# Patient Record
Sex: Male | Born: 1960 | Race: Black or African American | Hispanic: No | Smoking: Former smoker
Health system: Southern US, Community
[De-identification: ages and names within clinical notes are randomized; demographics above are authoritative.]

## PROBLEM LIST (undated history)

## (undated) DIAGNOSIS — I10 Essential (primary) hypertension: Secondary | ICD-10-CM

## (undated) DIAGNOSIS — M109 Gout, unspecified: Secondary | ICD-10-CM

## (undated) DIAGNOSIS — M869 Osteomyelitis, unspecified: Secondary | ICD-10-CM

## (undated) DIAGNOSIS — M1990 Unspecified osteoarthritis, unspecified site: Secondary | ICD-10-CM

## (undated) DIAGNOSIS — F4310 Post-traumatic stress disorder, unspecified: Secondary | ICD-10-CM

## (undated) DIAGNOSIS — Z9289 Personal history of other medical treatment: Secondary | ICD-10-CM

## (undated) DIAGNOSIS — G629 Polyneuropathy, unspecified: Secondary | ICD-10-CM

## (undated) DIAGNOSIS — E785 Hyperlipidemia, unspecified: Secondary | ICD-10-CM

## (undated) HISTORY — PX: JOINT REPLACEMENT: SHX530

## (undated) HISTORY — PX: FRACTURE SURGERY: SHX138

## (undated) HISTORY — PX: DG FOOT HEEL (ARMC HX): HXRAD1517

---

## 1982-08-04 DIAGNOSIS — Z9289 Personal history of other medical treatment: Secondary | ICD-10-CM

## 1982-08-04 HISTORY — DX: Personal history of other medical treatment: Z92.89

## 1983-01-03 HISTORY — PX: ENUCLEATION: SHX628

## 2004-04-20 ENCOUNTER — Emergency Department (HOSPITAL_COMMUNITY): Admission: EM | Admit: 2004-04-20 | Discharge: 2004-04-20 | Payer: Self-pay | Admitting: Emergency Medicine

## 2008-12-08 ENCOUNTER — Emergency Department (HOSPITAL_COMMUNITY): Admission: EM | Admit: 2008-12-08 | Discharge: 2008-12-08 | Payer: Self-pay | Admitting: Emergency Medicine

## 2013-10-04 ENCOUNTER — Other Ambulatory Visit (HOSPITAL_COMMUNITY): Payer: Self-pay | Admitting: *Deleted

## 2013-10-04 ENCOUNTER — Ambulatory Visit (HOSPITAL_COMMUNITY)
Admission: RE | Admit: 2013-10-04 | Discharge: 2013-10-04 | Disposition: A | Discharge status: Home / Self Care | Payer: Medicaid Other | Source: Ambulatory Visit | Attending: *Deleted | Admitting: *Deleted

## 2013-10-04 DIAGNOSIS — M25551 Pain in right hip: Secondary | ICD-10-CM

## 2013-10-04 DIAGNOSIS — M169 Osteoarthritis of hip, unspecified: Secondary | ICD-10-CM | POA: Insufficient documentation

## 2013-10-04 DIAGNOSIS — M1610 Unilateral primary osteoarthritis, unspecified hip: Secondary | ICD-10-CM | POA: Insufficient documentation

## 2014-04-27 ENCOUNTER — Encounter (HOSPITAL_COMMUNITY): Payer: Self-pay | Admitting: Emergency Medicine

## 2014-04-27 ENCOUNTER — Emergency Department (HOSPITAL_COMMUNITY)
Admission: EM | Admit: 2014-04-27 | Discharge: 2014-04-27 | Disposition: A | Discharge status: Home / Self Care | Payer: Medicaid Other | Attending: Emergency Medicine | Admitting: Emergency Medicine

## 2014-04-27 DIAGNOSIS — Z792 Long term (current) use of antibiotics: Secondary | ICD-10-CM | POA: Diagnosis not present

## 2014-04-27 DIAGNOSIS — Z8659 Personal history of other mental and behavioral disorders: Secondary | ICD-10-CM | POA: Insufficient documentation

## 2014-04-27 DIAGNOSIS — M129 Arthropathy, unspecified: Secondary | ICD-10-CM | POA: Insufficient documentation

## 2014-04-27 DIAGNOSIS — E860 Dehydration: Secondary | ICD-10-CM | POA: Insufficient documentation

## 2014-04-27 DIAGNOSIS — I10 Essential (primary) hypertension: Secondary | ICD-10-CM | POA: Diagnosis not present

## 2014-04-27 DIAGNOSIS — K089 Disorder of teeth and supporting structures, unspecified: Secondary | ICD-10-CM | POA: Diagnosis present

## 2014-04-27 DIAGNOSIS — K047 Periapical abscess without sinus: Secondary | ICD-10-CM | POA: Insufficient documentation

## 2014-04-27 DIAGNOSIS — Z79899 Other long term (current) drug therapy: Secondary | ICD-10-CM | POA: Insufficient documentation

## 2014-04-27 DIAGNOSIS — F17200 Nicotine dependence, unspecified, uncomplicated: Secondary | ICD-10-CM | POA: Diagnosis not present

## 2014-04-27 DIAGNOSIS — E785 Hyperlipidemia, unspecified: Secondary | ICD-10-CM | POA: Insufficient documentation

## 2014-04-27 HISTORY — DX: Unspecified osteoarthritis, unspecified site: M19.90

## 2014-04-27 LAB — BASIC METABOLIC PANEL
Anion gap: 15 (ref 5–15)
BUN: 16 mg/dL (ref 6–23)
CALCIUM: 9.5 mg/dL (ref 8.4–10.5)
CO2: 29 mEq/L (ref 19–32)
CREATININE: 1.44 mg/dL — AB (ref 0.50–1.35)
Chloride: 92 mEq/L — ABNORMAL LOW (ref 96–112)
GFR calc non Af Amer: 54 mL/min — ABNORMAL LOW (ref >90)
GFR, EST AFRICAN AMERICAN: 63 mL/min — AB (ref >90)
Glucose, Bld: 89 mg/dL (ref 70–99)
POTASSIUM: 3.5 meq/L — AB (ref 3.7–5.3)
SODIUM: 136 meq/L — AB (ref 137–147)

## 2014-04-27 LAB — CBC WITH DIFFERENTIAL/PLATELET
Basophils Absolute: 0 10*3/uL (ref 0.0–0.1)
Basophils Relative: 0 % (ref 0–1)
EOS ABS: 0 10*3/uL (ref 0.0–0.7)
EOS PCT: 0 % (ref 0–5)
HCT: 35.5 % — ABNORMAL LOW (ref 39.0–52.0)
Hemoglobin: 12.9 g/dL — ABNORMAL LOW (ref 13.0–17.0)
LYMPHS ABS: 1.6 10*3/uL (ref 0.7–4.0)
LYMPHS PCT: 8 % — AB (ref 12–46)
MCH: 32.3 pg (ref 26.0–34.0)
MCHC: 36.3 g/dL — AB (ref 30.0–36.0)
MCV: 89 fL (ref 78.0–100.0)
Monocytes Absolute: 2.1 10*3/uL — ABNORMAL HIGH (ref 0.1–1.0)
Monocytes Relative: 10 % (ref 3–12)
NEUTROS ABS: 17.6 10*3/uL — AB (ref 1.7–7.7)
NEUTROS PCT: 82 % — AB (ref 43–77)
Platelets: 209 10*3/uL (ref 150–400)
RBC: 3.99 MIL/uL — ABNORMAL LOW (ref 4.22–5.81)
RDW: 12.4 % (ref 11.5–15.5)
WBC: 21.4 10*3/uL — AB (ref 4.0–10.5)

## 2014-04-27 LAB — LACTIC ACID, PLASMA: Lactic Acid, Venous: 0.9 mmol/L (ref 0.5–2.2)

## 2014-04-27 MED ORDER — HYDROCODONE-ACETAMINOPHEN 5-325 MG PO TABS: 1.0000 | ORAL_TABLET | Freq: Four times a day (QID) | ORAL | Status: DC | PRN

## 2014-04-27 MED ORDER — CLINDAMYCIN PHOSPHATE 900 MG/50ML IV SOLN
900.0000 mg | Freq: Once | INTRAVENOUS | Status: AC
  Administered 2014-04-27: 900 mg via INTRAVENOUS
  Filled 2014-04-27: qty 50

## 2014-04-27 MED ORDER — FENTANYL CITRATE 0.05 MG/ML IJ SOLN
50.0000 ug | Freq: Once | INTRAMUSCULAR | Status: AC
  Administered 2014-04-27: 50 ug via INTRAVENOUS
  Filled 2014-04-27: qty 2

## 2014-04-27 MED ORDER — ONDANSETRON HCL 4 MG/2ML IJ SOLN
4.0000 mg | Freq: Once | INTRAMUSCULAR | Status: AC
  Administered 2014-04-27: 4 mg via INTRAVENOUS
  Filled 2014-04-27: qty 2

## 2014-04-27 MED ORDER — SODIUM CHLORIDE 0.9 % IV SOLN
1000.0000 mL | INTRAVENOUS | Status: DC
  Administered 2014-04-27: 1000 mL via INTRAVENOUS

## 2014-04-27 MED ORDER — CLINDAMYCIN HCL 300 MG PO CAPS: 300.0000 mg | ORAL_CAPSULE | Freq: Four times a day (QID) | ORAL | Status: DC

## 2014-04-27 MED ORDER — SODIUM CHLORIDE 0.9 % IV BOLUS (SEPSIS): 1000.0000 mL | Freq: Once | INTRAVENOUS | Status: DC

## 2014-04-27 MED ORDER — SODIUM CHLORIDE 0.9 % IV SOLN
1000.0000 mL | Freq: Once | INTRAVENOUS | Status: AC
  Administered 2014-04-27: 1000 mL via INTRAVENOUS

## 2014-04-27 MED ORDER — SODIUM CHLORIDE 0.9 % IV BOLUS (SEPSIS)
1000.0000 mL | Freq: Once | INTRAVENOUS | Status: AC
  Administered 2014-04-27: 1000 mL via INTRAVENOUS

## 2014-04-27 NOTE — ED Provider Notes (Addendum)
CSN: [id number]     Arrival date & time 04/27/14  1445 History   First MD Initiated Contact with Patient 04/27/14 1506     Chief Complaint  Patient presents with  . Wound Infection  . Dental Pain     (Consider location/radiation/quality/duration/timing/severity/associated sxs/prior Treatment) HPI Patient reports he started having problems with his right lower tooth 5 days ago. He relates 4 days ago he started getting more painful and he had swelling up in his cheek area that has gotten progressively worse. He states today the swelling is more down around his jaw line. He has had chills without documented fever. He states it's painful to swallow however he has no trouble breathing. He reports difficulty opening up his mouth and chewing. He states he went to see his dentist today, Dr Judkins and was told to come to the ED to get IV antibiotics because he had poison in his system.  PCP [facility]  Past Medical History  Diagnosis Date  . Arthritis    PTSD Hypertension High cholesterol   Past Surgical History  Procedure Laterality Date  . Eye surgery      pt had right eye removed   No family history on file. History  Substance Use Topics  . Smoking status: Current Every Day Smoker -- 0.50 packs/day    Types: Cigarettes  . Smokeless tobacco: Never Used  . Alcohol Use: No  employed Smokes 7 cigs a day  Review of Systems  All other systems reviewed and are negative.     Allergies  Review of patient's allergies indicates no known allergies.  Home Medications   Prior to Admission medications   Medication Sig Start Date End Date Taking? Authorizing Provider  doxepin (SINEQUAN) 25 MG capsule Take 25 mg by mouth at bedtime.   Yes Historical Provider, MD  ibuprofen (ADVIL,MOTRIN) 800 MG tablet Take 800 mg by mouth every 8 (eight) hours as needed for headache or moderate pain.   Yes Historical Provider, MD  lisinopril-hydrochlorothiazide  (PRINZIDE,ZESTORETIC) 20-12.5 MG per tablet Take 1 tablet by mouth daily.   Yes Historical Provider, MD  pravastatin (PRAVACHOL) 20 MG tablet Take 20 mg by mouth at bedtime.   Yes Historical Provider, MD  clindamycin (CLEOCIN) 300 MG capsule Take 1 capsule (300 mg total) by mouth 4 (four) times daily. 04/27/14   Ward Givens, MD  HYDROcodone-acetaminophen (NORCO/VICODIN) 5-325 MG per tablet Take 1-2 tablets by mouth every 6 (six) hours as needed for moderate pain. 04/27/14   Ward Givens, MD   BP 94/61  Pulse 112  Temp(Src) 99.3 F (37.4 C) (Oral)  Resp 18  Ht 6' (1.829 m)  Wt 243 lb (110.224 kg)  BMI 32.95 kg/m2  SpO2 98%  Vital signs normal except for tachycardia and mild hypotension  Orthostatic Vital Signs after first liter of NS Orthostatic Lying - BP- Lying: 114/77 mmHg ; Pulse- Lying: 104  Orthostatic Sitting - BP- Sitting: 110/88 mmHg ; Pulse- Sitting: 105  Orthostatic Standing at 0 minutes - BP- Standing at 0 minutes: 106/84 mmHg ; Pulse- Standing at 0 minutes: 110   Physical Exam  Nursing note and vitals reviewed. Constitutional: He is oriented to person, place, and time. He appears well-developed and well-nourished.  Non-toxic appearance. He does not appear ill. No distress.  HENT:  Head: Normocephalic and atraumatic.  Right Ear: External ear normal.  Left Ear: External ear normal.  Nose: Nose normal. No mucosal edema or rhinorrhea.  Mouth/Throat:  Mucous membranes are normal. No dental abscesses or uvula swelling.  Patient is unable to open his mouth widely, he is noted to have a large cavity of his right lower most posterior molar that has some mild tenderness to percussion. Patient's noted to have swelling of his right jaw line extending into the upper submandibular/neck area. He is not drooling, he is able to swallow his own saliva. His voice is normal.  Eyes: Conjunctivae are normal.  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 Neck supple.  Cardiovascular: Normal rate, regular rhythm and normal heart sounds.  Exam reveals no gallop and no friction rub.   No murmur heard. Pulmonary/Chest: Effort normal and breath sounds normal. No respiratory distress. He has no wheezes. He has no rhonchi. He has no rales. He exhibits no tenderness and no crepitus.  Abdominal: Soft. Normal appearance and bowel sounds are normal. He exhibits no distension. There is no tenderness. There is no rebound and no guarding.  Musculoskeletal: Normal range of motion. He exhibits no edema and no tenderness.  Moves all extremities well.   Neurological: He is alert and oriented to person, place, and time. He has normal strength. No cranial nerve deficit.  Skin: Skin is warm, dry and intact. No rash noted. No erythema. No pallor.  Psychiatric: He has a normal mood and affect. His speech is normal and behavior is normal. His mood appears not anxious.    ED Course  Procedures (including critical care time)  Medications  sodium chloride 0.9 % bolus 1,000 mL (1,000 mLs Intravenous Not Given 04/27/14 2015)  0.9 %  sodium chloride infusion (0 mLs Intravenous Stopped 04/27/14 1800)  fentaNYL (SUBLIMAZE) injection 50 mcg (50 mcg Intravenous Given 04/27/14 1625)  ondansetron (ZOFRAN) injection 4 mg (4 mg Intravenous Given 04/27/14 1625)  clindamycin (CLEOCIN) IVPB 900 mg (0 mg Intravenous Stopped 04/27/14 1655)  sodium chloride 0.9 % bolus 1,000 mL (0 mLs Intravenous Stopped 04/27/14 1904)  fentaNYL (SUBLIMAZE) injection 50 mcg (50 mcg Intravenous Given 04/27/14 2023)   Patient received IV fluids until his tachycardia and hypotension improved. Patient was able to eat and drink while in the ED.  Labs Review Results for orders placed during the hospital encounter of 04/27/14  CBC WITH DIFFERENTIAL      Result Value Ref Range   WBC 21.4 (*) 4.0 - 10.5 K/uL   RBC 3.99 (*) 4.22 - 5.81 MIL/uL   Hemoglobin 12.9 (*) 13.0 - 17.0 g/dL    HCT 16.1 (*) 09.6 - 52.0 %   MCV 89.0  78.0 - 100.0 fL   MCH 32.3  26.0 - 34.0 pg   MCHC 36.3 (*) 30.0 - 36.0 g/dL   RDW 04.5  40.9 - 81.1 %   Platelets 209  150 - 400 K/uL   Neutrophils Relative % 82 (*) 43 - 77 %   Neutro Abs 17.6 (*) 1.7 - 7.7 K/uL   Lymphocytes Relative 8 (*) 12 - 46 %   Lymphs Abs 1.6  0.7 - 4.0 K/uL   Monocytes Relative 10  3 - 12 %   Monocytes Absolute 2.1 (*) 0.1 - 1.0 K/uL   Eosinophils Relative 0  0 - 5 %   Eosinophils Absolute 0.0  0.0 - 0.7 K/uL   Basophils Relative 0  0 - 1 %   Basophils Absolute 0.0  0.0 - 0.1 K/uL  BASIC METABOLIC PANEL  Result Value Ref Range   Sodium 136 (*) 137 - 147 mEq/L   Potassium 3.5 (*) 3.7 - 5.3 mEq/L   Chloride 92 (*) 96 - 112 mEq/L   CO2 29  19 - 32 mEq/L   Glucose, Bld 89  70 - 99 mg/dL   BUN 16  6 - 23 mg/dL   Creatinine, Ser 1.61 (*) 0.50 - 1.35 mg/dL   Calcium 9.5  8.4 - 09.6 mg/dL   GFR calc non Af Amer 54 (*) >90 mL/min   GFR calc Af Amer 63 (*) >90 mL/min   Anion gap 15  5 - 15  LACTIC ACID, PLASMA      Result Value Ref Range   Lactic Acid, Venous 0.9  0.5 - 2.2 mmol/L   Laboratory interpretation all normal except leukocytosis, mild hypokalemia, low sodium and chloride consistent with dehydration      Imaging Review No results found.   EKG Interpretation None      MDM   Final diagnoses:  Abscessed tooth  Dehydration   New Prescriptions   CLINDAMYCIN (CLEOCIN) 300 MG CAPSULE    Take 1 capsule (300 mg total) by mouth 4 (four) times daily.   HYDROCODONE-ACETAMINOPHEN (NORCO/VICODIN) 5-325 MG PER TABLET    Take 1-2 tablets by mouth every 6 (six) hours as needed for moderate pain.    Plan discharge   I personally performed the services described in this documentation, which was scribed in my presence. The recorded information has been reviewed and considered.     Ward Givens, MD 04/27/14 2022  Ward Givens, MD 04/27/14 2138

## 2014-04-27 NOTE — Discharge Instructions (Signed)
Try warmth on your face and ice packs for pain. Take the hydrocodone for pain. Take the antibotics until gone. Return to the ED if your swelling gets worse, you have difficulty breathing or are unable to swallow. Follow up with your dentist after the antibiotics are finished to have definitive treatment of your bad tooth.    Dental Abscess A dental abscess is a collection of infected fluid (pus) from a bacterial infection in the inner part of the tooth (pulp). It usually occurs at the end of the tooth's root.  CAUSES   Severe tooth decay.  Trauma to the tooth that allows bacteria to enter into the pulp, such as a broken or chipped tooth. SYMPTOMS   Severe pain in and around the infected tooth.  Swelling and redness around the abscessed tooth or in the mouth or face.  Tenderness.  Pus drainage.  Bad breath.  Bitter taste in the mouth.  Difficulty swallowing.  Difficulty opening the mouth.  Nausea.  Vomiting.  Chills.  Swollen neck glands. DIAGNOSIS   A medical and dental history will be taken.  An examination will be performed by tapping on the abscessed tooth.  X-rays may be taken of the tooth to identify the abscess. TREATMENT The goal of treatment is to eliminate the infection. You may be prescribed antibiotic medicine to stop the infection from spreading. A root canal may be performed to save the tooth. If the tooth cannot be saved, it may be pulled (extracted) and the abscess may be drained.  HOME CARE INSTRUCTIONS  Only take over-the-counter or prescription medicines for pain, fever, or discomfort as directed by your caregiver.  Rinse your mouth (gargle) often with salt water ( tsp salt in 8 oz [250 ml] of warm water) to relieve pain or swelling.  Do not drive after taking pain medicine (narcotics).  Do not apply heat to the outside of your face.  Return to your dentist for further treatment as directed. SEEK MEDICAL CARE IF:  Your pain is not helped by  medicine.  Your pain is getting worse instead of better. SEEK IMMEDIATE MEDICAL CARE IF:  You have a fever or persistent symptoms for more than 2-3 days.  You have a fever and your symptoms suddenly get worse.  You have chills or a very bad headache.  You have problems breathing or swallowing.  You have trouble opening your mouth.  You have swelling in the neck or around the eye. Document Released: 07/21/2005 Document Revised: 04/14/2012 Document Reviewed: 10/29/2010 [facility] Patient Information 2015 [place], [place]. This information is not intended to replace advice given to you by your health care provider. Make sure you discuss any questions you have with your health care provider.

## 2014-04-27 NOTE — ED Notes (Signed)
Pt states that pain and swelling in mouth started on Monday evening. Pt. Stated that he went to the dentist today at 1421 and requested that he come to the emergency department to get antibiotics in a "drip".

## 2014-04-27 NOTE — ED Notes (Signed)
Pt was given a six pre-pack Vicodin 5-325 mg, given instructions on use, pt verbally understands.

## 2014-05-02 MED FILL — Hydrocodone-Acetaminophen Tab 5-325 MG: ORAL | Qty: 6 | Status: AC

## 2014-08-04 HISTORY — PX: TOTAL HIP ARTHROPLASTY: SHX124

## 2017-01-20 ENCOUNTER — Emergency Department (HOSPITAL_COMMUNITY): Payer: Medicaid Other

## 2017-01-20 ENCOUNTER — Encounter (HOSPITAL_COMMUNITY): Payer: Self-pay

## 2017-01-20 ENCOUNTER — Emergency Department (HOSPITAL_COMMUNITY)
Admission: EM | Admit: 2017-01-20 | Discharge: 2017-01-21 | Disposition: A | Discharge status: Home / Self Care | Payer: Medicaid Other | Attending: Emergency Medicine | Admitting: Emergency Medicine

## 2017-01-20 DIAGNOSIS — Z791 Long term (current) use of non-steroidal anti-inflammatories (NSAID): Secondary | ICD-10-CM | POA: Insufficient documentation

## 2017-01-20 DIAGNOSIS — W1789XA Other fall from one level to another, initial encounter: Secondary | ICD-10-CM | POA: Insufficient documentation

## 2017-01-20 DIAGNOSIS — F17210 Nicotine dependence, cigarettes, uncomplicated: Secondary | ICD-10-CM | POA: Diagnosis not present

## 2017-01-20 DIAGNOSIS — Y999 Unspecified external cause status: Secondary | ICD-10-CM | POA: Diagnosis not present

## 2017-01-20 DIAGNOSIS — S92014A Nondisplaced fracture of body of right calcaneus, initial encounter for closed fracture: Secondary | ICD-10-CM | POA: Insufficient documentation

## 2017-01-20 DIAGNOSIS — Z7982 Long term (current) use of aspirin: Secondary | ICD-10-CM | POA: Insufficient documentation

## 2017-01-20 DIAGNOSIS — Y939 Activity, unspecified: Secondary | ICD-10-CM | POA: Diagnosis not present

## 2017-01-20 DIAGNOSIS — S92001A Unspecified fracture of right calcaneus, initial encounter for closed fracture: Secondary | ICD-10-CM

## 2017-01-20 DIAGNOSIS — Z79899 Other long term (current) drug therapy: Secondary | ICD-10-CM | POA: Diagnosis not present

## 2017-01-20 DIAGNOSIS — Y929 Unspecified place or not applicable: Secondary | ICD-10-CM | POA: Insufficient documentation

## 2017-01-20 DIAGNOSIS — S99911A Unspecified injury of right ankle, initial encounter: Secondary | ICD-10-CM | POA: Diagnosis present

## 2017-01-20 NOTE — ED Triage Notes (Signed)
Reports of falling off porch this evening. Right ankle pain/swelling.

## 2017-01-21 ENCOUNTER — Telehealth: Payer: Self-pay | Admitting: Orthopaedic Surgery

## 2017-01-21 MED ORDER — HYDROCODONE-ACETAMINOPHEN 5-325 MG PO TABS: 2.0000 | ORAL_TABLET | ORAL | 0 refills | Status: DC | PRN

## 2017-01-21 MED ORDER — HYDROCODONE-ACETAMINOPHEN 5-325 MG PO TABS
1.0000 | ORAL_TABLET | Freq: Once | ORAL | Status: AC
  Administered 2017-01-21: 1 via ORAL
  Filled 2017-01-21: qty 1

## 2017-01-21 NOTE — Discharge Instructions (Signed)
Take pain medication as needed. Use splint as directed. Use crutches as directed. Follow-up with orthopedics for further evaluation use information listed below. Return to ED for worsening pain, additional injury, numbness, weakness, headache injury, fever.

## 2017-01-21 NOTE — ED Provider Notes (Signed)
[facility]EM[facility]Y DEPT Provider Note   CSN: [id number] Arrival date & time: 01/20/17  2148     History   Chief Complaint Chief Complaint  Patient presents with  . Ankle Pain    HPI Charles Blankenship is a 56 y.o. male.  HPI  Patient presents to ED for complaints of right foot pain that began about 4 hours PTA. He states that he tripped off of his porch and twisted his right ankle and landed on his feet. Since then he has pain with weightbearing, increased swelling. States the pain is on his ankle and bilateral of his foot. States that ibuprofen has not helped with pain. He denies any previous injury or procedure in this foot. He denies any back pain, back injury, urinary incontinence, numbness, weakness, temperature changes, head injury, headache, vision changes, chest pain or trouble breathing.  Past Medical History:  Diagnosis Date  . Arthritis     There are no active problems to display for this patient.   Past Surgical History:  Procedure Laterality Date  . EYE SURGERY     pt had right eye removed       Home Medications    Prior to Admission medications   Medication Sig Start Date End Date Taking? Authorizing Provider  aspirin EC 81 MG tablet Take 81 mg by mouth daily.   Yes [provider]  doxepin (SINEQUAN) 25 MG capsule Take 25 mg by mouth at bedtime.   Yes [provider]  ibuprofen (ADVIL,MOTRIN) 800 MG tablet Take 800 mg by mouth every 8 (eight) hours as needed for headache or moderate pain.   Yes [provider]  lisinopril-hydrochlorothiazide (PRINZIDE,ZESTORETIC) 20-12.5 MG per tablet Take 1 tablet by mouth daily.   Yes [provider]  Multiple Vitamin (MULTIVITAMIN WITH MINERALS) TABS tablet Take 1 tablet by mouth daily.   Yes [provider]  pravastatin (PRAVACHOL) 20 MG tablet Take 20 mg by mouth at bedtime.   Yes [provider]  vitamin B-12 (CYANOCOBALAMIN) 500 MCG tablet Take 500 mcg by mouth  daily.   Yes [provider]  HYDROcodone-acetaminophen (NORCO/VICODIN) 5-325 MG tablet Take 2 tablets by mouth every 4 (four) hours as needed. 01/21/17   Dietrich PatesKhatri, Melanie Openshaw, PA-C    Family History No family history on file.  Social History Social History  Substance Use Topics  . Smoking status: Current Every Day Smoker    Packs/day: 0.50    Types: Cigarettes  . Smokeless tobacco: Never Used  . Alcohol use No     Allergies   Patient has no known allergies.   Review of Systems Review of Systems  Constitutional: Negative for chills and fever.  Eyes: Negative for photophobia and visual disturbance.  Respiratory: Negative for shortness of breath.   Cardiovascular: Negative for chest pain.  Gastrointestinal: Negative for abdominal pain, diarrhea, nausea and vomiting.  Musculoskeletal: Positive for arthralgias and myalgias. Negative for back pain, gait problem and neck pain.  Skin: Negative for rash and wound.  Neurological: Negative for dizziness, syncope, weakness, light-headedness and headaches.     Physical Exam Updated Vital Signs BP (!) 158/98 (BP Location: Right Arm)   Pulse 96   Temp 98.9 F (37.2 C) (Oral)   Resp 17   Ht 6' (1.829 m)   Wt 105.2 kg (232 lb)   SpO2 98%   BMI 31.46 kg/m   Physical Exam  Constitutional: He appears well-developed and well-nourished. No distress.  HENT:  Head: Normocephalic and atraumatic.  Eyes: Conjunctivae  and EOM are normal. No scleral icterus.  Neck: Normal range of motion.  Pulmonary/Chest: Effort normal. No respiratory distress.  Musculoskeletal: He exhibits edema and tenderness. He exhibits no deformity.  Tenderness over the right heel, medial ankle and great MTP joint. There is significant edema noted throughout the foot and ankle. 2+ DP pulses bilaterally. Sensation intact to light touch. There is no calf tenderness present. No temperature change or visible wound noted. No palpable deformity noted. Pain with inversion  and eversion of the ankle. Patient able to move all toes.  No lumbar or thoracic midline spinal tenderness present. No visible bruising or step-off palpated.  Neurological: He is alert.  Skin: No rash noted. He is not diaphoretic.  Psychiatric: He has a normal mood and affect.  Nursing note and vitals reviewed.    ED Treatments / Results  Labs (all labs ordered are listed, but only abnormal results are displayed) Labs Reviewed - No data to display  EKG  EKG Interpretation None       Radiology Dg Ankle Complete Right  Result Date: 01/20/2017 CLINICAL DATA:  Twisted ankle tonight.  Fell off porch. EXAM: RIGHT ANKLE - COMPLETE 3+ VIEW COMPARISON:  None. FINDINGS: Acute comminuted calcaneal fracture with intra-articular extension and mild impaction. Ankle mortise appears contour and tibiofibular syndesmosis intact. Tiny corticated bony fragment inferior to the medial malleolus. No destructive bony lesions. Soft tissue swelling without subcutaneous gas or radiopaque foreign bodies. IMPRESSION: Acute comminuted calcaneal fracture. Age indeterminate medial malleolar avulsion fracture. Electronically Signed   By: Awilda Metro M.D.   On: 01/20/2017 22:34   Dg Foot Complete Right  Result Date: 01/21/2017 CLINICAL DATA:  Calcaneal fracture noted on ankle radiographs. Further evaluation requested. Right medial ankle pain and swelling. Status post fall off porch. Initial encounter. EXAM: RIGHT FOOT COMPLETE - 3+ VIEW COMPARISON:  None. FINDINGS: There is a comminuted tongue-type fracture of the calcaneus, with disruption of the subtalar joint. No additional fractures are seen. There is chronic deformity of the second metatarsal. Mild soft tissue swelling is noted about the heel. IMPRESSION: Comminuted tongue-type fracture of the calcaneus, with underlying disruption of the subtalar joint. Electronically Signed   By: Roanna Raider M.D.   On: 01/21/2017 00:24    Procedures Procedures  (including critical care time)  Medications Ordered in ED Medications  HYDROcodone-acetaminophen (NORCO/VICODIN) 5-325 MG per tablet 1 tablet (1 tablet Oral Given 01/21/17 0051)     Initial Impression / Assessment and Plan / ED Course  I have reviewed the triage vital signs and the nursing notes.  Pertinent labs & imaging results that were available during my care of the patient were reviewed by me and considered in my medical decision making (see chart for details).     Patient presents to ED for right ankle and heel pain that began after twisting his ankle after tripping off of his porch. He states that he has had increased pain and swelling since incident occurred. On physical exam his foot has significant edema and tenderness over the heel, ankle and first MTP joint. X-ray of the foot and ankle were obtained and showed acute comminuted calcaneal fracture. This is a closed fracture. There are no visible signs of bruising or temperature change noted. No calf tenderness that would suggest DVT. No concern for septic joint. Patient is afebrile with no history of fever. He has pain with range of motion of the ankle. Pulses intact. Sensation intact to light touch. Area appears neurovascularly intact. Patient does  not complain of back pain and denies any back injury. There is no midline lumbar or thoracic spine tenderness and no visible bruising present. No need for imaging of the back at this time. No objective signs of numbness noted and patient denies numbness, bladder incontinence. Pain control here in the ED. Will give posterior splint for support and pain medication. I advised follow-up with orthopedics for further evaluation. Strict return precautions given.  Final Clinical Impressions(s) / ED Diagnoses   Final diagnoses:  Closed nondisplaced fracture of right calcaneus, unspecified portion of calcaneus, initial encounter    New Prescriptions New Prescriptions   HYDROCODONE-ACETAMINOPHEN  (NORCO/VICODIN) 5-325 MG TABLET    Take 2 tablets by mouth every 4 (four) hours as needed.     Dietrich Pates, PA-C 01/21/17 0132    Zadie Rhine, MD 01/21/17 [phone]

## 2017-01-21 NOTE — Telephone Encounter (Signed)
Patient and wife called this morning following [facility] yesterday, 01/20/17, for problem of foot/ankle fractures. Per chart notes - Xrays indicate comminuted tongue-type fracture of the calcaneus with disruption of the subtalar joint, foot; and acute comminuted calcaneal fracture w/ intra-articular extension, right ankle.  Per our provider Dr Hilda LiasKeeling, recommends patient referral directly to orthopaedic foot/ankle surgeon.  Relayed this information to patient. Voices understanding. Patient is also [facility], 3:30p.m, in order to have referral made to specialist (foot/ankle surgeon) as per insurance requirement.

## 2017-01-22 ENCOUNTER — Encounter (INDEPENDENT_AMBULATORY_CARE_PROVIDER_SITE_OTHER): Payer: Self-pay | Admitting: Orthopedic Surgery

## 2017-01-22 ENCOUNTER — Ambulatory Visit (INDEPENDENT_AMBULATORY_CARE_PROVIDER_SITE_OTHER): Payer: Medicaid Other | Admitting: Orthopedic Surgery

## 2017-01-22 ENCOUNTER — Encounter (HOSPITAL_COMMUNITY): Payer: Self-pay | Admitting: *Deleted

## 2017-01-22 ENCOUNTER — Other Ambulatory Visit (INDEPENDENT_AMBULATORY_CARE_PROVIDER_SITE_OTHER): Payer: Self-pay | Admitting: Orthopedic Surgery

## 2017-01-22 VITALS — Ht 72.0 in | Wt 232.0 lb

## 2017-01-22 DIAGNOSIS — S92011A Displaced fracture of body of right calcaneus, initial encounter for closed fracture: Secondary | ICD-10-CM | POA: Diagnosis not present

## 2017-01-22 MED ORDER — OXYCODONE-ACETAMINOPHEN 5-325 MG PO TABS: 1.0000 | ORAL_TABLET | ORAL | 0 refills | Status: DC | PRN

## 2017-01-22 NOTE — Progress Notes (Addendum)
Office Visit Note   Patient: Charles Blankenship           Date of Birth: Dec 23, 1960           MRN: [id number] Visit Date: 01/22/2017              Requested by: Tylene Fantasia., PA-C [address] [place], [place] [address] PCP: Tylene Fantasia., PA-C  Chief Complaint  Patient presents with  . Right Foot - Injury    DOI 01/20/17 to ER post twist injury comminuted right calcaneal fracture.       HPI: Patient is a 56 year old gentleman who states he fell from a ladder approximately 8 feet up and landed directly on his right heel. Patient complains of immediate onset of pain he was seen in the emergency room on 01/20/2017 radiographs were obtained and patient is seen today for initial evaluation.  Of note patient does smoke negative history for diabetes. Patient also has high cholesterol hypertension and gout.  Assessment & Plan: Visit Diagnoses:  1. 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 Discussed risks and benefits of surgery with the risks including infection neurovascular injury numbness arthritis need for additional surgery. Discussed the absolute need to stop smoking today. Discussed that with smoking he is at increased risk of wound dehiscence and an increased risk of a below knee amputation with amputation of his foot. Discussed the importance of elevation of his foot level with his heart using ice to decrease swelling recommended against nicotine products. Patient states he understands and wishes to proceed with surgery.  Follow-Up Instructions: Return in about 1 week (around 01/29/2017).   Ortho Exam  Patient is alert, oriented, no adenopathy, well-dressed, normal affect, normal respiratory effort. Examination patient is nonweightbearing on  the right. He does have swelling and ecchymosis and bruising laterally. No signs of a compartment syndrome. He has a palpable dorsalis pedis pulse. Radiographs are reviewed which shows a tong type fracture with complete displacement of the posterior facet. There is also involvement extends distally to the calcaneal cuboid joint.  Imaging: No results found.  Labs: No results found for: HGBA1C, ESRSEDRATE, CRP, LABURIC, REPTSTATUS, GRAMSTAIN, CULT, LABORGA  Orders:  No orders of the defined types were placed in this encounter.  Meds ordered this encounter  Medications  . oxyCODONE-acetaminophen (PERCOCET/ROXICET) 5-325 MG tablet    Sig: Take 1 tablet by mouth every 4 (four) hours as needed for severe pain.    Dispense:  60 tablet    Refill:  0     Procedures: No procedures performed  Clinical Data: No additional findings.  ROS:  All other systems negative, except as noted in the HPI. Review of Systems  Objective: Vital Signs: Ht 6' (1.829 m)   Wt 232 lb (105.2 kg)   BMI 31.46 kg/m   Specialty Comments:  No specialty comments available.  PMFS History: There are no active problems to display for this patient.  Past Medical History:  Diagnosis Date  . Arthritis     No family history on file.  Past Surgical History:  Procedure Laterality Date  . EYE SURGERY     pt had right eye removed   Social History   Occupational History  . Not on file.   Social History Main Topics  .  Smoking status: Current Every Day Smoker    Packs/day: 0.50    Types: Cigarettes  . Smokeless tobacco: Never Used  . Alcohol use No  . Drug use: No  . Sexual activity: Yes

## 2017-01-22 NOTE — Progress Notes (Signed)
Mr Charles Blankenship denies chest pain or shortness of breath.  PCP [place] Kizz[place] [facility]helle Muse , [place]  I requested last office note an EKG from the office.

## 2017-01-22 NOTE — Addendum Note (Signed)
Addended by: Aldean BakerUDA, MARCUS on: 01/22/2017 10:33 AM   Modules accepted: Orders

## 2017-01-23 ENCOUNTER — Ambulatory Visit (HOSPITAL_COMMUNITY): Payer: Medicaid Other | Admitting: Certified Registered"

## 2017-01-23 ENCOUNTER — Encounter (HOSPITAL_COMMUNITY): Payer: Self-pay | Admitting: *Deleted

## 2017-01-23 ENCOUNTER — Ambulatory Visit (HOSPITAL_COMMUNITY)
Admission: RE | Admit: 2017-01-23 | Discharge: 2017-01-23 | Disposition: A | Discharge status: Home / Self Care | Payer: Medicaid Other | Source: Ambulatory Visit | Attending: Orthopedic Surgery | Admitting: Orthopedic Surgery

## 2017-01-23 ENCOUNTER — Encounter (HOSPITAL_COMMUNITY)
Admission: RE | Disposition: A | Discharge status: Home / Self Care | Payer: Self-pay | Source: Ambulatory Visit | Attending: Orthopedic Surgery

## 2017-01-23 ENCOUNTER — Other Ambulatory Visit: Payer: Self-pay

## 2017-01-23 DIAGNOSIS — Z96641 Presence of right artificial hip joint: Secondary | ICD-10-CM | POA: Diagnosis not present

## 2017-01-23 DIAGNOSIS — S92011A Displaced fracture of body of right calcaneus, initial encounter for closed fracture: Secondary | ICD-10-CM

## 2017-01-23 DIAGNOSIS — Z79899 Other long term (current) drug therapy: Secondary | ICD-10-CM | POA: Diagnosis not present

## 2017-01-23 DIAGNOSIS — E785 Hyperlipidemia, unspecified: Secondary | ICD-10-CM | POA: Insufficient documentation

## 2017-01-23 DIAGNOSIS — W11XXXA Fall on and from ladder, initial encounter: Secondary | ICD-10-CM | POA: Diagnosis not present

## 2017-01-23 DIAGNOSIS — F17200 Nicotine dependence, unspecified, uncomplicated: Secondary | ICD-10-CM | POA: Insufficient documentation

## 2017-01-23 DIAGNOSIS — S92001A Unspecified fracture of right calcaneus, initial encounter for closed fracture: Secondary | ICD-10-CM | POA: Insufficient documentation

## 2017-01-23 DIAGNOSIS — I10 Essential (primary) hypertension: Secondary | ICD-10-CM | POA: Insufficient documentation

## 2017-01-23 HISTORY — DX: Hyperlipidemia, unspecified: E78.5

## 2017-01-23 HISTORY — DX: Essential (primary) hypertension: I10

## 2017-01-23 HISTORY — PX: ORIF CALCANEOUS FRACTURE: SHX5030

## 2017-01-23 HISTORY — DX: Gout, unspecified: M10.9

## 2017-01-23 LAB — COMPREHENSIVE METABOLIC PANEL
ALK PHOS: 72 U/L (ref 38–126)
ALT: 37 U/L (ref 17–63)
ANION GAP: 9 (ref 5–15)
AST: 40 U/L (ref 15–41)
Albumin: 4.1 g/dL (ref 3.5–5.0)
BUN: 11 mg/dL (ref 6–20)
CALCIUM: 9.4 mg/dL (ref 8.9–10.3)
CO2: 26 mmol/L (ref 22–32)
CREATININE: 1 mg/dL (ref 0.61–1.24)
Chloride: 102 mmol/L (ref 101–111)
Glucose, Bld: 104 mg/dL — ABNORMAL HIGH (ref 65–99)
Potassium: 3.5 mmol/L (ref 3.5–5.1)
SODIUM: 137 mmol/L (ref 135–145)
Total Bilirubin: 0.8 mg/dL (ref 0.3–1.2)
Total Protein: 6.7 g/dL (ref 6.5–8.1)

## 2017-01-23 LAB — CBC
HCT: 32.7 % — ABNORMAL LOW (ref 39.0–52.0)
Hemoglobin: 11.1 g/dL — ABNORMAL LOW (ref 13.0–17.0)
MCH: 28.5 pg (ref 26.0–34.0)
MCHC: 33.9 g/dL (ref 30.0–36.0)
MCV: 83.8 fL (ref 78.0–100.0)
Platelets: 209 10*3/uL (ref 150–400)
RBC: 3.9 MIL/uL — AB (ref 4.22–5.81)
RDW: 15.1 % (ref 11.5–15.5)
WBC: 7 10*3/uL (ref 4.0–10.5)

## 2017-01-23 LAB — APTT: aPTT: 32 seconds (ref 24–36)

## 2017-01-23 LAB — PROTIME-INR
INR: 0.98
PROTHROMBIN TIME: 13 s (ref 11.4–15.2)

## 2017-01-23 SURGERY — OPEN REDUCTION INTERNAL FIXATION (ORIF) CALCANEOUS FRACTURE
Anesthesia: General | Laterality: Right

## 2017-01-23 MED ORDER — MIDAZOLAM HCL 2 MG/2ML IJ SOLN
2.0000 mg | Freq: Once | INTRAMUSCULAR | Status: AC
  Administered 2017-01-23: 2 mg via INTRAVENOUS
  Filled 2017-01-23: qty 2

## 2017-01-23 MED ORDER — 0.9 % SODIUM CHLORIDE (POUR BTL) OPTIME
TOPICAL | Status: DC | PRN
  Administered 2017-01-23: 1000 mL

## 2017-01-23 MED ORDER — PHENYLEPHRINE 40 MCG/ML (10ML) SYRINGE FOR IV PUSH (FOR BLOOD PRESSURE SUPPORT)
PREFILLED_SYRINGE | INTRAVENOUS | Status: AC
  Filled 2017-01-23: qty 10

## 2017-01-23 MED ORDER — CHLORHEXIDINE GLUCONATE 4 % EX LIQD: 60.0000 mL | Freq: Once | CUTANEOUS | Status: DC

## 2017-01-23 MED ORDER — PROMETHAZINE HCL 25 MG/ML IJ SOLN: 6.2500 mg | INTRAMUSCULAR | Status: DC | PRN

## 2017-01-23 MED ORDER — LACTATED RINGERS IV SOLN
INTRAVENOUS | Status: DC
  Administered 2017-01-23 (×2): via INTRAVENOUS

## 2017-01-23 MED ORDER — SUGAMMADEX SODIUM 200 MG/2ML IV SOLN
INTRAVENOUS | Status: DC | PRN
  Administered 2017-01-23: 200 mg via INTRAVENOUS

## 2017-01-23 MED ORDER — PHENYLEPHRINE 40 MCG/ML (10ML) SYRINGE FOR IV PUSH (FOR BLOOD PRESSURE SUPPORT)
PREFILLED_SYRINGE | INTRAVENOUS | Status: AC
Start: 2017-01-23 — End: 2017-01-23
  Filled 2017-01-23: qty 10

## 2017-01-23 MED ORDER — EPHEDRINE SULFATE 50 MG/ML IJ SOLN
INTRAMUSCULAR | Status: DC | PRN
  Administered 2017-01-23: 5 mg via INTRAVENOUS
  Administered 2017-01-23: 15 mg via INTRAVENOUS
  Administered 2017-01-23: 5 mg via INTRAVENOUS

## 2017-01-23 MED ORDER — PHENYLEPHRINE 40 MCG/ML (10ML) SYRINGE FOR IV PUSH (FOR BLOOD PRESSURE SUPPORT)
PREFILLED_SYRINGE | INTRAVENOUS | Status: DC | PRN
  Administered 2017-01-23: 120 ug via INTRAVENOUS
  Administered 2017-01-23: 160 ug via INTRAVENOUS
  Administered 2017-01-23: 120 ug via INTRAVENOUS
  Administered 2017-01-23: 80 ug via INTRAVENOUS
  Administered 2017-01-23: 120 ug via INTRAVENOUS

## 2017-01-23 MED ORDER — ONDANSETRON HCL 4 MG/2ML IJ SOLN
INTRAMUSCULAR | Status: DC | PRN
  Administered 2017-01-23: 4 mg via INTRAVENOUS

## 2017-01-23 MED ORDER — ROCURONIUM BROMIDE 10 MG/ML (PF) SYRINGE
PREFILLED_SYRINGE | INTRAVENOUS | Status: AC
  Filled 2017-01-23: qty 5

## 2017-01-23 MED ORDER — EPHEDRINE 5 MG/ML INJ
INTRAVENOUS | Status: AC
  Filled 2017-01-23: qty 10

## 2017-01-23 MED ORDER — HYDROMORPHONE HCL 1 MG/ML IJ SOLN: 0.2500 mg | INTRAMUSCULAR | Status: DC | PRN

## 2017-01-23 MED ORDER — LIDOCAINE 2% (20 MG/ML) 5 ML SYRINGE
INTRAMUSCULAR | Status: AC
  Filled 2017-01-23: qty 5

## 2017-01-23 MED ORDER — DEXAMETHASONE SODIUM PHOSPHATE 10 MG/ML IJ SOLN
INTRAMUSCULAR | Status: AC
  Filled 2017-01-23: qty 1

## 2017-01-23 MED ORDER — FENTANYL CITRATE (PF) 100 MCG/2ML IJ SOLN
100.0000 ug | Freq: Once | INTRAMUSCULAR | Status: AC
  Administered 2017-01-23: 100 ug via INTRAVENOUS
  Filled 2017-01-23: qty 2

## 2017-01-23 MED ORDER — FENTANYL CITRATE (PF) 100 MCG/2ML IJ SOLN
INTRAMUSCULAR | Status: AC
  Administered 2017-01-23: 100 ug via INTRAVENOUS
  Filled 2017-01-23: qty 2

## 2017-01-23 MED ORDER — ROCURONIUM BROMIDE 100 MG/10ML IV SOLN
INTRAVENOUS | Status: DC | PRN
  Administered 2017-01-23: 50 mg via INTRAVENOUS

## 2017-01-23 MED ORDER — PROPOFOL 10 MG/ML IV BOLUS
INTRAVENOUS | Status: DC | PRN
  Administered 2017-01-23: 200 mg via INTRAVENOUS

## 2017-01-23 MED ORDER — MIDAZOLAM HCL 2 MG/2ML IJ SOLN
INTRAMUSCULAR | Status: AC
  Administered 2017-01-23: 2 mg via INTRAVENOUS
  Filled 2017-01-23: qty 2

## 2017-01-23 MED ORDER — LIDOCAINE 2% (20 MG/ML) 5 ML SYRINGE
INTRAMUSCULAR | Status: DC | PRN
Start: 2017-01-23 — End: 2017-01-23
  Administered 2017-01-23: 40 mg via INTRAVENOUS

## 2017-01-23 MED ORDER — DEXTROSE 5 % IV SOLN
INTRAVENOUS | Status: DC | PRN
  Administered 2017-01-23: 100 ug/min via INTRAVENOUS

## 2017-01-23 MED ORDER — PROPOFOL 10 MG/ML IV BOLUS
INTRAVENOUS | Status: AC
  Filled 2017-01-23: qty 20

## 2017-01-23 MED ORDER — DEXAMETHASONE SODIUM PHOSPHATE 10 MG/ML IJ SOLN
INTRAMUSCULAR | Status: DC | PRN
  Administered 2017-01-23: 5 mg via INTRAVENOUS

## 2017-01-23 MED ORDER — CEFAZOLIN SODIUM-DEXTROSE 2-4 GM/100ML-% IV SOLN
2.0000 g | INTRAVENOUS | Status: AC
  Administered 2017-01-23: 2 g via INTRAVENOUS
  Filled 2017-01-23: qty 100

## 2017-01-23 MED ORDER — ROPIVACAINE HCL 5 MG/ML IJ SOLN
INTRAMUSCULAR | Status: DC | PRN
  Administered 2017-01-23: 20 mL via EPIDURAL

## 2017-01-23 MED ORDER — BUPIVACAINE-EPINEPHRINE (PF) 0.5% -1:200000 IJ SOLN
INTRAMUSCULAR | Status: DC | PRN
  Administered 2017-01-23: 25 mL via PERINEURAL

## 2017-01-23 MED ORDER — FENTANYL CITRATE (PF) 250 MCG/5ML IJ SOLN
INTRAMUSCULAR | Status: AC
  Filled 2017-01-23: qty 5

## 2017-01-23 SURGICAL SUPPLY — 54 items
BANDAGE ACE 4X5 VEL STRL LF (GAUZE/BANDAGES/DRESSINGS) IMPLANT
BANDAGE ACE 6X5 VEL STRL LF (GAUZE/BANDAGES/DRESSINGS) IMPLANT
BANDAGE ESMARK 6X9 LF (GAUZE/BANDAGES/DRESSINGS) IMPLANT
BIT DRILL LCP QC 2X140 (BIT) ×6 IMPLANT
BNDG COHESIVE 6X5 TAN STRL LF (GAUZE/BANDAGES/DRESSINGS) ×6 IMPLANT
BNDG ESMARK 6X9 LF (GAUZE/BANDAGES/DRESSINGS)
BNDG GAUZE STRTCH 6 (GAUZE/BANDAGES/DRESSINGS) ×9 IMPLANT
BONE CANC CHIPS 20CC PCAN1/4 (Bone Implant) ×3 IMPLANT
CHIPS CANC BONE 20CC PCAN1/4 (Bone Implant) ×1 IMPLANT
COTTON STERILE ROLL (GAUZE/BANDAGES/DRESSINGS) ×3 IMPLANT
COVER SURGICAL LIGHT HANDLE (MISCELLANEOUS) ×6 IMPLANT
CUFF TOURNIQUET SINGLE 34IN LL (TOURNIQUET CUFF) IMPLANT
CUFF TOURNIQUET SINGLE 44IN (TOURNIQUET CUFF) IMPLANT
DRAPE HALF SHEET 40X57 (DRAPES) ×3 IMPLANT
DRAPE INCISE IOBAN 66X45 STRL (DRAPES) ×3 IMPLANT
DRAPE OEC MINIVIEW 54X84 (DRAPES) ×3 IMPLANT
DRAPE U-SHAPE 47X51 STRL (DRAPES) ×3 IMPLANT
DRSG ADAPTIC 3X8 NADH LF (GAUZE/BANDAGES/DRESSINGS) ×3 IMPLANT
DURAPREP 26ML APPLICATOR (WOUND CARE) ×3 IMPLANT
ELECT REM PT RETURN 9FT ADLT (ELECTROSURGICAL) ×3
ELECTRODE REM PT RTRN 9FT ADLT (ELECTROSURGICAL) ×1 IMPLANT
GAUZE SPONGE 4X4 12PLY STRL (GAUZE/BANDAGES/DRESSINGS) ×3 IMPLANT
GLOVE BIOGEL PI IND STRL 9 (GLOVE) ×1 IMPLANT
GLOVE BIOGEL PI INDICATOR 9 (GLOVE) ×2
GLOVE SURG ORTHO 9.0 STRL STRW (GLOVE) ×3 IMPLANT
GOWN STRL REUS W/ TWL XL LVL3 (GOWN DISPOSABLE) ×3 IMPLANT
GOWN STRL REUS W/TWL XL LVL3 (GOWN DISPOSABLE) ×6
KIT BASIN OR (CUSTOM PROCEDURE TRAY) ×3 IMPLANT
KIT DRSG PREVENA PLUS 7DAY 125 (MISCELLANEOUS) ×3 IMPLANT
KIT ROOM TURNOVER OR (KITS) ×3 IMPLANT
MANIFOLD NEPTUNE II (INSTRUMENTS) ×3 IMPLANT
NS IRRIG 1000ML POUR BTL (IV SOLUTION) ×3 IMPLANT
PACK ORTHO EXTREMITY (CUSTOM PROCEDURE TRAY) ×3 IMPLANT
PAD ARMBOARD 7.5X6 YLW CONV (MISCELLANEOUS) ×6 IMPLANT
PADDING CAST COTTON 6X4 STRL (CAST SUPPLIES) ×3 IMPLANT
PLATE CALCANEAL LOCK RT VA 2.7 (Plate) ×3 IMPLANT
SCREW LOCK VA ST 2.7X26 (Screw) ×6 IMPLANT
SCREW LOCK VA ST 2.7X32 (Screw) ×3 IMPLANT
SCREW LOCKING 2.7X28 (Screw) ×12 IMPLANT
SCREW LOCKING VA 2.7X42 (Screw) ×3 IMPLANT
SCREW METAPHYSEAL 2.7X28 (Screw) ×3 IMPLANT
SCREW METAPHYSEAL 2.7X30 (Screw) ×6 IMPLANT
SCREW METAPHYSEAL 2.7X32MM (Screw) ×3 IMPLANT
SPONGE LAP 18X18 X RAY DECT (DISPOSABLE) ×3 IMPLANT
STAPLER VISISTAT 35W (STAPLE) IMPLANT
SUCTION FRAZIER HANDLE 10FR (MISCELLANEOUS) ×2
SUCTION TUBE FRAZIER 10FR DISP (MISCELLANEOUS) ×1 IMPLANT
SUT ETHILON 2 0 PSLX (SUTURE) IMPLANT
SUT VIC AB 2-0 CTB1 (SUTURE) ×6 IMPLANT
TOWEL OR 17X24 6PK STRL BLUE (TOWEL DISPOSABLE) ×3 IMPLANT
TOWEL OR 17X26 10 PK STRL BLUE (TOWEL DISPOSABLE) ×3 IMPLANT
TUBE CONNECTING 12'X1/4 (SUCTIONS) ×1
TUBE CONNECTING 12X1/4 (SUCTIONS) ×2 IMPLANT
WATER STERILE IRR 1000ML POUR (IV SOLUTION) ×3 IMPLANT

## 2017-01-23 NOTE — Op Note (Signed)
01/23/2017  3:38 PM  PATIENT:  Charles Blankenship    PRE-OPERATIVE DIAGNOSIS:  RIGHT CALCANEUS FRACTURE  POST-OPERATIVE DIAGNOSIS:  Same  PROCEDURE:  OPEN REDUCTION INTERNAL FIXATION (ORIF) RIGHT CALCANEOUS FRACTURE Application of Prevena wound VAC  SURGEON:  Nadara MustardMarcus V Curtistine Pettitt, MD  PHYSICIAN ASSISTANT:None ANESTHESIA:   General  PREOPERATIVE INDICATIONS:  Charles Blankenship is a  56 y.o. male with a diagnosis of RIGHT CALCANEUS FRACTURE who failed conservative measures and elected for surgical management.    The risks benefits and alternatives were discussed with the patient preoperatively including but not limited to the risks of infection, bleeding, nerve injury, cardiopulmonary complications, the need for revision surgery, among others, and the patient was willing to proceed.  OPERATIVE IMPLANTS: Synthes large calcaneal plate  OPERATIVE FINDINGS: Displaced posterior facet fracture sanders 2 part  OPERATIVE PROCEDURE: Patient was brought the operating room and underwent general anesthetic. After adequate levels anesthesia obtained patient was placed the left lateral decubitus position with the right side up right lower extremity was prepped using DuraPrep draped into a sterile field Ioban was used to cover all exposed skin. A timeout was called. An extensile lateral incision was made over the calcaneus. The flap with the neurovascular bundles including the peroneal tendons was elevated. 0.0625 K wires were used to elevate the flap versus no forceps used on the flap. Patient's lateral wall was completely blown out and not attached to soft tissue this was removed. A steinman pin was placed in the calcaneus and using a Cobb elevator the tongue type posterior facet fracture was elevated to line up with the medial posterior facet this was pinned with a K wire. The bone void was filled with 20 mL of cancellus chips. The tongue-type fracture was reduced with a tenaculum clamp a plate was applied  laterally and compression screws were placed in the superior posterior fragment and the inferior fragment was secured with locking screws. Distally locking screws were placed 4 there was a total of 11 screws used. C-arm fluoroscopy verified reduction. The wound was irrigated with normal saline. Subcutaneous is closed using 2-0 nylon the skin was closed using a modified Algower Donati suture technique. There was no tension on the skin. A Prevena wound VAC was applied this had a good suction fit patient was extubated taken the PACU in stable condition.

## 2017-01-23 NOTE — Discharge Instructions (Signed)
General Anesthesia, Adult, Care After °These instructions provide you with information about caring for yourself after your procedure. Your health care provider may also give you more specific instructions. Your treatment has been planned according to current medical practices, but problems sometimes occur. Call your health care provider if you have any problems or questions after your procedure. °What can I expect after the procedure? °After the procedure, it is common to have: °· Vomiting. °· A sore throat. °· Mental slowness. ° °It is common to feel: °· Nauseous. °· Cold or shivery. °· Sleepy. °· Tired. °· Sore or achy, even in parts of your body where you did not have surgery. ° °Follow these instructions at home: °For at least 24 hours after the procedure: °· Do not: °? Participate in activities where you could fall or become injured. °? Drive. °? Use heavy machinery. °? Drink alcohol. °? Take sleeping pills or medicines that cause drowsiness. °? Make important decisions or sign legal documents. °? Take care of children on your own. °· Rest. °Eating and drinking °· If you vomit, drink water, juice, or soup when you can drink without vomiting. °· Drink enough fluid to keep your urine clear or pale yellow. °· Make sure you have little or no nausea before eating solid foods. °· Follow the diet recommended by your health care provider. °General instructions °· Have a responsible adult stay with you until you are awake and alert. °· Return to your normal activities as told by your health care provider. Ask your health care provider what activities are safe for you. °· Take over-the-counter and prescription medicines only as told by your health care provider. °· If you smoke, do not smoke without supervision. °· Keep all follow-up visits as told by your health care provider. This is important. °Contact a health care provider if: °· You continue to have nausea or vomiting at home, and medicines are not helpful. °· You  cannot drink fluids or start eating again. °· You cannot urinate after 8-12 hours. °· You develop a skin rash. °· You have fever. °· You have increasing redness at the site of your procedure. °Get help right away if: °· You have difficulty breathing. °· You have chest pain. °· You have unexpected bleeding. °· You feel that you are having a life-threatening or urgent problem. °This information is not intended to replace advice given to you by your health care provider. Make sure you discuss any questions you have with your health care provider. °Document Released: 10/27/2000 Document Revised: 12/24/2015 Document Reviewed: 07/05/2015 °Elsevier Interactive Patient Education © 2018 [employer] ° °

## 2017-01-23 NOTE — Anesthesia Procedure Notes (Signed)
Anesthesia Regional Block: Popliteal block   Pre-Anesthetic Checklist: ,, timeout performed, Correct Patient, Correct Site, Correct Laterality, Correct Procedure, Correct Position, site marked, Risks and benefits discussed,  Surgical consent,  Pre-op evaluation,  At surgeon's request and post-op pain management  Laterality: Right  Prep: chloraprep       Needles:  Injection technique: Single-shot  Needle Type: Echogenic Needle     Needle Length: 9cm  Needle Gauge: 21     Additional Needles:   Procedures: ultrasound guided,,,,,,,,  Narrative:  Start time: 01/23/2017 1:34 PM End time: 01/23/2017 1:41 PM Injection made incrementally with aspirations every 5 mL.  Performed by: Personally  Anesthesiologist: Marcene DuosFITZGERALD, Sigmund Morera

## 2017-01-23 NOTE — Anesthesia Procedure Notes (Signed)
Procedure Name: Intubation Date/Time: 01/23/2017 2:20 PM Performed by: Freddie Breech Pre-anesthesia Checklist: Patient identified, Emergency Drugs available, Suction available and Patient being monitored Patient Re-evaluated:Patient Re-evaluated prior to inductionOxygen Delivery Method: Circle System Utilized Preoxygenation: Pre-oxygenation with 100% oxygen Intubation Type: IV induction Ventilation: Mask ventilation without difficulty Laryngoscope Size: Mac and 3 Grade View: Grade I Tube type: Oral Tube size: 7.5 mm Number of attempts: 1 Airway Equipment and Method: Stylet Placement Confirmation: ETT inserted through vocal cords under direct vision,  positive ETCO2 and breath sounds checked- equal and bilateral Secured at: 23 cm Tube secured with: Tape Dental Injury: Teeth and Oropharynx as per pre-operative assessment

## 2017-01-23 NOTE — Transfer of Care (Signed)
Immediate Anesthesia Transfer of Care Note  Patient: Charles GentaRicky Bostwick  Procedure(s) Performed: Procedure(s): OPEN REDUCTION INTERNAL FIXATION (ORIF) RIGHT CALCANEOUS FRACTURE (Right)  Patient Location: PACU  Anesthesia Type:GA combined with regional for post-op pain  Level of Consciousness: awake, alert , oriented and patient cooperative  Airway & Oxygen Therapy: Patient Spontanous Breathing and Patient connected to face mask oxygen  Post-op Assessment: Report given to RN, Post -op Vital signs reviewed and stable and Patient moving all extremities  Post vital signs: Reviewed and stable  Last Vitals:  Vitals:   01/23/17 1355 01/23/17 1541  BP: 124/74 (P) 113/69  Pulse: 79 (P) 76  Resp: 12 (P) 12  Temp:  (P) 36.6 C    Last Pain:  Vitals:   01/23/17 1355  TempSrc:   PainSc: 0-No pain      Patients Stated Pain Goal: 2 (01/23/17 1151)  Complications: No apparent anesthesia complications

## 2017-01-23 NOTE — H&P (Signed)
Charles Blankenship is an 56 y.o. male.   Chief Complaint: Right heel pain HPI: Patient is a 56 year old gentleman who states that he fell from approximately 8 feet elevation off a ladder. Patient landed on his right heel. Patient has displaced subtalar joint with fracture through the posterior facet and presents at this time for open reduction internal fixation.  Past Medical History:  Diagnosis Date  . Arthritis   . Gout   . Hyperlipemia   . Hypertension     Past Surgical History:  Procedure Laterality Date  . EYE SURGERY     pt had right eye removed  . JOINT REPLACEMENT Right 2016   Hip Replacement    History reviewed. No pertinent family history. Social History:  reports that he has been smoking Cigarettes.  He has a 6.25 pack-year smoking history. He has never used smokeless tobacco. He reports that he does not drink alcohol or use drugs.  Allergies:  Allergies  Allergen Reactions  . No Known Allergies     No prescriptions prior to admission.    No results found for this or any previous visit (from the past 48 hour(s)). No results found.  Review of Systems  All other systems reviewed and are negative.  Patient is a smoker.  There were no vitals taken for this visit. Physical Exam   Patient is alert oriented no adenopathy well-dressed normal affect normal respiratory effort he's ambulating nonweightbearing on the right. Patient does have swelling but there is no blistering there is ecchymosis and bruising laterally. Patient has a good dorsalis pedis pulse. Radiographs shows a patellar fracture with complete displacement of the posterior facet of the subtalar joint Assessment/Plan Assessment: Displaced calcaneal fracture with non-congruent posterior facet with a patient with history of tobacco use.  Plan: Discussed with the patient I would not consider internal fixation if patient would not stop smoking. Discussed that with smoking patient is an increased risk of wound  dehiscence and potential for amputation of the foot. Patient states he understands and wishes to proceed with surgery at this time. Additional risks include infection neurovascular injury nonhealing wound arthritis for additional surgery. Patient states he understands wish to proceed at this time. Plan for extensile approach open reduction internal fixation of the right calcaneus.  Nadara MustardMarcus V Duda, MD 01/23/2017, 6:32 AM

## 2017-01-23 NOTE — Anesthesia Postprocedure Evaluation (Signed)
Anesthesia Post Note  Patient: Charles GentaRicky Matranga  Procedure(s) Performed: Procedure(s) (LRB): OPEN REDUCTION INTERNAL FIXATION (ORIF) RIGHT CALCANEOUS FRACTURE (Right)     Patient location during evaluation: PACU Anesthesia Type: General Level of consciousness: awake and alert Pain management: pain level controlled Vital Signs Assessment: post-procedure vital signs reviewed and stable Respiratory status: spontaneous breathing, nonlabored ventilation, respiratory function stable and patient connected to nasal cannula oxygen Cardiovascular status: blood pressure returned to baseline and stable Postop Assessment: no signs of nausea or vomiting Anesthetic complications: no    Last Vitals:  Vitals:   01/23/17 1630 01/23/17 1645  BP: 105/69   Pulse: 75 78  Resp: 10 19  Temp:      Last Pain:  Vitals:   01/23/17 1645  TempSrc:   PainSc: 0-No pain                 Kennieth RadFitzgerald, Donne Baley E

## 2017-01-23 NOTE — Anesthesia Preprocedure Evaluation (Signed)
Anesthesia Evaluation  Patient identified by MRN, date of birth, ID band Patient awake    Reviewed: Allergy & Precautions, NPO status , Patient's Chart, lab work & pertinent test results  Airway Mallampati: II  TM Distance: >3 FB Neck ROM: Full    Dental  (+) Dental Advisory Given   Pulmonary Current Smoker,    breath sounds clear to auscultation       Cardiovascular hypertension, Pt. on medications  Rhythm:Regular Rate:Normal     Neuro/Psych negative neurological ROS     GI/Hepatic negative GI ROS, Neg liver ROS,   Endo/Other  negative endocrine ROS  Renal/GU negative Renal ROS     Musculoskeletal  (+) Arthritis ,   Abdominal   Peds  Hematology negative hematology ROS (+)   Anesthesia Other Findings   Reproductive/Obstetrics                             Anesthesia Physical Anesthesia Plan  ASA: II  Anesthesia Plan: General   Post-op Pain Management:  Regional for Post-op pain   Induction: Intravenous  PONV Risk Score and Plan: 1 and Ondansetron and Dexamethasone  Airway Management Planned: LMA and Oral ETT  Additional Equipment:   Intra-op Plan:   Post-operative Plan: Extubation in OR  Informed Consent: I have reviewed the patients History and Physical, chart, labs and discussed the procedure including the risks, benefits and alternatives for the proposed anesthesia with the patient or authorized representative who has indicated his/her understanding and acceptance.   Dental advisory given  Plan Discussed with: CRNA  Anesthesia Plan Comments:         Anesthesia Quick Evaluation

## 2017-01-23 NOTE — Anesthesia Procedure Notes (Signed)
Anesthesia Regional Block: Adductor canal block   Pre-Anesthetic Checklist: ,, timeout performed, Correct Patient, Correct Site, Correct Laterality, Correct Procedure, Correct Position, site marked, Risks and benefits discussed,  Surgical consent,  Pre-op evaluation,  At surgeon's request and post-op pain management  Laterality: Right  Prep: chloraprep       Needles:  Injection technique: Single-shot  Needle Type: Echogenic Needle     Needle Length: 9cm  Needle Gauge: 21     Additional Needles:   Procedures: ultrasound guided,,,,,,,,  Narrative:  Start time: 01/23/2017 1:42 PM End time: 01/23/2017 1:47 PM Injection made incrementally with aspirations every 5 mL.  Performed by: Personally  Anesthesiologist: Marcene DuosFITZGERALD, Michaelah Credeur

## 2017-01-25 ENCOUNTER — Encounter (HOSPITAL_COMMUNITY): Payer: Self-pay | Admitting: Emergency Medicine

## 2017-01-25 ENCOUNTER — Emergency Department (HOSPITAL_COMMUNITY)
Admission: EM | Admit: 2017-01-25 | Discharge: 2017-01-25 | Disposition: A | Discharge status: Home / Self Care | Payer: Medicaid Other | Attending: Emergency Medicine | Admitting: Emergency Medicine

## 2017-01-25 DIAGNOSIS — I10 Essential (primary) hypertension: Secondary | ICD-10-CM | POA: Insufficient documentation

## 2017-01-25 DIAGNOSIS — Z7982 Long term (current) use of aspirin: Secondary | ICD-10-CM | POA: Insufficient documentation

## 2017-01-25 DIAGNOSIS — Z79899 Other long term (current) drug therapy: Secondary | ICD-10-CM | POA: Diagnosis not present

## 2017-01-25 DIAGNOSIS — S92011D Displaced fracture of body of right calcaneus, subsequent encounter for fracture with routine healing: Secondary | ICD-10-CM | POA: Insufficient documentation

## 2017-01-25 DIAGNOSIS — F17210 Nicotine dependence, cigarettes, uncomplicated: Secondary | ICD-10-CM | POA: Insufficient documentation

## 2017-01-25 DIAGNOSIS — X58XXXD Exposure to other specified factors, subsequent encounter: Secondary | ICD-10-CM | POA: Diagnosis not present

## 2017-01-25 DIAGNOSIS — M79671 Pain in right foot: Secondary | ICD-10-CM

## 2017-01-25 DIAGNOSIS — Z4803 Encounter for change or removal of drains: Secondary | ICD-10-CM | POA: Diagnosis not present

## 2017-01-25 NOTE — ED Notes (Signed)
Pts wound vac is reading that there is a blockage.

## 2017-01-25 NOTE — Discharge Instructions (Signed)
I discussed your care with the orthopedic surgeon on-call. He recommended removing the drain. New dressing has been applied. Elevate foot. Continue to use crutches. Follow-up with your surgeon on Thursday.

## 2017-01-25 NOTE — ED Provider Notes (Signed)
[facility]EM[facility]ENCY DEPT Provider Note   CSN: [id number] Arrival date & time: 01/25/17  0732     History   Chief Complaint Chief Complaint  Patient presents with  . Post-op Problem    HPI Charles Blankenship is a 56 y.o. male.  Status post right calcaneal fracture on 01/23/17 with open reduction and internal fixation of same by Dr. Aldean Baker.  A Prevena PLUS 125 drain was placed on the lateral aspect of the calcaneus. Patient reports his drain is no longer working and the mechanical device is beeping. No fever, sweats, chills, redness streaking up the leg. No pain.      Past Medical History:  Diagnosis Date  . Arthritis   . Gout   . Hyperlipemia   . Hypertension     Patient Active Problem List   Diagnosis Date Noted  . Displaced fracture of body of right calcaneus, initial encounter for closed fracture     Past Surgical History:  Procedure Laterality Date  . DG FOOT HEEL ([facility] HX) Right   . EYE SURGERY     pt had right eye removed  . JOINT REPLACEMENT Right 2016   Hip Replacement       Home Medications    Prior to Admission medications   Medication Sig Start Date End Date Taking? Authorizing Provider  aspirin EC 81 MG tablet Take 81 mg by mouth daily.    [provider]  cyclobenzaprine (FLEXERIL) 10 MG tablet Take 10 mg by mouth 3 (three) times daily as needed for muscle spasms.    [provider]  diphenhydrAMINE (BENADRYL) 25 MG tablet Take 25 mg by mouth every 6 (six) hours as needed for itching.    [provider]  doxepin (SINEQUAN) 25 MG capsule Take 25 mg by mouth at bedtime.    [provider]  ibuprofen (ADVIL,MOTRIN) 800 MG tablet Take 800 mg by mouth every 8 (eight) hours as needed for headache or moderate pain.    [provider]  lisinopril-hydrochlorothiazide (PRINZIDE,ZESTORETIC) 20-12.5 MG per tablet Take 1 tablet by mouth daily.    [provider]  Multiple Vitamin (MULTIVITAMIN WITH MINERALS)  TABS tablet Take 1 tablet by mouth daily.    [provider]  oxyCODONE-acetaminophen (PERCOCET/ROXICET) 5-325 MG tablet Take 1 tablet by mouth every 4 (four) hours as needed for severe pain. 01/22/17   [name], MD  pravastatin (PRAVACHOL) 20 MG tablet Take 20 mg by mouth at bedtime.    [provider]  vitamin B-12 (CYANOCOBALAMIN) 500 MCG tablet Take 500 mcg by mouth daily.    [provider]    Family History History reviewed. No pertinent family history.  Social History Social History  Substance Use Topics  . Smoking status: Current Every Day Smoker    Packs/day: 0.25    Years: 25.00    Types: Cigarettes  . Smokeless tobacco: Never Used  . Alcohol use No     Allergies   Patient has no known allergies.   Review of Systems Review of Systems  All other systems reviewed and are negative.    Physical Exam Updated Vital Signs BP (!) 114/91 (BP Location: Left Arm)   Pulse (S) (!) 114   Temp 98.3 F (36.8 C) (Oral)   Resp 18   Ht 6' (1.829 m)   Wt 105.2 kg (232 lb)   SpO2 96%   BMI 31.46 kg/m   Physical Exam  Constitutional: He is oriented to person, place, and time. He  appears well-developed and well-nourished.  HENT:  Head: Normocephalic and atraumatic.  Eyes: Conjunctivae are normal.  Neck: Neck supple.  Musculoskeletal:  Right lower extremity: Drain in place on lateral aspect of right calcaneus. No edema, erythema, pus  Neurological: He is alert and oriented to person, place, and time.  Skin: Skin is warm and dry.  Psychiatric: He has a normal mood and affect. His behavior is normal.  Nursing note and vitals reviewed.    ED Treatments / Results  Labs (all labs ordered are listed, but only abnormal results are displayed) Labs Reviewed - No data to display  EKG  EKG Interpretation None       Radiology No results found.  Procedures Procedures (including critical care time)  Medications Ordered in  ED Medications - No data to display   Initial Impression / Assessment and Plan / ED Course  I have reviewed the triage vital signs and the nursing notes.  Pertinent labs & imaging results that were available during my care of the patient were reviewed by me and considered in my medical decision making (see chart for details).     I discussed the clinical situation with the orthopedic surgeon on call.  He recommended removal of the drain. This was performed by myself. The wound was examined and appeared to be healing appropriately. A standard pressure dressing was applied.Patient will follow-up with orthopedics this week.  Final Clinical Impressions(s) / ED Diagnoses   Final diagnoses:  Pain of right heel    New Prescriptions New Prescriptions   No medications on file     Donnetta Hutchingook, Edda Orea, MD 01/26/17 1256

## 2017-01-25 NOTE — ED Triage Notes (Signed)
Patient had surgery on Friday for fractured heel. Per patient metal plates placed with electronic wound vac. Patient's machine started alarming last night intermittently, showing blockage in tube. Patient's machine is a Prevena plus 125.

## 2017-01-26 ENCOUNTER — Encounter (HOSPITAL_COMMUNITY): Payer: Self-pay | Admitting: Orthopedic Surgery

## 2017-01-27 ENCOUNTER — Telehealth (INDEPENDENT_AMBULATORY_CARE_PROVIDER_SITE_OTHER): Payer: Self-pay | Admitting: Orthopedic Surgery

## 2017-01-27 NOTE — Telephone Encounter (Signed)
Patients wife called on behalf of her husband. She said they had the wound vac removed this weekend because it was beeping, and she's just been wrapping it up in a bandage without any antiseptic spray. Do they continue doing this until his appointment on Thursday? CB # [phone] Can leave a message.

## 2017-01-27 NOTE — Telephone Encounter (Signed)
I called and advised that they can use a 4x4 and ace bandage daily and apply this every day until appt thursday

## 2017-01-29 ENCOUNTER — Ambulatory Visit (INDEPENDENT_AMBULATORY_CARE_PROVIDER_SITE_OTHER): Payer: Medicaid Other | Admitting: Orthopedic Surgery

## 2017-01-29 ENCOUNTER — Encounter (INDEPENDENT_AMBULATORY_CARE_PROVIDER_SITE_OTHER): Payer: Self-pay | Admitting: Orthopedic Surgery

## 2017-01-29 VITALS — Ht 72.0 in | Wt 232.0 lb

## 2017-01-29 DIAGNOSIS — S92011A Displaced fracture of body of right calcaneus, initial encounter for closed fracture: Secondary | ICD-10-CM

## 2017-01-29 MED ORDER — OXYCODONE-ACETAMINOPHEN 5-325 MG PO TABS: 1.0000 | ORAL_TABLET | ORAL | 0 refills | Status: DC | PRN

## 2017-01-29 NOTE — Progress Notes (Signed)
   Office Visit Note   Patient: Charles Blankenship           Date of Birth: 07/09/1961           MRN: [id number] Visit Date: 01/29/2017              Requested by: Tylene FantasiaMuse, Rochelle D., PA-C [address] [place], [place] [id number] PCP: Tylene FantasiaMuse, Rochelle D., PA-C  Chief Complaint  Patient presents with  . Right Foot - Routine Post Op    01/23/17 right ORIF calcaneous fx with prevena       HPI: Patient is one-week status post open reduction internal fixation right calcaneal fracture he did have a Prevena wound VAC in place the emergency room and have this removed. Patient is currently weightbearing with crutches and a fracture boot  Assessment & Plan: Visit Diagnoses:  1. Closed displaced fracture of body of right calcaneus, initial encounter     Plan: Discussed the importance of strict nonweightbearing as well as range of motion of the ankle and foot spell the alphabet with his foot. Discussed the importance of elevation and he is given a prescription to get a pair of medical compression stockings to be worn around the clock. Patient is to begin Dial soap cleansing of the incision.  Follow-Up Instructions: Return in about 1 week (around 02/05/2017).   Ortho Exam  Patient is alert, oriented, no adenopathy, well-dressed, normal affect, normal respiratory effort. Examination patient has swelling of the entire right lower extremity there is pitting edema. The wound edges have no dehiscence no cellulitis there is a small amount of serosanguineous drainage there is no cellulitis.  Imaging: No results found.  Labs: No results found for: HGBA1C, ESRSEDRATE, CRP, LABURIC, REPTSTATUS, GRAMSTAIN, CULT, LABORGA  Orders:  No orders of the defined types were placed in this encounter.  No orders of the defined types were placed in this encounter.    Procedures: No procedures performed  Clinical Data: No additional findings.  ROS:  All other systems negative, except as noted in the  HPI. Review of Systems  Objective: Vital Signs: Ht 6' (1.829 m)   Wt 232 lb (105.2 kg)   BMI 31.46 kg/m   Specialty Comments:  No specialty comments available.  PMFS History: Patient Active Problem List   Diagnosis Date Noted  . Displaced fracture of body of right calcaneus, initial encounter for closed fracture    Past Medical History:  Diagnosis Date  . Arthritis   . Gout   . Hyperlipemia   . Hypertension     No family history on file.  Past Surgical History:  Procedure Laterality Date  . DG FOOT HEEL ([facility] HX) Right   . EYE SURGERY     pt had right eye removed  . JOINT REPLACEMENT Right 2016   Hip Replacement  . ORIF CALCANEOUS FRACTURE Right 01/23/2017   Procedure: OPEN REDUCTION INTERNAL FIXATION (ORIF) RIGHT CALCANEOUS FRACTURE;  Surgeon: Nadara Mustarduda, Arbie Blankley V, MD;  [facility];  Service: Orthopedics;  Laterality: Right;   Social History   Occupational History  . Not on file.   Social History Main Topics  . Smoking status: Current Every Day Smoker    Packs/day: 0.25    Years: 25.00    Types: Cigarettes  . Smokeless tobacco: Never Used  . Alcohol use No  . Drug use: No  . Sexual activity: Yes

## 2017-02-05 ENCOUNTER — Ambulatory Visit (INDEPENDENT_AMBULATORY_CARE_PROVIDER_SITE_OTHER): Payer: Medicaid Other

## 2017-02-05 ENCOUNTER — Encounter (INDEPENDENT_AMBULATORY_CARE_PROVIDER_SITE_OTHER): Payer: Self-pay | Admitting: Orthopedic Surgery

## 2017-02-05 ENCOUNTER — Ambulatory Visit (INDEPENDENT_AMBULATORY_CARE_PROVIDER_SITE_OTHER): Payer: Medicaid Other | Admitting: Orthopedic Surgery

## 2017-02-05 VITALS — Ht 72.0 in | Wt 232.0 lb

## 2017-02-05 DIAGNOSIS — S92011D Displaced fracture of body of right calcaneus, subsequent encounter for fracture with routine healing: Secondary | ICD-10-CM | POA: Diagnosis not present

## 2017-02-05 MED ORDER — OXYCODONE-ACETAMINOPHEN 5-325 MG PO TABS: 1.0000 | ORAL_TABLET | ORAL | 0 refills | Status: DC | PRN

## 2017-02-05 NOTE — Progress Notes (Signed)
Office Visit Note   Patient: Charles GentaRicky Stenerson           Date of Birth: 06/13/1961           MRN: [id number] Visit Date: 02/05/2017              Requested by: Tylene FantasiaMuse, Rochelle D., PA-C [address] [place], [place] [address] PCP: Tylene FantasiaMuse, Rochelle D., PA-C  Chief Complaint  Patient presents with  . Right Foot - Follow-up    01/23/17 ORIF Right Calcaneus fracture 13 days post op       HPI: Patient is 2 weeks status post open reduction until fixation of the calcaneus. Patient has been advised strict nonweightbearing dialysis of cleansing he has been touchdown weightbearing on crutches today.  Assessment & Plan: Visit Diagnoses:  1. Closed displaced fracture of body of right calcaneus with routine healing, subsequent encounter     Plan: Recommend patient wear the stockings around-the-clock recommended scar massage recommended nonweightbearing range of motion of the ankle and subtalar joint prescription for Percocet follow-up in 2 weeks with repeat 2 view radiographs of the right calcaneus  Follow-Up Instructions: Return in about 2 weeks (around 02/19/2017).   Ortho Exam  Patient is alert, oriented, no adenopathy, well-dressed, normal affect, normal respiratory effort. Examination patient has a well approximated incision. There is one area granulation tissue that is 3 x 10 mm 0.1 mm deep with a little bit of granulation tissue there is no synovitis no drainage no odor he does have pitting edema of his leg and has not been wearing the compression stockings. Patient has no pain with range of motion of the ankle or subtalar joint. He always has pain on the heel pad where the impact occurred.  Imaging: Xr Os Calcis Right  Result Date: 02/05/2017 2 view radiographs of the right calcaneus show stable internal fixation. Patient has a little subsidence of the posterior facet however the posterior facet is congruent.   Labs: No results found for: HGBA1C, ESRSEDRATE, CRP, LABURIC,  REPTSTATUS, GRAMSTAIN, CULT, LABORGA  Orders:  Orders Placed This Encounter  Procedures  . XR Os Calcis Right   Meds ordered this encounter  Medications  . oxyCODONE-acetaminophen (PERCOCET/ROXICET) 5-325 MG tablet    Sig: Take 1 tablet by mouth every 4 (four) hours as needed for severe pain.    Dispense:  40 tablet    Refill:  0     Procedures: No procedures performed  Clinical Data: No additional findings.  ROS:  All other systems negative, except as noted in the HPI. Review of Systems  Objective: Vital Signs: Ht 6' (1.829 m)   Wt 232 lb (105.2 kg)   BMI 31.46 kg/m   Specialty Comments:  No specialty comments available.  PMFS History: Patient Active Problem List   Diagnosis Date Noted  . Displaced fracture of body of right calcaneus, initial encounter for closed fracture    Past Medical History:  Diagnosis Date  . Arthritis   . Gout   . Hyperlipemia   . Hypertension     History reviewed. No pertinent family history.  Past Surgical History:  Procedure Laterality Date  . DG FOOT HEEL ([facility] HX) Right   . EYE SURGERY     pt had right eye removed  . JOINT REPLACEMENT Right 2016   Hip Replacement  . ORIF CALCANEOUS FRACTURE Right 01/23/2017   Procedure: OPEN REDUCTION INTERNAL FIXATION (ORIF) RIGHT CALCANEOUS FRACTURE;  Surgeon: Nadara Mustarduda, Marcus V, MD;  [facility];  Service: Orthopedics;  Laterality: Right;   Social History   Occupational History  . Not on file.   Social History Main Topics  . Smoking status: Current Every Day Smoker    Packs/day: 0.25    Years: 25.00    Types: Cigarettes  . Smokeless tobacco: Never Used  . Alcohol use No  . Drug use: No  . Sexual activity: Yes

## 2017-02-18 ENCOUNTER — Other Ambulatory Visit (INDEPENDENT_AMBULATORY_CARE_PROVIDER_SITE_OTHER): Payer: Self-pay | Admitting: Family

## 2017-02-18 ENCOUNTER — Telehealth (INDEPENDENT_AMBULATORY_CARE_PROVIDER_SITE_OTHER): Payer: Self-pay | Admitting: Family

## 2017-02-18 MED ORDER — OXYCODONE-ACETAMINOPHEN 5-325 MG PO TABS: 1.0000 | ORAL_TABLET | Freq: Four times a day (QID) | ORAL | 0 refills | Status: DC | PRN

## 2017-02-18 NOTE — Telephone Encounter (Signed)
I called advised patient rx is ready for pick up at the front desk. His wife is coming to pick up, advised him to have her bring her photo id.

## 2017-02-18 NOTE — Telephone Encounter (Signed)
Patient called needing Rx refilled (percocet) Patient advised he is out of his pain medication. The number to contact patient is [phone]

## 2017-02-23 ENCOUNTER — Ambulatory Visit (INDEPENDENT_AMBULATORY_CARE_PROVIDER_SITE_OTHER): Payer: Medicaid Other

## 2017-02-23 ENCOUNTER — Ambulatory Visit (INDEPENDENT_AMBULATORY_CARE_PROVIDER_SITE_OTHER): Payer: Medicaid Other | Admitting: Orthopedic Surgery

## 2017-02-23 VITALS — Ht 72.0 in | Wt 232.0 lb

## 2017-02-23 DIAGNOSIS — M79671 Pain in right foot: Secondary | ICD-10-CM

## 2017-02-23 DIAGNOSIS — S92011D Displaced fracture of body of right calcaneus, subsequent encounter for fracture with routine healing: Secondary | ICD-10-CM

## 2017-02-23 MED ORDER — OXYCODONE-ACETAMINOPHEN 5-325 MG PO TABS: 1.0000 | ORAL_TABLET | Freq: Three times a day (TID) | ORAL | 0 refills | Status: DC | PRN

## 2017-02-23 NOTE — Progress Notes (Signed)
Office Visit Note   Patient: Charles Blankenship           Date of Birth: 1961-02-23           MRN: [id number] Visit Date: 02/23/2017              Requested by: Tylene Fantasia., PA-C [address] [place], [place] [address] PCP: Tylene Fantasia., PA-C  Chief Complaint  Patient presents with  . Right Foot - Routine Post Op    01/23/17 right ORIF calcaneus fracture.       HPI: Patient presents 4 weeks status post open reduction internal fixation calcaneus fracture on the right. Patient states that he stopped doing his exercises and has stopped doing scar massage. Patient states that he is nonweightbearing with crutches he is currently in a sandal he states he stopped wearing her fracture boot because it hurt too much.  Assessment & Plan: Visit Diagnoses:  1. Pain of right heel   2. Closed displaced fracture of body of right calcaneus with routine healing, subsequent encounter     Plan: Recommended continue range of motion exercises recommended continued scar massage, recommended continued elevation and nonweightbearing and using the compression sock.  Follow-Up Instructions: Return in about 3 weeks (around 03/16/2017).   Ortho Exam  Patient is alert, oriented, no adenopathy, well-dressed, normal affect, normal respiratory effort. On examination patient still has swelling in the foot the apex of the extensile incision has dehisced a little it's about 3 x 5 mm and 1 mm deep there is no cellulitis no drainage no odor no signs of infection patient has dry cracked skin.  Imaging: Xr Foot Complete Right  Result Date: 02/23/2017 2 view radiographs of the right calcaneus shows some settling of the posterior facet.   Labs: No results found for: HGBA1C, ESRSEDRATE, CRP, LABURIC, REPTSTATUS, GRAMSTAIN, CULT, LABORGA  Orders:  Orders Placed This Encounter  Procedures  . XR Foot Complete Right   Meds ordered this encounter  Medications  . oxyCODONE-acetaminophen  (PERCOCET/ROXICET) 5-325 MG tablet    Sig: Take 1 tablet by mouth every 8 (eight) hours as needed for severe pain.    Dispense:  30 tablet    Refill:  0     Procedures: No procedures performed  Clinical Data: No additional findings.  ROS:  All other systems negative, except as noted in the HPI. Review of Systems  Objective: Vital Signs: Ht 6' (1.829 m)   Wt 232 lb (105.2 kg)   BMI 31.46 kg/m   Specialty Comments:  No specialty comments available.  PMFS History: Patient Active Problem List   Diagnosis Date Noted  . Displaced fracture of body of right calcaneus, initial encounter for closed fracture    Past Medical History:  Diagnosis Date  . Arthritis   . Gout   . Hyperlipemia   . Hypertension     No family history on file.  Past Surgical History:  Procedure Laterality Date  . DG FOOT HEEL ([facility] HX) Right   . EYE SURGERY     pt had right eye removed  . JOINT REPLACEMENT Right 2016   Hip Replacement  . ORIF CALCANEOUS FRACTURE Right 01/23/2017   Procedure: OPEN REDUCTION INTERNAL FIXATION (ORIF) RIGHT CALCANEOUS FRACTURE;  Surgeon: [name], MD;  Location: [facility] OR;  Service: Orthopedics;  Laterality: Right;   Social History   Occupational History  . Not on file.   Social History Main Topics  . Smoking status: Current  Every Day Smoker    Packs/day: 0.25    Years: 25.00    Types: Cigarettes  . Smokeless tobacco: Never Used  . Alcohol use No  . Drug use: No  . Sexual activity: Yes

## 2017-03-02 ENCOUNTER — Telehealth (INDEPENDENT_AMBULATORY_CARE_PROVIDER_SITE_OTHER): Payer: Self-pay | Admitting: Orthopedic Surgery

## 2017-03-02 ENCOUNTER — Other Ambulatory Visit (INDEPENDENT_AMBULATORY_CARE_PROVIDER_SITE_OTHER): Payer: Self-pay | Admitting: Orthopedic Surgery

## 2017-03-02 MED ORDER — OXYCODONE-ACETAMINOPHEN 5-325 MG PO TABS: 1.0000 | ORAL_TABLET | Freq: Three times a day (TID) | ORAL | 0 refills | Status: DC | PRN

## 2017-03-02 NOTE — Telephone Encounter (Signed)
rx written

## 2017-03-02 NOTE — Telephone Encounter (Signed)
PT REQUESTED REFILL OF PAIN MEDS PLEASE. HE STATED [facility] WILL ONLY GIVE 7 DAYS SUPPLY.  [phone]

## 2017-03-02 NOTE — Telephone Encounter (Signed)
Called pt and he is aware rx is ready for pick up.

## 2017-03-09 ENCOUNTER — Ambulatory Visit (INDEPENDENT_AMBULATORY_CARE_PROVIDER_SITE_OTHER): Payer: Medicaid Other | Admitting: Orthopedic Surgery

## 2017-03-09 ENCOUNTER — Encounter (INDEPENDENT_AMBULATORY_CARE_PROVIDER_SITE_OTHER): Payer: Self-pay | Admitting: Orthopedic Surgery

## 2017-03-09 VITALS — Ht 72.0 in | Wt 232.0 lb

## 2017-03-09 DIAGNOSIS — S92011D Displaced fracture of body of right calcaneus, subsequent encounter for fracture with routine healing: Secondary | ICD-10-CM

## 2017-03-09 MED ORDER — OXYCODONE-ACETAMINOPHEN 5-325 MG PO TABS: 1.0000 | ORAL_TABLET | Freq: Three times a day (TID) | ORAL | 0 refills | Status: DC | PRN

## 2017-03-09 NOTE — Addendum Note (Signed)
Addended by: Aldean BakerUDA, Leovanni Bjorkman on: 03/09/2017 10:21 AM   Modules accepted: Orders

## 2017-03-09 NOTE — Progress Notes (Signed)
Office Visit Note   Patient: Charles Blankenship           Date of Birth: 03/13/1961           MRN: [id number] Visit Date: 03/09/2017              Requested by: Tylene FantasiaMuse, Rochelle D., PA-C [address] [place], [place] [address] PCP: Tylene FantasiaMuse, Rochelle D., PA-C  Chief Complaint  Patient presents with  . Right Foot - Routine Post Op    01/23/17 right ORIF calcaneus fx       HPI: Patient presents 6 weeks status post open reduction internal fixation calcaneus fracture. Patient's wife states that the swelling was decreased with wearing the compression sock and the wound was looking much better. Patient states that this was the opposite that the foot was swollen more and he could not tolerate the compression of the sock.  Assessment & Plan: Visit Diagnoses:  1. Closed displaced fracture of body of right calcaneus with routine healing, subsequent encounter     Plan: We will apply silver cell plus a 3 layer Dynaflex compression wrap. Discussed the importance of elevation the importance of nonweightbearing discussed that we need to resolve the swelling or he is at risk of loss of limb due to wound dehiscence. Discussed the importance of compression.  Follow-Up Instructions: Return in about 1 week (around 03/16/2017).   Ortho Exam  Patient is alert, oriented, no adenopathy, well-dressed, normal affect, normal respiratory effort. Examination patient is not wearing the medical compression sock. He has increased swelling with wound dehiscence that is approximately 3 mm x 15 mm and 2 mm deep. There is fibrinous tissue along the wound. There is no cellulitis no odor no drainage no signs of infection.  Imaging: No results found.  Labs: No results found for: HGBA1C, ESRSEDRATE, CRP, LABURIC, REPTSTATUS, GRAMSTAIN, CULT, LABORGA  Orders:  No orders of the defined types were placed in this encounter.  No orders of the defined types were placed in this encounter.    Procedures: No procedures  performed  Clinical Data: No additional findings.  ROS:  All other systems negative, except as noted in the HPI. Review of Systems  Objective: Vital Signs: Ht 6' (1.829 m)   Wt 232 lb (105.2 kg)   BMI 31.46 kg/m   Specialty Comments:  No specialty comments available.  PMFS History: Patient Active Problem List   Diagnosis Date Noted  . Displaced fracture of body of right calcaneus, initial encounter for closed fracture    Past Medical History:  Diagnosis Date  . Arthritis   . Gout   . Hyperlipemia   . Hypertension     No family history on file.  Past Surgical History:  Procedure Laterality Date  . DG FOOT HEEL ([facility] HX) Right   . EYE SURGERY     pt had right eye removed  . JOINT REPLACEMENT Right 2016   Hip Replacement  . ORIF CALCANEOUS FRACTURE Right 01/23/2017   Procedure: OPEN REDUCTION INTERNAL FIXATION (ORIF) RIGHT CALCANEOUS FRACTURE;  Surgeon: Nadara Mustarduda, Marcus V, MD;  [facility];  Service: Orthopedics;  Laterality: Right;   Social History   Occupational History  . Not on file.   Social History Main Topics  . Smoking status: Current Every Day Smoker    Packs/day: 0.25    Years: 25.00    Types: Cigarettes  . Smokeless tobacco: Never Used  . Alcohol use No  . Drug use: No  .  Sexual activity: Yes

## 2017-03-16 ENCOUNTER — Encounter (INDEPENDENT_AMBULATORY_CARE_PROVIDER_SITE_OTHER): Payer: Self-pay | Admitting: Family

## 2017-03-16 ENCOUNTER — Ambulatory Visit (INDEPENDENT_AMBULATORY_CARE_PROVIDER_SITE_OTHER): Payer: Medicaid Other | Admitting: Family

## 2017-03-16 VITALS — Ht 72.0 in | Wt 232.0 lb

## 2017-03-16 DIAGNOSIS — S92011A Displaced fracture of body of right calcaneus, initial encounter for closed fracture: Secondary | ICD-10-CM

## 2017-03-16 MED ORDER — DOXYCYCLINE HYCLATE 100 MG PO TABS: 100.0000 mg | ORAL_TABLET | Freq: Two times a day (BID) | ORAL | 0 refills | Status: DC

## 2017-03-16 MED ORDER — OXYCODONE-ACETAMINOPHEN 5-325 MG PO TABS: 1.0000 | ORAL_TABLET | Freq: Three times a day (TID) | ORAL | 0 refills | Status: DC | PRN

## 2017-03-17 ENCOUNTER — Telehealth (INDEPENDENT_AMBULATORY_CARE_PROVIDER_SITE_OTHER): Payer: Self-pay

## 2017-03-17 NOTE — Telephone Encounter (Signed)
Pt is on the sch to see Erin on Friday. I spoke with Dr. Lajoyce Cornersuda and he would like to see the pt in the office tomorrow. I called and lm on vm for this pt to advise. Can you please follow up today to make sure that he moves his appt to Dr. Audrie Liauda's sch? Thanks!

## 2017-03-17 NOTE — Progress Notes (Signed)
Office Visit Note   Patient: Charles Blankenship           Date of Birth: 11/12/1960           MRN: [id number] Visit Date: 03/16/2017              Requested by: Tylene FantasiaMuse, Rochelle D., PA-C [address] [place], [place] [address] PCP: Tylene FantasiaMuse, Rochelle D., PA-C  Chief Complaint  Patient presents with  . Right Ankle - Follow-up    01/23/17 right ORIF cal fx with compression wrap      HPI: Him the patient is a 56 year old gentleman who presents today nearly 6 weeks status post ORIF for calcaneal fracture on the right. Surgery was on June 22 of this year. For the last week has been in a Dynaflex dressing which has improved the swelling. He's been nonweightbearing complains of foul odor. The wife states they've been having to burn candles all week and have been unable to breathe due to the foul odor.  Has not been on any oral antibiotics. There are 2 open area where there is drainage she has drained through the Dynaflex dressing.   Patient endorses chills and malaise. No fever.  Assessment & Plan: Visit Diagnoses:  1. Displaced fracture of body of right calcaneus, initial encounter for closed fracture     Plan: will start him on some doxycycline. Discussed possible irrigation and debridement with Dr. Magnus IvanBlackman and Rexene EdisonGil Clark.  will follow this closely follow-up in office him in 3 days. Cleanse the area with dial soap cover with a dry dressing, change this daily.  Follow-Up Instructions: Return in about 3 days (around 03/19/2017).   Ortho Exam  Patient is alert, oriented, no adenopathy, well-dressed, normal affect, normal respiratory effort. on examination of the lateral incision this is over all well-healed. there continues to be massive swelling of the ankle there is nonviable tissue and maceration which was debrided manually. There are 2 open areas these are both about 3 mm in diameter there is yellow drainage. Strong foul odor.   Imaging: No results found.     Labs: No results  found for: HGBA1C, ESRSEDRATE, CRP, LABURIC, REPTSTATUS, GRAMSTAIN, CULT, LABORGA  Orders:  No orders of the defined types were placed in this encounter.  Meds ordered this encounter  Medications  . doxycycline (VIBRA-TABS) 100 MG tablet    Sig: Take 1 tablet (100 mg total) by mouth 2 (two) times daily.    Dispense:  60 tablet    Refill:  0  . oxyCODONE-acetaminophen (PERCOCET/ROXICET) 5-325 MG tablet    Sig: Take 1 tablet by mouth every 8 (eight) hours as needed for severe pain.    Dispense:  21 tablet    Refill:  0     Procedures: No procedures performed  Clinical Data: No additional findings.  ROS:  All other systems negative, except as noted in the HPI. Review of Systems  Constitutional: Positive for chills. Negative for fever.  Musculoskeletal: Positive for arthralgias, joint swelling and myalgias.  Skin: Positive for wound.    Objective: Vital Signs: Ht 6' (1.829 m)   Wt 232 lb (105.2 kg)   BMI 31.46 kg/m   Specialty Comments:  No specialty comments available.  PMFS History: Patient Active Problem List   Diagnosis Date Noted  . Displaced fracture of body of right calcaneus, initial encounter for closed fracture    Past Medical History:  Diagnosis Date  . Arthritis   . Gout   .  Hyperlipemia   . Hypertension     No family history on file.  Past Surgical History:  Procedure Laterality Date  . DG FOOT HEEL ([facility] HX) Right   . EYE SURGERY     pt had right eye removed  . JOINT REPLACEMENT Right 2016   Hip Replacement  . ORIF CALCANEOUS FRACTURE Right 01/23/2017   Procedure: OPEN REDUCTION INTERNAL FIXATION (ORIF) RIGHT CALCANEOUS FRACTURE;  Surgeon: [name], MD;  Location: [facility] OR;  Service: Orthopedics;  Laterality: Right;   Social History   Occupational History  . Not on file.   Social History Main Topics  . Smoking status: Current Every Day Smoker    Packs/day: 0.25    Years: 25.00    Types: Cigarettes  . Smokeless tobacco: Never Used    . Alcohol use No  . Drug use: No  . Sexual activity: Yes

## 2017-03-19 ENCOUNTER — Encounter (INDEPENDENT_AMBULATORY_CARE_PROVIDER_SITE_OTHER): Payer: Self-pay | Admitting: Orthopedic Surgery

## 2017-03-19 ENCOUNTER — Other Ambulatory Visit (INDEPENDENT_AMBULATORY_CARE_PROVIDER_SITE_OTHER): Payer: Self-pay | Admitting: Family

## 2017-03-19 ENCOUNTER — Ambulatory Visit (INDEPENDENT_AMBULATORY_CARE_PROVIDER_SITE_OTHER): Payer: Medicaid Other | Admitting: Orthopedic Surgery

## 2017-03-19 DIAGNOSIS — T847XXA Infection and inflammatory reaction due to other internal orthopedic prosthetic devices, implants and grafts, initial encounter: Secondary | ICD-10-CM | POA: Insufficient documentation

## 2017-03-19 DIAGNOSIS — M1A0710 Idiopathic chronic gout, right ankle and foot, without tophus (tophi): Secondary | ICD-10-CM

## 2017-03-19 DIAGNOSIS — T847XXS Infection and inflammatory reaction due to other internal orthopedic prosthetic devices, implants and grafts, sequela: Secondary | ICD-10-CM

## 2017-03-19 MED ORDER — CEFAZOLIN SODIUM-DEXTROSE 2-4 GM/100ML-% IV SOLN
2.0000 g | INTRAVENOUS | Status: AC
  Administered 2017-03-20: 2 g via INTRAVENOUS
  Filled 2017-03-19: qty 100

## 2017-03-19 NOTE — Progress Notes (Signed)
Office Visit Note   Patient: Charles Blankenship           Date of Birth: 09/14/1960           MRN: [id number] Visit Date: 03/19/2017              Requested by: Tylene FantasiaMuse, Rochelle D., PA-C [address] [place], [place] [address] PCP: Tylene FantasiaMuse, Rochelle D., PA-C  Chief Complaint  Patient presents with  . Right Foot - Routine Post Op      HPI: Patient is status post open reduction internal fixation for right calcaneus fracture. Patient was placed on doxycycline states the drainage is almost completely resolved. Patient also complains of chronic gout-like symptoms in the right great toe MTP joint. Patient states that his primary care physician was waiting for the proper time to draw a uric acid level.  Assessment & Plan: Visit Diagnoses:  1. Chronic idiopathic gout involving toe of right foot without tophus   2. 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 With the patient still having a small open wound with small amount of purulent drainage removed remove the hardware discussed the risk of deep bone infection if we leave the hardware in place discussed potential risk of amputation if we do not surgically treat the infected hardware. Patient states he understands wishes to proceed at this time.  Follow-Up Instructions: Return in about 1 week (around 03/26/2017).   Ortho Exam  Patient is alert, oriented, no adenopathy, well-dressed, normal affect, normal respiratory effort. Examination patient has a small open wound 5 mm in diameter with purulent drainage this is tender to palpation he still has dry cracked skin he has pain to palpation of the MTP joint of the great toe. There is no redness or cellulitis.  Imaging: No results found. No images are attached to the encounter.  Labs: No results found for: HGBA1C, ESRSEDRATE, CRP, LABURIC, REPTSTATUS, GRAMSTAIN, CULT, LABORGA  Orders:  Orders Placed This Encounter  Procedures  .  Uric acid   No orders of the defined types were placed in this encounter.    Procedures: No procedures performed  Clinical Data: No additional findings.  ROS:  All other systems negative, except as noted in the HPI. Review of Systems  Objective: Vital Signs: There were no vitals taken for this visit.  Specialty Comments:  No specialty comments available.  PMFS History: Patient Active Problem List   Diagnosis Date Noted  . Chronic idiopathic gout involving toe of right foot without tophus 03/19/2017  . Infected hardware in right leg (HCC) 03/19/2017  . Displaced fracture of body of right calcaneus, initial encounter for closed fracture    Past Medical History:  Diagnosis Date  . Arthritis   . Gout   . Hyperlipemia   . Hypertension     History reviewed. No pertinent family history.  Past Surgical History:  Procedure Laterality Date  . DG FOOT HEEL ([facility] HX) Right   . EYE SURGERY     pt had right eye removed  . JOINT REPLACEMENT Right 2016   Hip Replacement  . ORIF CALCANEOUS FRACTURE Right 01/23/2017   Procedure: OPEN REDUCTION INTERNAL FIXATION (ORIF) RIGHT CALCANEOUS FRACTURE;  Surgeon: Nadara Mustarduda, Starlee Corralejo V, MD;  [facility];  Service: Orthopedics;  Laterality: Right;   Social History   Occupational History  . Not on file.   Social History Main Topics  . Smoking status: Current Every  Day Smoker    Packs/day: 0.25    Years: 25.00    Types: Cigarettes  . Smokeless tobacco: Never Used  . Alcohol use No  . Drug use: No  . Sexual activity: Yes

## 2017-03-19 NOTE — Progress Notes (Signed)
Unable to reach pt by phone. Left pre-op instructions on pt's home #. Did call his wife's cell # and she answered but she wasn't able to talk at the time. I told her that I had left instructions on the home# and to be sure and listen to them. She states she will.

## 2017-03-20 ENCOUNTER — Encounter (HOSPITAL_COMMUNITY): Payer: Self-pay | Admitting: *Deleted

## 2017-03-20 ENCOUNTER — Ambulatory Visit (INDEPENDENT_AMBULATORY_CARE_PROVIDER_SITE_OTHER): Payer: Medicaid Other | Admitting: Family

## 2017-03-20 ENCOUNTER — Ambulatory Visit (HOSPITAL_COMMUNITY): Payer: Medicaid Other | Admitting: Certified Registered Nurse Anesthetist

## 2017-03-20 ENCOUNTER — Ambulatory Visit (HOSPITAL_COMMUNITY)
Admission: RE | Admit: 2017-03-20 | Discharge: 2017-03-20 | Disposition: A | Discharge status: Home / Self Care | Payer: Medicaid Other | Source: Ambulatory Visit | Attending: Orthopedic Surgery | Admitting: Orthopedic Surgery

## 2017-03-20 ENCOUNTER — Encounter (HOSPITAL_COMMUNITY)
Admission: RE | Disposition: A | Discharge status: Home / Self Care | Payer: Self-pay | Source: Ambulatory Visit | Attending: Orthopedic Surgery

## 2017-03-20 DIAGNOSIS — E785 Hyperlipidemia, unspecified: Secondary | ICD-10-CM | POA: Insufficient documentation

## 2017-03-20 DIAGNOSIS — F17210 Nicotine dependence, cigarettes, uncomplicated: Secondary | ICD-10-CM | POA: Insufficient documentation

## 2017-03-20 DIAGNOSIS — Z96641 Presence of right artificial hip joint: Secondary | ICD-10-CM | POA: Insufficient documentation

## 2017-03-20 DIAGNOSIS — E669 Obesity, unspecified: Secondary | ICD-10-CM | POA: Insufficient documentation

## 2017-03-20 DIAGNOSIS — Y838 Other surgical procedures as the cause of abnormal reaction of the patient, or of later complication, without mention of misadventure at the time of the procedure: Secondary | ICD-10-CM | POA: Diagnosis not present

## 2017-03-20 DIAGNOSIS — Z79899 Other long term (current) drug therapy: Secondary | ICD-10-CM | POA: Diagnosis not present

## 2017-03-20 DIAGNOSIS — Z6831 Body mass index (BMI) 31.0-31.9, adult: Secondary | ICD-10-CM | POA: Diagnosis not present

## 2017-03-20 DIAGNOSIS — I10 Essential (primary) hypertension: Secondary | ICD-10-CM | POA: Insufficient documentation

## 2017-03-20 DIAGNOSIS — T847XXS Infection and inflammatory reaction due to other internal orthopedic prosthetic devices, implants and grafts, sequela: Secondary | ICD-10-CM | POA: Diagnosis not present

## 2017-03-20 DIAGNOSIS — T84629A Infection and inflammatory reaction due to internal fixation device of unspecified bone of leg, initial encounter: Secondary | ICD-10-CM | POA: Diagnosis not present

## 2017-03-20 DIAGNOSIS — M1990 Unspecified osteoarthritis, unspecified site: Secondary | ICD-10-CM | POA: Diagnosis not present

## 2017-03-20 DIAGNOSIS — Z7982 Long term (current) use of aspirin: Secondary | ICD-10-CM | POA: Diagnosis not present

## 2017-03-20 DIAGNOSIS — T8469XA Infection and inflammatory reaction due to internal fixation device of other site, initial encounter: Secondary | ICD-10-CM | POA: Diagnosis present

## 2017-03-20 HISTORY — PX: HARDWARE REMOVAL: SHX979

## 2017-03-20 LAB — CBC
HEMATOCRIT: 30.6 % — AB (ref 39.0–52.0)
HEMOGLOBIN: 10.4 g/dL — AB (ref 13.0–17.0)
MCH: 27.4 pg (ref 26.0–34.0)
MCHC: 34 g/dL (ref 30.0–36.0)
MCV: 80.5 fL (ref 78.0–100.0)
Platelets: 340 10*3/uL (ref 150–400)
RBC: 3.8 MIL/uL — AB (ref 4.22–5.81)
RDW: 15.1 % (ref 11.5–15.5)
WBC: 8.5 10*3/uL (ref 4.0–10.5)

## 2017-03-20 LAB — BASIC METABOLIC PANEL
Anion gap: 11 (ref 5–15)
BUN: 15 mg/dL (ref 6–20)
CHLORIDE: 101 mmol/L (ref 101–111)
CO2: 22 mmol/L (ref 22–32)
Calcium: 9.4 mg/dL (ref 8.9–10.3)
Creatinine, Ser: 0.92 mg/dL (ref 0.61–1.24)
GFR calc non Af Amer: >60 mL/min (ref >60)
Glucose, Bld: 94 mg/dL (ref 65–99)
POTASSIUM: 3.7 mmol/L (ref 3.5–5.1)
SODIUM: 134 mmol/L — AB (ref 135–145)

## 2017-03-20 LAB — URIC ACID: Uric Acid, Serum: 8.5 mg/dL — ABNORMAL HIGH (ref 4.0–8.0)

## 2017-03-20 SURGERY — REMOVAL, HARDWARE
Anesthesia: General | Laterality: Right

## 2017-03-20 MED ORDER — ONDANSETRON HCL 4 MG/2ML IJ SOLN
INTRAMUSCULAR | Status: DC | PRN
  Administered 2017-03-20: 4 mg via INTRAVENOUS

## 2017-03-20 MED ORDER — ALLOPURINOL 100 MG PO TABS: 100.0000 mg | ORAL_TABLET | Freq: Two times a day (BID) | ORAL | 3 refills | Status: DC

## 2017-03-20 MED ORDER — FENTANYL CITRATE (PF) 250 MCG/5ML IJ SOLN
INTRAMUSCULAR | Status: AC
  Filled 2017-03-20: qty 5

## 2017-03-20 MED ORDER — COLCHICINE 0.6 MG PO TABS: 0.6000 mg | ORAL_TABLET | Freq: Two times a day (BID) | ORAL | 3 refills | Status: DC

## 2017-03-20 MED ORDER — LIDOCAINE HCL (CARDIAC) 20 MG/ML IV SOLN
INTRAVENOUS | Status: DC | PRN
  Administered 2017-03-20: 100 mg via INTRAVENOUS

## 2017-03-20 MED ORDER — OXYCODONE-ACETAMINOPHEN 5-325 MG PO TABS: 1.0000 | ORAL_TABLET | ORAL | 0 refills | Status: DC | PRN

## 2017-03-20 MED ORDER — PROPOFOL 10 MG/ML IV BOLUS
INTRAVENOUS | Status: AC
  Filled 2017-03-20: qty 20

## 2017-03-20 MED ORDER — DEXAMETHASONE SODIUM PHOSPHATE 10 MG/ML IJ SOLN
INTRAMUSCULAR | Status: DC | PRN
  Administered 2017-03-20: 10 mg via INTRAVENOUS

## 2017-03-20 MED ORDER — MIDAZOLAM HCL 2 MG/2ML IJ SOLN
INTRAMUSCULAR | Status: AC
  Filled 2017-03-20: qty 2

## 2017-03-20 MED ORDER — CHLORHEXIDINE GLUCONATE 4 % EX LIQD: 60.0000 mL | Freq: Once | CUTANEOUS | Status: DC

## 2017-03-20 MED ORDER — OXYCODONE-ACETAMINOPHEN 5-325 MG PO TABS
1.0000 | ORAL_TABLET | Freq: Once | ORAL | Status: AC
  Administered 2017-03-20: 1 via ORAL

## 2017-03-20 MED ORDER — MIDAZOLAM HCL 5 MG/5ML IJ SOLN
INTRAMUSCULAR | Status: DC | PRN
  Administered 2017-03-20: 2 mg via INTRAVENOUS

## 2017-03-20 MED ORDER — LACTATED RINGERS IV SOLN
INTRAVENOUS | Status: DC | PRN
  Administered 2017-03-20: 07:00:00 via INTRAVENOUS

## 2017-03-20 MED ORDER — FENTANYL CITRATE (PF) 100 MCG/2ML IJ SOLN
INTRAMUSCULAR | Status: AC
Start: 2017-03-20 — End: 2017-03-20
  Administered 2017-03-20: 50 ug via INTRAVENOUS
  Filled 2017-03-20: qty 2

## 2017-03-20 MED ORDER — OXYCODONE-ACETAMINOPHEN 5-325 MG PO TABS
ORAL_TABLET | ORAL | Status: DC
Start: 2017-03-20 — End: 2017-03-20
  Filled 2017-03-20: qty 1

## 2017-03-20 MED ORDER — ACETAMINOPHEN 500 MG PO TABS
1000.0000 mg | ORAL_TABLET | Freq: Once | ORAL | Status: AC
  Administered 2017-03-20: 1000 mg via ORAL
  Filled 2017-03-20: qty 2

## 2017-03-20 MED ORDER — FENTANYL CITRATE (PF) 100 MCG/2ML IJ SOLN
25.0000 ug | INTRAMUSCULAR | Status: DC | PRN
  Administered 2017-03-20 (×3): 50 ug via INTRAVENOUS

## 2017-03-20 MED ORDER — GABAPENTIN 300 MG PO CAPS
300.0000 mg | ORAL_CAPSULE | Freq: Once | ORAL | Status: AC
  Administered 2017-03-20: 300 mg via ORAL
  Filled 2017-03-20: qty 1

## 2017-03-20 MED ORDER — ROCURONIUM BROMIDE 100 MG/10ML IV SOLN
INTRAVENOUS | Status: DC | PRN
  Administered 2017-03-20: 35 mg via INTRAVENOUS

## 2017-03-20 MED ORDER — CELECOXIB 200 MG PO CAPS
200.0000 mg | ORAL_CAPSULE | Freq: Once | ORAL | Status: AC
  Administered 2017-03-20: 200 mg via ORAL
  Filled 2017-03-20: qty 1

## 2017-03-20 MED ORDER — FENTANYL CITRATE (PF) 100 MCG/2ML IJ SOLN
INTRAMUSCULAR | Status: DC | PRN
  Administered 2017-03-20 (×2): 50 ug via INTRAVENOUS
  Administered 2017-03-20: 100 ug via INTRAVENOUS
  Administered 2017-03-20: 50 ug via INTRAVENOUS

## 2017-03-20 MED ORDER — PROPOFOL 10 MG/ML IV BOLUS
INTRAVENOUS | Status: DC | PRN
  Administered 2017-03-20: 150 mg via INTRAVENOUS

## 2017-03-20 MED ORDER — FENTANYL CITRATE (PF) 100 MCG/2ML IJ SOLN
INTRAMUSCULAR | Status: AC
  Filled 2017-03-20: qty 2

## 2017-03-20 MED ORDER — PHENYLEPHRINE HCL 10 MG/ML IJ SOLN
INTRAMUSCULAR | Status: DC | PRN
  Administered 2017-03-20: 80 ug via INTRAVENOUS

## 2017-03-20 MED ORDER — PROMETHAZINE HCL 25 MG/ML IJ SOLN: 6.2500 mg | INTRAMUSCULAR | Status: DC | PRN

## 2017-03-20 MED ORDER — SUGAMMADEX SODIUM 200 MG/2ML IV SOLN
INTRAVENOUS | Status: DC | PRN
  Administered 2017-03-20: 200 mg via INTRAVENOUS

## 2017-03-20 SURGICAL SUPPLY — 42 items
BANDAGE ESMARK 6X9 LF (GAUZE/BANDAGES/DRESSINGS) IMPLANT
BNDG COHESIVE 4X5 TAN STRL (GAUZE/BANDAGES/DRESSINGS) IMPLANT
BNDG ESMARK 6X9 LF (GAUZE/BANDAGES/DRESSINGS)
BNDG GAUZE ELAST 4 BULKY (GAUZE/BANDAGES/DRESSINGS) ×3 IMPLANT
COVER SURGICAL LIGHT HANDLE (MISCELLANEOUS) ×3 IMPLANT
CUFF TOURNIQUET SINGLE 34IN LL (TOURNIQUET CUFF) IMPLANT
CUFF TOURNIQUET SINGLE 44IN (TOURNIQUET CUFF) IMPLANT
DRAPE C-ARM 42X72 X-RAY (DRAPES) IMPLANT
DRAPE INCISE IOBAN 66X45 STRL (DRAPES) IMPLANT
DRAPE ORTHO SPLIT 77X108 STRL (DRAPES)
DRAPE SURG ORHT 6 SPLT 77X108 (DRAPES) IMPLANT
DRAPE U-SHAPE 47X51 STRL (DRAPES) ×3 IMPLANT
DRSG EMULSION OIL 3X3 NADH (GAUZE/BANDAGES/DRESSINGS) ×3 IMPLANT
DRSG PAD ABDOMINAL 8X10 ST (GAUZE/BANDAGES/DRESSINGS) ×3 IMPLANT
DURAPREP 26ML APPLICATOR (WOUND CARE) ×3 IMPLANT
ELECT REM PT RETURN 9FT ADLT (ELECTROSURGICAL) ×3
ELECTRODE REM PT RTRN 9FT ADLT (ELECTROSURGICAL) ×1 IMPLANT
GAUZE SPONGE 4X4 12PLY STRL (GAUZE/BANDAGES/DRESSINGS) ×3 IMPLANT
GLOVE BIOGEL PI IND STRL 9 (GLOVE) ×1 IMPLANT
GLOVE BIOGEL PI INDICATOR 9 (GLOVE) ×2
GLOVE SURG ORTHO 9.0 STRL STRW (GLOVE) ×3 IMPLANT
GOWN STRL REUS W/ TWL XL LVL3 (GOWN DISPOSABLE) ×3 IMPLANT
GOWN STRL REUS W/TWL XL LVL3 (GOWN DISPOSABLE) ×6
KIT BASIN OR (CUSTOM PROCEDURE TRAY) ×3 IMPLANT
KIT PREVENA INCISION MGT 13 (CANNISTER) ×3 IMPLANT
KIT ROOM TURNOVER OR (KITS) ×3 IMPLANT
MANIFOLD NEPTUNE II (INSTRUMENTS) ×3 IMPLANT
NS IRRIG 1000ML POUR BTL (IV SOLUTION) ×3 IMPLANT
PACK ORTHO EXTREMITY (CUSTOM PROCEDURE TRAY) ×3 IMPLANT
PAD ARMBOARD 7.5X6 YLW CONV (MISCELLANEOUS) ×6 IMPLANT
SPONGE LAP 18X18 X RAY DECT (DISPOSABLE) IMPLANT
STAPLER VISISTAT 35W (STAPLE) IMPLANT
STOCKINETTE IMPERVIOUS 9X36 MD (GAUZE/BANDAGES/DRESSINGS) IMPLANT
SUT ETHILON 2 0 PSLX (SUTURE) IMPLANT
SUT VIC AB 0 CT1 27 (SUTURE)
SUT VIC AB 0 CT1 27XBRD ANBCTR (SUTURE) IMPLANT
SUT VIC AB 2-0 CT1 27 (SUTURE)
SUT VIC AB 2-0 CT1 TAPERPNT 27 (SUTURE) IMPLANT
TOWEL OR 17X24 6PK STRL BLUE (TOWEL DISPOSABLE) IMPLANT
TOWEL OR 17X26 10 PK STRL BLUE (TOWEL DISPOSABLE) ×3 IMPLANT
UNDERPAD 30X30 (UNDERPADS AND DIAPERS) ×3 IMPLANT
WATER STERILE IRR 1000ML POUR (IV SOLUTION) IMPLANT

## 2017-03-20 NOTE — Transfer of Care (Signed)
Immediate Anesthesia Transfer of Care Note  Patient: Isidor Caban  Procedure(s) Performed: Procedure(s): Remove Hardware, Irrigation and Debridement Right Calcaneus (Right)  Patient Location: PACU  Anesthesia Type:General  Level of Consciousness: awake, alert , oriented and patient cooperative  Airway & Oxygen Therapy: Patient Spontanous Breathing and Patient connected to nasal cannula oxygen  Post-op Assessment: Report given to RN and Post -op Vital signs reviewed and stable  Post vital signs: Reviewed and stable  Last Vitals:  Vitals:   03/20/17 0613  BP: (!) 119/92  Pulse: 99  Resp: 20  Temp: 36.8 C  SpO2: 100%    Last Pain:  Vitals:   03/20/17 0626  TempSrc:   PainSc: 8       Patients Stated Pain Goal: 2 (03/20/17 0626)  Complications: No apparent anesthesia complications

## 2017-03-20 NOTE — Anesthesia Procedure Notes (Signed)
Procedure Name: Intubation Date/Time: 03/20/2017 7:40 AM Performed by: Shirlyn Goltz Pre-anesthesia Checklist: Patient identified, Emergency Drugs available, Suction available and Patient being monitored Patient Re-evaluated:Patient Re-evaluated prior to induction Oxygen Delivery Method: Circle system utilized Preoxygenation: Pre-oxygenation with 100% oxygen Induction Type: IV induction Ventilation: Mask ventilation without difficulty Laryngoscope Size: Mac and 3 Grade View: Grade III Tube type: Oral Tube size: 7.0 mm Number of attempts: 1 Airway Equipment and Method: Stylet Placement Confirmation: ETT inserted through vocal cords under direct vision,  positive ETCO2 and breath sounds checked- equal and bilateral Secured at: 21 cm Tube secured with: Tape Dental Injury: Teeth and Oropharynx as per pre-operative assessment

## 2017-03-20 NOTE — Op Note (Signed)
03/20/2017  8:24 AM  PATIENT:  Charles Blankenship    PRE-OPERATIVE DIAGNOSIS:  Status Post Open Reduction Internal Fixation Right Calcaneus Fracture  POST-OPERATIVE DIAGNOSIS:  Same  PROCEDURE:  Remove Hardware, Irrigation and Debridement Right Calcaneus With excision of soft tissue and bone with a Ronjair. Placement of Prevena wound VAC.  SURGEON:  [name], MD  PHYSICIAN ASSISTANT:None ANESTHESIA:   General  PREOPERATIVE INDICATIONS:  [name] is a  56 y.o. male with a diagnosis of Status Post Open Reduction Internal Fixation Right Calcaneus Fracture who failed conservative measures and elected for surgical management.    The risks benefits and alternatives were discussed with the patient preoperatively including but not limited to the risks of infection, bleeding, nerve injury, cardiopulmonary complications, the need for revision surgery, among others, and the patient was willing to proceed.  OPERATIVE IMPLANTS: Removal of calcaneal plate and screws with placement of Prevena wound VAC  OPERATIVE FINDINGS: Small purulent drainage. Cultures obtained of fluid and tissue.  OPERATIVE PROCEDURE: Patient was brought to the operating room and underwent a general anesthetic. After adequate levels anesthesia obtained patient was placed in the left lateral decubitus position with the right side up and the right lower extremity was prepped using DuraPrep draped into a sterile field a timeout was called. The previous lateral extensile incision was used this was carried down to bone. Subperiosteal dissection was used to elevate the flap. The screws and plate were removed. A Ronjair was used to remove nonviable soft tissue. The bone was also debrided with a Ronjair. The wound was irrigated with normal saline. There was good petechial bleeding the tissue was healthy. The incision was closed using a modified Algower Donati suture technique. A Prevena wound VAC was applied patient was extubated  taken to the PACU in stable condition.

## 2017-03-20 NOTE — Anesthesia Postprocedure Evaluation (Signed)
Anesthesia Post Note  Patient: Damel Certo  Procedure(s) Performed: Procedure(s) (LRB): Remove Hardware, Irrigation and Debridement Right Calcaneus (Right)     Patient location during evaluation: PACU Anesthesia Type: General Level of consciousness: awake and alert Pain management: pain level controlled Vital Signs Assessment: post-procedure vital signs reviewed and stable Respiratory status: spontaneous breathing, nonlabored ventilation and respiratory function stable Cardiovascular status: blood pressure returned to baseline and stable Postop Assessment: no signs of nausea or vomiting Anesthetic complications: no    Last Vitals:  Vitals:   03/20/17 0900 03/20/17 0903  BP:  (!) 143/92  Pulse: (!) 102 90  Resp: (!) 24   Temp:  36.4 C  SpO2: 97% 96%    Last Pain:  Vitals:   03/20/17 0915  TempSrc:   PainSc: Asleep                 Cecile Hearing

## 2017-03-20 NOTE — H&P (Signed)
Charles Blankenship is an 56 y.o. male.   Chief Complaint: Infection status post open reduction internal fixation right calcaneus fracture HPI: Charles Blankenship is a 56 year old gentleman with undiagnosed gout who underwent open reduction internal fixation for calcaneus fracture. Charles Blankenship is a smoker his leg was dependent and developed increased swelling wound dehiscence and subsequent infection.  Past Medical History:  Diagnosis Date  . Arthritis   . Gout   . Hyperlipemia   . Hypertension     Past Surgical History:  Procedure Laterality Date  . DG FOOT HEEL ([facility] HX) Right   . EYE SURGERY     pt had right eye removed  . JOINT REPLACEMENT Right 2016   Hip Replacement  . ORIF CALCANEOUS FRACTURE Right 01/23/2017   Procedure: OPEN REDUCTION INTERNAL FIXATION (ORIF) RIGHT CALCANEOUS FRACTURE;  Surgeon: [name], MD;  Location: [facility] OR;  Service: Orthopedics;  Laterality: Right;    History reviewed. No pertinent family history. Social History:  reports that he has been smoking Cigarettes.  He has a 6.25 pack-year smoking history. He has never used smokeless tobacco. He reports that he does not drink alcohol or use drugs.  Allergies: No Known Allergies  Medications Prior to Admission  Medication Sig Dispense Refill  . aspirin EC 81 MG tablet Take 81 mg by mouth daily.    . cyclobenzaprine (FLEXERIL) 10 MG tablet Take 10 mg by mouth at bedtime.     . diphenhydrAMINE (BENADRYL) 25 MG tablet Take 25 mg by mouth 2 (two) times daily.     Marland Kitchen doxepin (SINEQUAN) 25 MG capsule Take 25 mg by mouth at bedtime.    Marland Kitchen doxycycline (VIBRA-TABS) 100 MG tablet Take 1 tablet (100 mg total) by mouth 2 (two) times daily. 60 tablet 0  . gabapentin (NEURONTIN) 300 MG capsule Take 300 mg by mouth at bedtime.    Marland Kitchen ibuprofen (ADVIL,MOTRIN) 800 MG tablet Take 800 mg by mouth every 8 (eight) hours as needed for headache or moderate pain.    Marland Kitchen lisinopril-hydrochlorothiazide (PRINZIDE,ZESTORETIC) 20-12.5 MG per tablet Take 1  tablet by mouth daily.    . Multiple Vitamin (MULTIVITAMIN WITH MINERALS) TABS tablet Take 1 tablet by mouth daily.    Marland Kitchen oxyCODONE-acetaminophen (PERCOCET/ROXICET) 5-325 MG tablet Take 1 tablet by mouth every 8 (eight) hours as needed for severe pain. 21 tablet 0  . pravastatin (PRAVACHOL) 20 MG tablet Take 20 mg by mouth at bedtime.    . vitamin B-12 (CYANOCOBALAMIN) 500 MCG tablet Take 500 mcg by mouth daily.    . sildenafil (REVATIO) 20 MG tablet TAKE 40 MG BY MOUTH APPROXIMATELY 30 MINUTES PRIOR TO SEXUAL ACTIVITY AS NEEDED  3    Results for orders placed or performed in visit on 03/19/17 (from the past 48 hour(s))  Uric acid     Status: Abnormal   Collection Time: 03/19/17 10:45 AM  Result Value Ref Range   Uric Acid, Serum 8.5 (H) 4.0 - 8.0 mg/dL   No results found.  Review of Systems  All other systems reviewed and are negative.   Blood pressure (!) 119/92, pulse 99, temperature 98.2 F (36.8 C), temperature source Oral, resp. rate 20, height 6' (1.829 m), weight 232 lb (105.2 kg), SpO2 100 %. Physical Exam  Examination Charles Blankenship has pain great toe MTP joint with redness and swelling. He has swelling in the foot laterally the wound has dehisced secondary to the swelling. There is a very small drop of purulent drainage. Assessment/Plan Assessment: Wound dehiscence with infection  right foot status post open reduction internal fixation for calcaneus fracture.  Plan: We will plan for removal of deep retained hardware. Irrigation and debridement wound closure and placement of a wound VAC. Risks and benefits were discussed including persistent infection. Charles Blankenship states he understands wishes to proceed at this time.  [name], MD 03/20/2017, 6:31 AM

## 2017-03-20 NOTE — OR Nursing (Signed)
Hardware was removed from the right calcaneous by Dr. Lajoyce Corners.  Dr. Lajoyce Corners gave a verbal order for the hardware to be returned to the patient.  Hardware was placed in a specimen container which was then placed in a biohazard bag.  The retained hardware was taken to the PACU and given to the PACU staff to return to the patient.  Ninetta Lights, RN, BSN, [employer]

## 2017-03-20 NOTE — Anesthesia Preprocedure Evaluation (Addendum)
Anesthesia Evaluation  Patient identified by MRN, date of birth, ID band Patient awake    Reviewed: Allergy & Precautions, NPO status , Patient's Chart, lab work & pertinent test results  Airway Mallampati: II  TM Distance: >3 FB Neck ROM: Full    Dental  (+) Dental Advisory Given, Partial Upper   Pulmonary Current Smoker,    breath sounds clear to auscultation       Cardiovascular hypertension, Pt. on medications  Rhythm:Regular Rate:Normal     Neuro/Psych negative neurological ROS  negative psych ROS   GI/Hepatic negative GI ROS, Neg liver ROS,   Endo/Other  Obesity   Renal/GU negative Renal ROS     Musculoskeletal  (+) Arthritis ,   Abdominal   Peds  Hematology negative hematology ROS (+)   Anesthesia Other Findings   Reproductive/Obstetrics                            Anesthesia Physical  Anesthesia Plan  ASA: II  Anesthesia Plan: General   Post-op Pain Management:    Induction: Intravenous  PONV Risk Score and Plan: 1 and Ondansetron and Dexamethasone  Airway Management Planned: LMA  Additional Equipment:   Intra-op Plan:   Post-operative Plan: Extubation in OR  Informed Consent: I have reviewed the patients History and Physical, chart, labs and discussed the procedure including the risks, benefits and alternatives for the proposed anesthesia with the patient or authorized representative who has indicated his/her understanding and acceptance.   Dental advisory given  Plan Discussed with: CRNA  Anesthesia Plan Comments:         Anesthesia Quick Evaluation

## 2017-03-21 ENCOUNTER — Encounter (HOSPITAL_COMMUNITY): Payer: Self-pay | Admitting: Orthopedic Surgery

## 2017-03-23 ENCOUNTER — Telehealth (INDEPENDENT_AMBULATORY_CARE_PROVIDER_SITE_OTHER): Payer: Self-pay | Admitting: Orthopedic Surgery

## 2017-03-23 ENCOUNTER — Encounter (INDEPENDENT_AMBULATORY_CARE_PROVIDER_SITE_OTHER): Payer: Self-pay | Admitting: Family

## 2017-03-23 ENCOUNTER — Ambulatory Visit (INDEPENDENT_AMBULATORY_CARE_PROVIDER_SITE_OTHER): Payer: Medicaid Other | Admitting: Family

## 2017-03-23 DIAGNOSIS — T847XXS Infection and inflammatory reaction due to other internal orthopedic prosthetic devices, implants and grafts, sequela: Secondary | ICD-10-CM

## 2017-03-23 DIAGNOSIS — S92011A Displaced fracture of body of right calcaneus, initial encounter for closed fracture: Secondary | ICD-10-CM

## 2017-03-23 DIAGNOSIS — M1A0710 Idiopathic chronic gout, right ankle and foot, without tophus (tophi): Secondary | ICD-10-CM

## 2017-03-23 MED ORDER — COLCHICINE 0.6 MG PO CAPS: ORAL_CAPSULE | ORAL | 3 refills | Status: DC

## 2017-03-23 NOTE — Progress Notes (Signed)
Pt advised while in office for post op appt today.

## 2017-03-23 NOTE — Progress Notes (Signed)
   Post-Op Visit Note   Patient: Charles Blankenship           Date of Birth: 16-Mar-1961           MRN: [id number] Visit Date: 03/23/2017 PCP: Tylene Fantasia., PA-C  Chief Complaint:  Chief Complaint  Patient presents with  . Right Foot - Routine Post Op    HPI:  The patient is a 56 year old gentleman seen 3 days status post removal of hardware right heel. Has wound vac in place. Is draining well. Complains the wound vac is painful and aggravating.   States needs prior authorization for his colchicine.   Uric acid drawn last week was an 8.    Ortho Exam Incision well approximated with sutures there is no gaping or drainage does have some maceration along the incision no odor no erythema or sign of infection. There is moderate swelling to the ankle however it is improved since examination a week ago today.  Visit Diagnoses:  1. Displaced fracture of body of right calcaneus, initial encounter for closed fracture   2. Infected hardware in right lower extremity, sequela   3. Chronic idiopathic gout involving toe of right foot without tophus     Plan: Begin daily Dial soap cleansing. Apply dry dressings and change dressing daily. Elevate around the clock. Will obtain prior authorization for his colchicine today. Follow-up in office in 2 weeks. Complete course of antibiotics.  Follow-Up Instructions: Return in about 2 weeks (around 04/06/2017).   Imaging: No results found.  Orders:  No orders of the defined types were placed in this encounter.  No orders of the defined types were placed in this encounter.    PMFS History: Patient Active Problem List   Diagnosis Date Noted  . Chronic idiopathic gout involving toe of right foot without tophus 03/19/2017  . Infected hardware in right leg (HCC) 03/19/2017  . Displaced fracture of body of right calcaneus, initial encounter for closed fracture    Past Medical History:  Diagnosis Date  . Arthritis   . Gout   . Hyperlipemia   .  Hypertension     History reviewed. No pertinent family history.  Past Surgical History:  Procedure Laterality Date  . DG FOOT HEEL ([facility] HX) Right   . EYE SURGERY     pt had right eye removed  . HARDWARE REMOVAL Right 03/20/2017   Procedure: Remove Hardware, Irrigation and Debridement Right Calcaneus;  Surgeon: [name], MD;  Location: [facility] OR;  Service: Orthopedics;  Laterality: Right;  . JOINT REPLACEMENT Right 2016   Hip Replacement  . ORIF CALCANEOUS FRACTURE Right 01/23/2017   Procedure: OPEN REDUCTION INTERNAL FIXATION (ORIF) RIGHT CALCANEOUS FRACTURE;  Surgeon: [name], MD;  Location: [facility] OR;  Service: Orthopedics;  Laterality: Right;   Social History   Occupational History  . Not on file.   Social History Main Topics  . Smoking status: Current Every Day Smoker    Packs/day: 0.25    Years: 25.00    Types: Cigarettes  . Smokeless tobacco: Never Used  . Alcohol use No  . Drug use: No  . Sexual activity: Yes

## 2017-03-23 NOTE — Telephone Encounter (Signed)
Insurance will not cover colchicine tablets, costing patient 381 dollars out of pocket. Will cover colchicine capsules, with 3 dollar copay. New rx for capsule sent into pharmacy.

## 2017-03-25 LAB — AEROBIC/ANAEROBIC CULTURE W GRAM STAIN (SURGICAL/DEEP WOUND)

## 2017-03-25 LAB — AEROBIC/ANAEROBIC CULTURE (SURGICAL/DEEP WOUND): GRAM STAIN: NONE SEEN

## 2017-03-26 ENCOUNTER — Telehealth (INDEPENDENT_AMBULATORY_CARE_PROVIDER_SITE_OTHER): Payer: Self-pay | Admitting: Orthopedic Surgery

## 2017-03-26 ENCOUNTER — Other Ambulatory Visit (INDEPENDENT_AMBULATORY_CARE_PROVIDER_SITE_OTHER): Payer: Self-pay | Admitting: Family

## 2017-03-26 MED ORDER — OXYCODONE-ACETAMINOPHEN 5-325 MG PO TABS: 1.0000 | ORAL_TABLET | Freq: Four times a day (QID) | ORAL | 0 refills | Status: DC | PRN

## 2017-03-26 NOTE — Telephone Encounter (Signed)
Pt requesting refill on percocet. Last refill was 03/20/17 #30 and this was a 5 day supply.

## 2017-03-26 NOTE — Telephone Encounter (Signed)
Patient called needing Rx refilled (Percocet) The number to contact patient is [phone]

## 2017-03-27 ENCOUNTER — Ambulatory Visit (INDEPENDENT_AMBULATORY_CARE_PROVIDER_SITE_OTHER): Payer: Medicaid Other | Admitting: Family

## 2017-03-27 NOTE — Telephone Encounter (Signed)
Charles Blankenship has advised rx at the front desk for pick up.

## 2017-03-30 ENCOUNTER — Ambulatory Visit (INDEPENDENT_AMBULATORY_CARE_PROVIDER_SITE_OTHER): Payer: Medicaid Other | Admitting: Family

## 2017-04-02 ENCOUNTER — Telehealth (INDEPENDENT_AMBULATORY_CARE_PROVIDER_SITE_OTHER): Payer: Self-pay

## 2017-04-02 ENCOUNTER — Other Ambulatory Visit (INDEPENDENT_AMBULATORY_CARE_PROVIDER_SITE_OTHER): Payer: Self-pay | Admitting: Orthopedic Surgery

## 2017-04-02 MED ORDER — OXYCODONE-ACETAMINOPHEN 5-325 MG PO TABS: 1.0000 | ORAL_TABLET | Freq: Four times a day (QID) | ORAL | 0 refills | Status: DC | PRN

## 2017-04-02 NOTE — Telephone Encounter (Signed)
Patient called again concerning Rx refill.  Advised patient of message concerning Rx.

## 2017-04-02 NOTE — Telephone Encounter (Signed)
Left voicemail advising patient rx is at the front desk, however he will not be able to fill this until 04/07/17. He was just given a 10 day supply of oxycodone on 03/26/17. Insurance will not cover him to fill this early, needs to wait until appropriate time and be weaning himself off this medication all together.

## 2017-04-02 NOTE — Telephone Encounter (Signed)
Patient called to check status of Rx refill.

## 2017-04-02 NOTE — Telephone Encounter (Signed)
Patient would like a Rx refill on Percocet.  Cb# is [phone].  Please advise.  Thank you.

## 2017-04-07 ENCOUNTER — Encounter (INDEPENDENT_AMBULATORY_CARE_PROVIDER_SITE_OTHER): Payer: Self-pay | Admitting: Orthopedic Surgery

## 2017-04-07 ENCOUNTER — Ambulatory Visit (INDEPENDENT_AMBULATORY_CARE_PROVIDER_SITE_OTHER): Payer: Medicaid Other | Admitting: Orthopedic Surgery

## 2017-04-07 DIAGNOSIS — S92011A Displaced fracture of body of right calcaneus, initial encounter for closed fracture: Secondary | ICD-10-CM

## 2017-04-07 MED ORDER — COLCHICINE 0.6 MG PO CAPS: 0.6000 mg | ORAL_CAPSULE | Freq: Two times a day (BID) | ORAL | 1 refills | Status: DC | PRN

## 2017-04-07 NOTE — Progress Notes (Signed)
Office Visit Note   Patient: Charles Blankenship           Date of Birth: [birth year]           MRN: [id number] Visit Date: 04/07/2017              Requested by: Tylene Fantasia., PA-C [address] [place], [place] [address] PCP: Tylene Fantasia., PA-C  Chief Complaint  Patient presents with  . postop    Rt heel      HPI: Patient presents status post removal of deep retained hardware for infection calcaneal internal fixation. Patient is still on doxycycline.  Assessment & Plan: Visit Diagnoses:  1. Displaced fracture of body of right calcaneus, initial encounter for closed fracture     Plan: The patient was given a refill prescription for his Percocet today he will continue with the doxycycline start Dial soap cleansing he will wear the medical compression sock around-the-clock to decrease swelling the importance of elevation was   Follow- follow-up in 1 week to harvest the sutures. Continue with the medical compression socks nonweightbearing.  Up Instructions: Return in about 1 week (around 04/14/2017).   Ortho Exam  Patient is alert, oriented, no adenopathy, well-dressed, normal affect, normal respiratory effort.  examination patient's foot does show increased swelling there is no drainage no cellulitis no odor. The wound is healing quite nicely the area of skin breakdown before it is not open. Imaging: No results found. No images are attached to the encounter.  Labs: Lab Results  Component Value Date   LABURIC 8.5 (H) 03/19/2017   REPTSTATUS 03/25/2017 FINAL 03/20/2017   GRAMSTAIN NO WBC SEEN NO ORGANISMS SEEN  03/20/2017   CULT  03/20/2017    RARE STAPHYLOCOCCUS AUREUS CRITICAL RESULT CALLED TO, READ BACK BY AND VERIFIED WITH: DR Joette Catching ORTHO AT 1128 03/21/17 BY L BENFIELD CONCERNING GROWTH ON CULTURE NO ANAEROBES ISOLATED    LABORGA STAPHYLOCOCCUS AUREUS 03/20/2017    Orders:  No orders of the defined types were placed in this  encounter.  Meds ordered this encounter  Medications  . Colchicine 0.6 MG CAPS    Sig: Take 0.6 mg by mouth 2 (two) times daily as needed.    Dispense:  60 capsule    Refill:  1     Procedures: No procedures performed  Clinical Data: No additional findings.  ROS:  All other systems negative, except as noted in the HPI. Review of Systems  Objective: Vital Signs: There were no vitals taken for this visit.  Specialty Comments:  No specialty comments available.  PMFS History: Patient Active Problem List   Diagnosis Date Noted  . Chronic idiopathic gout involving toe of right foot without tophus 03/19/2017  . Infected hardware in right leg (HCC) 03/19/2017  . Displaced fracture of body of right calcaneus, initial encounter for closed fracture    Past Medical History:  Diagnosis Date  . Arthritis   . Gout   . Hyperlipemia   . Hypertension     No family history on file.  Past Surgical History:  Procedure Laterality Date  . DG FOOT HEEL ([facility] HX) Right   . EYE SURGERY     pt had right eye removed  . HARDWARE REMOVAL Right 03/20/2017   Procedure: Remove Hardware, Irrigation and Debridement Right Calcaneus;  Surgeon: [name], MD;  Location: [facility] OR;  Service: Orthopedics;  Laterality: Right;  . JOINT REPLACEMENT Right 2016   Hip Replacement  . ORIF  CALCANEOUS FRACTURE Right 01/23/2017   Procedure: OPEN REDUCTION INTERNAL FIXATION (ORIF) RIGHT CALCANEOUS FRACTURE;  Surgeon: Nadara Mustarduda, Vernestine Brodhead V, MD;  Location: [facility] OR;  Service: Orthopedics;  Laterality: Right;   Social History   Occupational History  . Not on file.   Social History Main Topics  . Smoking status: Current Every Day Smoker    Packs/day: 0.25    Years: 25.00    Types: Cigarettes  . Smokeless tobacco: Never Used  . Alcohol use No  . Drug use: No  . Sexual activity: Yes

## 2017-04-08 ENCOUNTER — Ambulatory Visit (INDEPENDENT_AMBULATORY_CARE_PROVIDER_SITE_OTHER): Payer: Medicaid Other | Admitting: Family

## 2017-04-09 ENCOUNTER — Telehealth (INDEPENDENT_AMBULATORY_CARE_PROVIDER_SITE_OTHER): Payer: Self-pay | Admitting: Orthopedic Surgery

## 2017-04-09 NOTE — Telephone Encounter (Signed)
Patient called advised the insurance company will not allow his Rx to be filled without prior approval.  (Percocet) The number to contact patient  Is [phone]

## 2017-04-10 NOTE — Telephone Encounter (Signed)
Prior authorization was submitted and pending insurance approval.

## 2017-04-13 ENCOUNTER — Ambulatory Visit (INDEPENDENT_AMBULATORY_CARE_PROVIDER_SITE_OTHER): Payer: Self-pay

## 2017-04-13 ENCOUNTER — Encounter (INDEPENDENT_AMBULATORY_CARE_PROVIDER_SITE_OTHER): Payer: Self-pay | Admitting: Orthopedic Surgery

## 2017-04-13 ENCOUNTER — Ambulatory Visit (INDEPENDENT_AMBULATORY_CARE_PROVIDER_SITE_OTHER): Payer: Medicaid Other | Admitting: Orthopedic Surgery

## 2017-04-13 DIAGNOSIS — S92011A Displaced fracture of body of right calcaneus, initial encounter for closed fracture: Secondary | ICD-10-CM

## 2017-04-13 MED ORDER — OXYCODONE-ACETAMINOPHEN 5-325 MG PO TABS: 1.0000 | ORAL_TABLET | Freq: Three times a day (TID) | ORAL | 0 refills | Status: DC | PRN

## 2017-04-13 NOTE — Telephone Encounter (Signed)
Prescription was denied initially, resent prior authorization with new operative note and approved. PA # [id number].

## 2017-04-13 NOTE — Progress Notes (Signed)
Office Visit Note   Patient: Charles Blankenship           Date of Birth: 03/17/1961           MRN: [id number] Visit Date: 04/13/2017              Requested by: Tylene FantasiaMuse, Rochelle D., PA-C [address] [place], [place] [id number] PCP: Tylene FantasiaMuse, Rochelle D., PA-C  Chief Complaint  Patient presents with  . Right Foot - Routine Post Op      HPI: Patient presents status post removal of deep retained hardware for infection calcaneal internal fixation. Is 3 weeks out from this surgery. Has been non weight bearing with crutches.  Assessment & Plan: Visit Diagnoses:  1. Displaced fracture of body of right calcaneus, initial encounter for closed fracture     Plan: The patient was given a refill prescription for his Percocet today. continue Dial soap cleansing he will wear the medical compression sock around-the-clock to decrease swelling the importance of elevation was discussed. May begin weight bearing as tolerated in stiff soled walking shoes.     Follow Up Instructions: Return in about 3 weeks (around 05/04/2017).   Ortho Exam  Patient is alert, oriented, no adenopathy, well-dressed, normal affect, normal respiratory effort.  examination patient's foot does show increased swelling there is no drainage no cellulitis no odor. The wound is healing quite nicely the area of skin breakdown before it is not open. Imaging: No results found. No images are attached to the encounter.  Labs: Lab Results  Component Value Date   LABURIC 8.5 (H) 03/19/2017   REPTSTATUS 03/25/2017 FINAL 03/20/2017   GRAMSTAIN NO WBC SEEN NO ORGANISMS SEEN  03/20/2017   CULT  03/20/2017    RARE STAPHYLOCOCCUS AUREUS CRITICAL RESULT CALLED TO, READ BACK BY AND VERIFIED WITH: DR Joette CatchingYATES,[facility] ORTH[facility] AT 1128 03/21/17 BY L BENFIELD CONCERNING GROWTH ON CULTURE NO ANAEROBES ISOLATED    LABORGA STAPHYLOCOCCUS AUREUS 03/20/2017    Orders:  Orders Placed This Encounter  Procedures  . XR Foot Complete Right    Meds ordered this encounter  Medications  . oxyCODONE-acetaminophen (ROXICET) 5-325 MG tablet    Sig: Take 1 tablet by mouth every 8 (eight) hours as needed for severe pain.    Dispense:  21 tablet    Refill:  0    Do not fill until 04/07/17     Procedures: No procedures performed  Clinical Data: No additional findings.  ROS:  All other systems negative, except as noted in the HPI. Review of Systems  Constitutional: Negative for chills and fever.  Musculoskeletal: Positive for arthralgias and joint swelling.  Skin: Negative for color change.    Objective: Vital Signs: There were no vitals taken for this visit.  Specialty Comments:  No specialty comments available.  PMFS History: Patient Active Problem List   Diagnosis Date Noted  . Chronic idiopathic gout involving toe of right foot without tophus 03/19/2017  . Infected hardware in right leg (HCC) 03/19/2017  . Displaced fracture of body of right calcaneus, initial encounter for closed fracture    Past Medical History:  Diagnosis Date  . Arthritis   . Gout   . Hyperlipemia   . Hypertension     No family history on file.  Past Surgical History:  Procedure Laterality Date  . DG FOOT HEEL ([facility] HX) Right   . EYE SURGERY     pt had right eye removed  . HARDWARE REMOVAL Right 03/20/2017  Procedure: Remove Hardware, Irrigation and Debridement Right Calcaneus;  Surgeon: [name], MD;  Location: [facility] OR;  Service: Orthopedics;  Laterality: Right;  . JOINT REPLACEMENT Right 2016   Hip Replacement  . ORIF CALCANEOUS FRACTURE Right 01/23/2017   Procedure: OPEN REDUCTION INTERNAL FIXATION (ORIF) RIGHT CALCANEOUS FRACTURE;  Surgeon: [name], MD;  Location: [facility] OR;  Service: Orthopedics;  Laterality: Right;   Social History   Occupational History  . Not on file.   Social History Main Topics  . Smoking status: Current Every Day Smoker    Packs/day: 0.25    Years: 25.00    Types: Cigarettes  . Smokeless  tobacco: Never Used  . Alcohol use No  . Drug use: No  . Sexual activity: Yes

## 2017-04-16 ENCOUNTER — Ambulatory Visit (INDEPENDENT_AMBULATORY_CARE_PROVIDER_SITE_OTHER): Payer: Medicaid Other | Admitting: Orthopedic Surgery

## 2017-04-20 ENCOUNTER — Encounter (INDEPENDENT_AMBULATORY_CARE_PROVIDER_SITE_OTHER): Payer: Self-pay | Admitting: Orthopedic Surgery

## 2017-04-20 ENCOUNTER — Ambulatory Visit (INDEPENDENT_AMBULATORY_CARE_PROVIDER_SITE_OTHER): Payer: Medicaid Other | Admitting: Orthopedic Surgery

## 2017-04-20 DIAGNOSIS — T847XXS Infection and inflammatory reaction due to other internal orthopedic prosthetic devices, implants and grafts, sequela: Secondary | ICD-10-CM

## 2017-04-20 NOTE — Progress Notes (Signed)
Office Visit Note   Patient: Charles Blankenship           Date of Birth: 08-13-1960           MRN: [id number] Visit Date: 04/20/2017              Requested by: Tylene Fantasia., PA-C [address] [place], [place] [address] PCP: Tylene Fantasia., PA-C  Chief Complaint  Patient presents with  . Right Ankle - Follow-up    I&D Right Calcaneus 4 weeks post op      HPI: Patient presents 4 weeks status post removal hardware irrigation debridement right calcaneus. Patient states he still has some pain and swelling. He is wearing an extra large medical compression stocking.  Assessment & Plan: Visit Diagnoses:  1. Infected hardware in right lower extremity, sequela     Plan: Recommend patient decreased her a large medical compression stocking. Discussed the importance of decreasing the edema. Patient has completed his course of doxycycline will not need any more. The incision is well approximated well healed no cellulitis no signs of infection. Reviewed the patient's [place] controlled substance report which showed approximately 300 oxycodone tablets. Discussed the importance of stopping the narcotic pain medication and recommended nonsteroidal anti-inflammatories.  Follow-Up Instructions: Return in about 4 weeks (around 05/18/2017).   Ortho Exam  Patient is alert, oriented, no adenopathy, well-dressed, normal affect, normal respiratory effort. Examination the incision is well approximated there is swelling there is no cellulitis no odor no drainage no signs of infection. Patient has no dystrophic changes. Patient was shown how to properly put on a sock in this sock was put on without any pain or complications.  Imaging: No results found. No images are attached to the encounter.  Labs: Lab Results  Component Value Date   LABURIC 8.5 (H) 03/19/2017   REPTSTATUS 03/25/2017 FINAL 03/20/2017   GRAMSTAIN NO WBC SEEN NO ORGANISMS SEEN  03/20/2017   CULT  03/20/2017   RARE STAPHYLOCOCCUS AUREUS CRITICAL RESULT CALLED TO, READ BACK BY AND VERIFIED WITH: DR Joette Catching ORTHO AT 1128 03/21/17 BY L BENFIELD CONCERNING GROWTH ON CULTURE NO ANAEROBES ISOLATED    LABORGA STAPHYLOCOCCUS AUREUS 03/20/2017    Orders:  No orders of the defined types were placed in this encounter.  No orders of the defined types were placed in this encounter.    Procedures: No procedures performed  Clinical Data: No additional findings.  ROS:  All other systems negative, except as noted in the HPI. Review of Systems  Objective: Vital Signs: There were no vitals taken for this visit.  Specialty Comments:  No specialty comments available.  PMFS History: Patient Active Problem List   Diagnosis Date Noted  . Chronic idiopathic gout involving toe of right foot without tophus 03/19/2017  . Infected hardware in right leg (HCC) 03/19/2017  . Displaced fracture of body of right calcaneus, initial encounter for closed fracture    Past Medical History:  Diagnosis Date  . Arthritis   . Gout   . Hyperlipemia   . Hypertension     History reviewed. No pertinent family history.  Past Surgical History:  Procedure Laterality Date  . DG FOOT HEEL ([facility] HX) Right   . EYE SURGERY     pt had right eye removed  . HARDWARE REMOVAL Right 03/20/2017   Procedure: Remove Hardware, Irrigation and Debridement Right Calcaneus;  Surgeon: [name], MD;  Location: [facility] OR;  Service: Orthopedics;  Laterality: Right;  .  JOINT REPLACEMENT Right 2016   Hip Replacement  . ORIF CALCANEOUS FRACTURE Right 01/23/2017   Procedure: OPEN REDUCTION INTERNAL FIXATION (ORIF) RIGHT CALCANEOUS FRACTURE;  Surgeon: [name], MD;  Location: [facility] OR;  Service: Orthopedics;  Laterality: Right;   Social History   Occupational History  . Not on file.   Social History Main Topics  . Smoking status: Current Every Day Smoker    Packs/day: 0.25    Years: 25.00    Types: Cigarettes  . Smokeless  tobacco: Never Used  . Alcohol use No  . Drug use: No  . Sexual activity: Yes

## 2017-04-27 ENCOUNTER — Other Ambulatory Visit (INDEPENDENT_AMBULATORY_CARE_PROVIDER_SITE_OTHER): Payer: Self-pay | Admitting: Family

## 2017-04-27 MED ORDER — OXYCODONE-ACETAMINOPHEN 5-325 MG PO TABS: 1.0000 | ORAL_TABLET | Freq: Two times a day (BID) | ORAL | 0 refills | Status: DC | PRN

## 2017-05-11 ENCOUNTER — Other Ambulatory Visit (INDEPENDENT_AMBULATORY_CARE_PROVIDER_SITE_OTHER): Payer: Self-pay | Admitting: Family

## 2017-05-11 MED ORDER — OXYCODONE-ACETAMINOPHEN 5-325 MG PO TABS: 1.0000 | ORAL_TABLET | Freq: Every day | ORAL | 0 refills | Status: DC | PRN

## 2017-05-18 ENCOUNTER — Ambulatory Visit (INDEPENDENT_AMBULATORY_CARE_PROVIDER_SITE_OTHER): Payer: Medicaid Other | Admitting: Orthopedic Surgery

## 2017-05-18 ENCOUNTER — Encounter (INDEPENDENT_AMBULATORY_CARE_PROVIDER_SITE_OTHER): Payer: Self-pay | Admitting: Orthopedic Surgery

## 2017-05-18 DIAGNOSIS — T847XXS Infection and inflammatory reaction due to other internal orthopedic prosthetic devices, implants and grafts, sequela: Secondary | ICD-10-CM

## 2017-05-18 MED ORDER — OXYCODONE-ACETAMINOPHEN 5-325 MG PO TABS: 1.0000 | ORAL_TABLET | Freq: Every day | ORAL | 0 refills | Status: DC | PRN

## 2017-05-18 NOTE — Progress Notes (Signed)
Office Visit Note   Patient: Charles Blankenship           Date of Birth: [birth year]           MRN: [id number] Visit Date: 05/18/2017              Requested by: Tylene Fantasia., PA-C [address] [place], [place] [address] PCP: Tylene Fantasia., PA-C  Chief Complaint  Patient presents with  . Right Foot - Follow-up      HPI: Patient is a 55 year old gentleman presents follow-up status post removal of deep retained hardware secondary to infection from open reduction internal fixation right calcaneus fracture. Patient states he occasionally has some burning pain he states that taking 300 mg of the gabapentin causes him to be too sleepy. He is currently taking 200 mg at night. Patient has been wearing gauze and the compression stockings.  Assessment & Plan: Visit Diagnoses:  1. Infected hardware in right lower extremity, sequela     Plan: recommended continue the crutches recommended elevation nonweightbearing discussed the risks of wound not healing with weightbearing on the incision. Recommended wearing the compression stocking directly against the wound there is no signs of infection will not renew his antibiotics at this time recommended taking 300 mg of the gabapentin in a spreading this out throughout the day. Refill prescription for Percocet.  Follow-Up Instructions: Return in about 2 weeks (around 06/01/2017).   Ortho Exam  Patient is alert, oriented, no adenopathy, well-dressed, normal affect, normal respiratory effort. Examination the wound is healing quite well the swelling has gone down the wound has completely healed except for one area 7 x 25 mm and 0.1 mm deep. There is a very small drop of clear drainage there is no purulence no abscess no odor no signs of infection.  Imaging: No results found. No images are attached to the encounter.  Labs: Lab Results  Component Value Date   LABURIC 8.5 (H) 03/19/2017   REPTSTATUS 03/25/2017 FINAL 03/20/2017   GRAMSTAIN NO WBC SEEN NO ORGANISMS SEEN  03/20/2017   CULT  03/20/2017    RARE STAPHYLOCOCCUS AUREUS CRITICAL RESULT CALLED TO, READ BACK BY AND VERIFIED WITH: DR Joette Catching ORTHO AT 1128 03/21/17 BY L BENFIELD CONCERNING GROWTH ON CULTURE NO ANAEROBES ISOLATED    LABORGA STAPHYLOCOCCUS AUREUS 03/20/2017    Orders:  No orders of the defined types were placed in this encounter.  Meds ordered this encounter  Medications  . oxyCODONE-acetaminophen (ROXICET) 5-325 MG tablet    Sig: Take 1 tablet by mouth daily as needed for severe pain.    Dispense:  10 tablet    Refill:  0     Procedures: No procedures performed  Clinical Data: No additional findings.  ROS:  All other systems negative, except as noted in the HPI. Review of Systems  Objective: Vital Signs: There were no vitals taken for this visit.  Specialty Comments:  No specialty comments available.  PMFS History: Patient Active Problem List   Diagnosis Date Noted  . Chronic idiopathic gout involving toe of right foot without tophus 03/19/2017  . Infected hardware in right leg (HCC) 03/19/2017  . Displaced fracture of body of right calcaneus, initial encounter for closed fracture    Past Medical History:  Diagnosis Date  . Arthritis   . Gout   . Hyperlipemia   . Hypertension     No family history on file.  Past Surgical History:  Procedure Laterality Date  . DG  FOOT HEEL ([facility] HX) Right   . EYE SURGERY     pt had right eye removed  . HARDWARE REMOVAL Right 03/20/2017   Procedure: Remove Hardware, Irrigation and Debridement Right Calcaneus;  Surgeon: [name], MD;  Location: [facility] OR;  Service: Orthopedics;  Laterality: Right;  . JOINT REPLACEMENT Right 2016   Hip Replacement  . ORIF CALCANEOUS FRACTURE Right 01/23/2017   Procedure: OPEN REDUCTION INTERNAL FIXATION (ORIF) RIGHT CALCANEOUS FRACTURE;  Surgeon: [name], MD;  Location: [facility] OR;  Service: Orthopedics;  Laterality: Right;   Social  History   Occupational History  . Not on file.   Social History Main Topics  . Smoking status: Current Every Day Smoker    Packs/day: 0.25    Years: 25.00    Types: Cigarettes  . Smokeless tobacco: Never Used  . Alcohol use No  . Drug use: No  . Sexual activity: Yes

## 2017-06-01 ENCOUNTER — Encounter (INDEPENDENT_AMBULATORY_CARE_PROVIDER_SITE_OTHER): Payer: Self-pay | Admitting: Orthopedic Surgery

## 2017-06-01 ENCOUNTER — Ambulatory Visit (INDEPENDENT_AMBULATORY_CARE_PROVIDER_SITE_OTHER): Payer: Medicaid Other | Admitting: Orthopedic Surgery

## 2017-06-01 DIAGNOSIS — T847XXS Infection and inflammatory reaction due to other internal orthopedic prosthetic devices, implants and grafts, sequela: Secondary | ICD-10-CM

## 2017-06-01 MED ORDER — DOXYCYCLINE HYCLATE 100 MG PO TABS: 100.0000 mg | ORAL_TABLET | Freq: Two times a day (BID) | ORAL | 0 refills | Status: DC

## 2017-06-01 NOTE — Progress Notes (Signed)
Office Visit Note   Patient: Charles Blankenship           Date of Birth: 06/13/1961           MRN: [id number] Visit Date: 06/01/2017              Requested by: Tylene FantasiaMuse, Rochelle D., PA-C [address] [place], [place] [address] PCP: Tylene FantasiaMuse, Rochelle D., PA-C  Chief Complaint  Patient presents with  . Right Foot - Follow-up      HPI: Patient is a 56 year old gentleman who presents in follow-up status post removal of deep retained hardware with infection from internal fixation of a calcaneal fracture.  Patient states it is a very small drop in drainage at this time.  Feels like the compression socks have assisted with wound healing.  Assessment & Plan: Visit Diagnoses:  1. Infected hardware in right lower extremity, sequela     Plan: We will start him on doxycycline.  He has a very small drop of purulent drainage there is no surrounding cellulitis.  Continue with the compression stockings continue with protected weightbearing.  Follow-Up Instructions: Return in about 2 weeks (around 06/15/2017).   Ortho Exam  Patient is alert, oriented, no adenopathy, well-dressed, normal affect, normal respiratory effort. Examination the wound has completely healed there is one area that is 3 mm in diameter 1 mm deep with a very small drop of purulent drainage.  This was expressed there is good granulation tissue at the base of the wound.  We will have the patient resume wearing the medical compression stocking with Dial soap cleansing daily prescription was called in for doxycycline.  Imaging: No results found. No images are attached to the encounter.  Labs: Lab Results  Component Value Date   LABURIC 8.5 (H) 03/19/2017   REPTSTATUS 03/25/2017 FINAL 03/20/2017   GRAMSTAIN NO WBC SEEN NO ORGANISMS SEEN  03/20/2017   CULT  03/20/2017    RARE STAPHYLOCOCCUS AUREUS CRITICAL RESULT CALLED TO, READ BACK BY AND VERIFIED WITH: DR Joette CatchingYATES,[facility] AT 1128 03/21/17 BY L BENFIELD CONCERNING  GROWTH ON CULTURE NO ANAEROBES ISOLATED    LABORGA STAPHYLOCOCCUS AUREUS 03/20/2017    Orders:  No orders of the defined types were placed in this encounter.  Meds ordered this encounter  Medications  . doxycycline (VIBRA-TABS) 100 MG tablet    Sig: Take 1 tablet (100 mg total) by mouth 2 (two) times daily.    Dispense:  60 tablet    Refill:  0     Procedures: No procedures performed  Clinical Data: No additional findings.  ROS:  All other systems negative, except as noted in the HPI. Review of Systems  Objective: Vital Signs: There were no vitals taken for this visit.  Specialty Comments:  No specialty comments available.  PMFS History: Patient Active Problem List   Diagnosis Date Noted  . Chronic idiopathic gout involving toe of right foot without tophus 03/19/2017  . Infected hardware in right leg (HCC) 03/19/2017  . Displaced fracture of body of right calcaneus, initial encounter for closed fracture    Past Medical History:  Diagnosis Date  . Arthritis   . Gout   . Hyperlipemia   . Hypertension     No family history on file.  Past Surgical History:  Procedure Laterality Date  . DG FOOT HEEL ([facility] HX) Right   . EYE SURGERY     pt had right eye removed  . HARDWARE REMOVAL Right 03/20/2017   Procedure:  Remove Hardware, Irrigation and Debridement Right Calcaneus;  Surgeon: [name], MD;  Location[facility] OR;  Service: Orthopedics;  Laterality: Right;  . JOINT REPLACEMENT Right 2016   Hip Replacement  . ORIF CALCANEOUS FRACTURE Right 01/23/2017   Procedure: OPEN REDUCTION INTERNAL FIXATION (ORIF) RIGHT CALCANEOUS FRACTURE;  Surgeon: [name], MD;  Location: [facility] OR;  Service: Orthopedics;  Laterality: Right;   Social History   Occupational History  . Not on file.   Social History Main Topics  . Smoking status: Current Every Day Smoker    Packs/day: 0.25    Years: 25.00    Types: Cigarettes  . Smokeless tobacco: Never Used  . Alcohol use No    . Drug use: No  . Sexual activity: Yes

## 2017-06-15 ENCOUNTER — Ambulatory Visit (INDEPENDENT_AMBULATORY_CARE_PROVIDER_SITE_OTHER): Payer: Medicaid Other | Admitting: Orthopedic Surgery

## 2017-06-15 ENCOUNTER — Encounter (INDEPENDENT_AMBULATORY_CARE_PROVIDER_SITE_OTHER): Payer: Self-pay | Admitting: Orthopedic Surgery

## 2017-06-15 VITALS — Ht 72.0 in | Wt 232.0 lb

## 2017-06-15 DIAGNOSIS — M86271 Subacute osteomyelitis, right ankle and foot: Secondary | ICD-10-CM | POA: Diagnosis not present

## 2017-06-15 MED ORDER — LEVOFLOXACIN 750 MG PO TABS: 750.0000 mg | ORAL_TABLET | Freq: Every day | ORAL | 0 refills | Status: DC

## 2017-06-15 NOTE — Progress Notes (Signed)
Office Visit Note   Patient: Charles Blankenship           Date of Birth: 06/20/1961           MRN: [id number] Visit Date: 06/15/2017              Requested by: Tylene FantasiaMuse, Rochelle D., PA-C [address] [place], [place] [address] PCP: Tylene FantasiaMuse, Rochelle D., PA-C  Chief Complaint  Patient presents with  . Right Foot - Routine Post Op    03/20/17 removal of hardware I&D right calcaneus       HPI: Patient presents in follow-up status post removal of deep retained hardware for infection secondary to internal fixation from his calcaneus fracture.  Patient states that the pain has resolved he states he is almost out of his doxycycline states that he started developing increased drainage that has just reoccurred.  Assessment & Plan: Visit Diagnoses:  1. Subacute osteomyelitis, right ankle and foot (HCC)     Plan: Due to the reoccurrence of purulent drainage we will plan for repeat irrigation and debridement of the calcaneus.  Plan for surgery on Friday.  Will start Levaquin 1 tablet a day this is started after culture will need to plan for a PICC line  postoperatively with 6 weeks of IV antibiotics with infectious disease consult after surgery.  Follow-Up Instructions: Return in about 1 week (around 06/22/2017).   Ortho Exam  Patient is alert, oriented, no adenopathy, well-dressed, normal affect, normal respiratory effort. Examination patient has no tenderness to palpation however with palpation around the wound there is purulent drainage the wound is about 5 mm in diameter and 3 mm deep.  A culture swab was obtained from within the wound.  There is no surrounding cellulitis.  Initial cultures were positive for staph.  Imaging: No results found. No images are attached to the encounter.  Labs: Lab Results  Component Value Date   LABURIC 8.5 (H) 03/19/2017   REPTSTATUS 03/25/2017 FINAL 03/20/2017   GRAMSTAIN NO WBC SEEN NO ORGANISMS SEEN  03/20/2017   CULT  03/20/2017    RARE  STAPHYLOCOCCUS AUREUS CRITICAL RESULT CALLED TO, READ BACK BY AND VERIFIED WITH: DR Joette CatchingYATES,[facility] AT 1128 03/21/17 BY L BENFIELD CONCERNING GROWTH ON CULTURE NO ANAEROBES ISOLATED    LABORGA STAPHYLOCOCCUS AUREUS 03/20/2017    Orders:  No orders of the defined types were placed in this encounter.  Meds ordered this encounter  Medications  . levofloxacin (LEVAQUIN) 750 MG tablet    Sig: Take 1 tablet (750 mg total) daily by mouth.    Dispense:  30 tablet    Refill:  0     Procedures: No procedures performed  Clinical Data: No additional findings.  ROS:  All other systems negative, except as noted in the HPI. Review of Systems  Objective: Vital Signs: Ht 6' (1.829 m)   Wt 232 lb (105.2 kg)   BMI 31.46 kg/m   Specialty Comments:  No specialty comments available.  PMFS History: Patient Active Problem List   Diagnosis Date Noted  . Subacute osteomyelitis, right ankle and foot (HCC) 06/15/2017  . Chronic idiopathic gout involving toe of right foot without tophus 03/19/2017  . Infected hardware in right leg (HCC) 03/19/2017  . Displaced fracture of body of right calcaneus, initial encounter for closed fracture    Past Medical History:  Diagnosis Date  . Arthritis   . Gout   . Hyperlipemia   . Hypertension     History  reviewed. No pertinent family history.  Past Surgical History:  Procedure Laterality Date  . DG FOOT HEEL ([facility] HX) Right   . EYE SURGERY     pt had right eye removed  . JOINT REPLACEMENT Right 2016   Hip Replacement   Social History   Occupational History  . Not on file  Tobacco Use  . Smoking status: Current Every Day Smoker    Packs/day: 0.25    Years: 25.00    Pack years: 6.25    Types: Cigarettes  . Smokeless tobacco: Never Used  Substance and Sexual Activity  . Alcohol use: No  . Drug use: No  . Sexual activity: Yes

## 2017-06-18 ENCOUNTER — Encounter (HOSPITAL_COMMUNITY): Payer: Self-pay | Admitting: *Deleted

## 2017-06-18 ENCOUNTER — Other Ambulatory Visit: Payer: Self-pay

## 2017-06-18 ENCOUNTER — Other Ambulatory Visit (INDEPENDENT_AMBULATORY_CARE_PROVIDER_SITE_OTHER): Payer: Self-pay | Admitting: Family

## 2017-06-18 LAB — WOUND CULTURE
MICRO NUMBER: 81271640
SPECIMEN QUALITY:: ADEQUATE

## 2017-06-18 MED ORDER — CEFAZOLIN SODIUM-DEXTROSE 2-4 GM/100ML-% IV SOLN
2.0000 g | INTRAVENOUS | Status: AC
  Administered 2017-06-19: 2 g via INTRAVENOUS
  Filled 2017-06-18: qty 100

## 2017-06-19 ENCOUNTER — Encounter (HOSPITAL_COMMUNITY): Payer: Self-pay | Admitting: *Deleted

## 2017-06-19 ENCOUNTER — Ambulatory Visit (HOSPITAL_COMMUNITY): Payer: Medicaid Other | Admitting: Certified Registered Nurse Anesthetist

## 2017-06-19 ENCOUNTER — Encounter (HOSPITAL_COMMUNITY)
Admission: RE | Disposition: A | Discharge status: Home Health | Payer: Self-pay | Source: Ambulatory Visit | Attending: Orthopedic Surgery

## 2017-06-19 ENCOUNTER — Inpatient Hospital Stay (HOSPITAL_COMMUNITY)
Admission: RE | Admit: 2017-06-19 | Discharge: 2017-06-22 | DRG: 505 | Disposition: A | Discharge status: Home Health | Payer: Medicaid Other | Source: Ambulatory Visit | Attending: Orthopedic Surgery | Admitting: Orthopedic Surgery

## 2017-06-19 DIAGNOSIS — Z9001 Acquired absence of eye: Secondary | ICD-10-CM | POA: Diagnosis not present

## 2017-06-19 DIAGNOSIS — M86171 Other acute osteomyelitis, right ankle and foot: Secondary | ICD-10-CM | POA: Diagnosis present

## 2017-06-19 DIAGNOSIS — Z6831 Body mass index (BMI) 31.0-31.9, adult: Secondary | ICD-10-CM | POA: Diagnosis not present

## 2017-06-19 DIAGNOSIS — B954 Other streptococcus as the cause of diseases classified elsewhere: Secondary | ICD-10-CM | POA: Diagnosis not present

## 2017-06-19 DIAGNOSIS — I10 Essential (primary) hypertension: Secondary | ICD-10-CM | POA: Diagnosis present

## 2017-06-19 DIAGNOSIS — M1000 Idiopathic gout, unspecified site: Secondary | ICD-10-CM | POA: Diagnosis present

## 2017-06-19 DIAGNOSIS — F4310 Post-traumatic stress disorder, unspecified: Secondary | ICD-10-CM | POA: Diagnosis present

## 2017-06-19 DIAGNOSIS — T8469XS Infection and inflammatory reaction due to internal fixation device of other site, sequela: Secondary | ICD-10-CM

## 2017-06-19 DIAGNOSIS — B952 Enterococcus as the cause of diseases classified elsewhere: Secondary | ICD-10-CM | POA: Diagnosis present

## 2017-06-19 DIAGNOSIS — E669 Obesity, unspecified: Secondary | ICD-10-CM | POA: Diagnosis present

## 2017-06-19 DIAGNOSIS — B9561 Methicillin susceptible Staphylococcus aureus infection as the cause of diseases classified elsewhere: Secondary | ICD-10-CM | POA: Diagnosis present

## 2017-06-19 DIAGNOSIS — Z96641 Presence of right artificial hip joint: Secondary | ICD-10-CM | POA: Diagnosis present

## 2017-06-19 DIAGNOSIS — G629 Polyneuropathy, unspecified: Secondary | ICD-10-CM | POA: Diagnosis present

## 2017-06-19 DIAGNOSIS — F17210 Nicotine dependence, cigarettes, uncomplicated: Secondary | ICD-10-CM | POA: Diagnosis present

## 2017-06-19 DIAGNOSIS — A4901 Methicillin susceptible Staphylococcus aureus infection, unspecified site: Secondary | ICD-10-CM | POA: Diagnosis not present

## 2017-06-19 DIAGNOSIS — F1021 Alcohol dependence, in remission: Secondary | ICD-10-CM | POA: Diagnosis present

## 2017-06-19 DIAGNOSIS — E785 Hyperlipidemia, unspecified: Secondary | ICD-10-CM | POA: Diagnosis present

## 2017-06-19 DIAGNOSIS — M79671 Pain in right foot: Secondary | ICD-10-CM | POA: Diagnosis present

## 2017-06-19 HISTORY — PX: IRRIGATION AND DEBRIDEMENT FOOT: SHX6602

## 2017-06-19 HISTORY — DX: Polyneuropathy, unspecified: G62.9

## 2017-06-19 HISTORY — DX: Personal history of other medical treatment: Z92.89

## 2017-06-19 HISTORY — DX: Post-traumatic stress disorder, unspecified: F43.10

## 2017-06-19 HISTORY — PX: I&D EXTREMITY: SHX5045

## 2017-06-19 LAB — BASIC METABOLIC PANEL
Anion gap: 7 (ref 5–15)
BUN: 9 mg/dL (ref 6–20)
CHLORIDE: 103 mmol/L (ref 101–111)
CO2: 23 mmol/L (ref 22–32)
CREATININE: 1.09 mg/dL (ref 0.61–1.24)
Calcium: 9.4 mg/dL (ref 8.9–10.3)
GFR calc Af Amer: >60 mL/min (ref >60)
GLUCOSE: 89 mg/dL (ref 65–99)
POTASSIUM: 3.7 mmol/L (ref 3.5–5.1)
SODIUM: 133 mmol/L — AB (ref 135–145)

## 2017-06-19 LAB — CBC
HEMATOCRIT: 31.1 % — AB (ref 39.0–52.0)
Hemoglobin: 10.7 g/dL — ABNORMAL LOW (ref 13.0–17.0)
MCH: 27.6 pg (ref 26.0–34.0)
MCHC: 34.4 g/dL (ref 30.0–36.0)
MCV: 80.2 fL (ref 78.0–100.0)
PLATELETS: 264 10*3/uL (ref 150–400)
RBC: 3.88 MIL/uL — ABNORMAL LOW (ref 4.22–5.81)
RDW: 16.3 % — AB (ref 11.5–15.5)
WBC: 8.5 10*3/uL (ref 4.0–10.5)

## 2017-06-19 SURGERY — IRRIGATION AND DEBRIDEMENT EXTREMITY
Anesthesia: General | Laterality: Right

## 2017-06-19 MED ORDER — MIDAZOLAM HCL 5 MG/5ML IJ SOLN
INTRAMUSCULAR | Status: DC | PRN
  Administered 2017-06-19: 2 mg via INTRAVENOUS

## 2017-06-19 MED ORDER — DIPHENHYDRAMINE HCL 25 MG PO CAPS
25.0000 mg | ORAL_CAPSULE | Freq: Two times a day (BID) | ORAL | Status: DC
  Administered 2017-06-19 – 2017-06-22 (×6): 25 mg via ORAL
  Filled 2017-06-19 (×6): qty 1

## 2017-06-19 MED ORDER — PRAVASTATIN SODIUM 20 MG PO TABS
20.0000 mg | ORAL_TABLET | Freq: Every day | ORAL | Status: DC
  Administered 2017-06-19 – 2017-06-21 (×3): 20 mg via ORAL
  Filled 2017-06-19 (×3): qty 1

## 2017-06-19 MED ORDER — PROMETHAZINE HCL 25 MG/ML IJ SOLN: 6.2500 mg | INTRAMUSCULAR | Status: DC | PRN

## 2017-06-19 MED ORDER — OXYCODONE HCL 5 MG/5ML PO SOLN: 5.0000 mg | Freq: Once | ORAL | Status: AC | PRN

## 2017-06-19 MED ORDER — PROPOFOL 10 MG/ML IV BOLUS
INTRAVENOUS | Status: DC | PRN
  Administered 2017-06-19: 200 mg via INTRAVENOUS

## 2017-06-19 MED ORDER — GABAPENTIN 100 MG PO CAPS
200.0000 mg | ORAL_CAPSULE | Freq: Every day | ORAL | Status: DC
  Administered 2017-06-19 – 2017-06-21 (×3): 200 mg via ORAL
  Filled 2017-06-19 (×3): qty 2

## 2017-06-19 MED ORDER — MAGNESIUM CITRATE PO SOLN
1.0000 | Freq: Once | ORAL | Status: DC | PRN
Start: 2017-06-19 — End: 2017-06-22

## 2017-06-19 MED ORDER — GENTAMICIN SULFATE 40 MG/ML IJ SOLN
INTRAMUSCULAR | Status: DC | PRN
  Administered 2017-06-19: 160 mg

## 2017-06-19 MED ORDER — HYDROMORPHONE HCL 1 MG/ML IJ SOLN
INTRAMUSCULAR | Status: AC
  Filled 2017-06-19: qty 1

## 2017-06-19 MED ORDER — FENTANYL CITRATE (PF) 250 MCG/5ML IJ SOLN
INTRAMUSCULAR | Status: AC
  Filled 2017-06-19: qty 5

## 2017-06-19 MED ORDER — BISACODYL 10 MG RE SUPP: 10.0000 mg | Freq: Every day | RECTAL | Status: DC | PRN

## 2017-06-19 MED ORDER — VANCOMYCIN HCL 500 MG IV SOLR
INTRAVENOUS | Status: DC | PRN
  Administered 2017-06-19: 500 mg

## 2017-06-19 MED ORDER — ACETAMINOPHEN 650 MG RE SUPP: 650.0000 mg | RECTAL | Status: DC | PRN

## 2017-06-19 MED ORDER — FENTANYL CITRATE (PF) 100 MCG/2ML IJ SOLN
INTRAMUSCULAR | Status: DC | PRN
  Administered 2017-06-19 (×5): 50 ug via INTRAVENOUS

## 2017-06-19 MED ORDER — PROPOFOL 10 MG/ML IV BOLUS
INTRAVENOUS | Status: AC
  Filled 2017-06-19: qty 20

## 2017-06-19 MED ORDER — LISINOPRIL-HYDROCHLOROTHIAZIDE 20-12.5 MG PO TABS: 1.0000 | ORAL_TABLET | Freq: Every day | ORAL | Status: DC

## 2017-06-19 MED ORDER — POLYETHYLENE GLYCOL 3350 17 G PO PACK: 17.0000 g | PACK | Freq: Every day | ORAL | Status: DC | PRN

## 2017-06-19 MED ORDER — ASPIRIN EC 81 MG PO TBEC
81.0000 mg | DELAYED_RELEASE_TABLET | Freq: Every day | ORAL | Status: DC
  Administered 2017-06-19 – 2017-06-22 (×4): 81 mg via ORAL
  Filled 2017-06-19 (×4): qty 1

## 2017-06-19 MED ORDER — DIPHENHYDRAMINE HCL 25 MG PO TABS
25.0000 mg | ORAL_TABLET | Freq: Two times a day (BID) | ORAL | Status: DC
  Filled 2017-06-19: qty 1

## 2017-06-19 MED ORDER — MIDAZOLAM HCL 2 MG/2ML IJ SOLN
INTRAMUSCULAR | Status: AC
  Filled 2017-06-19: qty 2

## 2017-06-19 MED ORDER — METHOCARBAMOL 500 MG PO TABS
500.0000 mg | ORAL_TABLET | Freq: Four times a day (QID) | ORAL | Status: DC | PRN
  Administered 2017-06-19 – 2017-06-22 (×6): 500 mg via ORAL
  Filled 2017-06-19 (×6): qty 1

## 2017-06-19 MED ORDER — DIPHENHYDRAMINE HCL 50 MG/ML IJ SOLN
25.0000 mg | Freq: Once | INTRAMUSCULAR | Status: AC
  Administered 2017-06-19: 25 mg via INTRAVENOUS

## 2017-06-19 MED ORDER — DOXEPIN HCL 25 MG PO CAPS
25.0000 mg | ORAL_CAPSULE | Freq: Every day | ORAL | Status: DC
  Administered 2017-06-19 – 2017-06-21 (×3): 25 mg via ORAL
  Filled 2017-06-19 (×4): qty 1

## 2017-06-19 MED ORDER — ALLOPURINOL 100 MG PO TABS
100.0000 mg | ORAL_TABLET | Freq: Two times a day (BID) | ORAL | Status: DC
  Administered 2017-06-19 – 2017-06-22 (×6): 100 mg via ORAL
  Filled 2017-06-19 (×6): qty 1

## 2017-06-19 MED ORDER — OXYCODONE HCL 5 MG PO TABS
5.0000 mg | ORAL_TABLET | ORAL | Status: DC | PRN
  Administered 2017-06-19 – 2017-06-20 (×4): 5 mg via ORAL
  Filled 2017-06-19 (×4): qty 1

## 2017-06-19 MED ORDER — ACETAMINOPHEN 325 MG PO TABS: 650.0000 mg | ORAL_TABLET | ORAL | Status: DC | PRN

## 2017-06-19 MED ORDER — VANCOMYCIN HCL IN DEXTROSE 1-5 GM/200ML-% IV SOLN
1000.0000 mg | Freq: Two times a day (BID) | INTRAVENOUS | Status: DC
  Administered 2017-06-20: 1000 mg via INTRAVENOUS
  Filled 2017-06-19: qty 200

## 2017-06-19 MED ORDER — LIDOCAINE 2% (20 MG/ML) 5 ML SYRINGE
INTRAMUSCULAR | Status: DC | PRN
  Administered 2017-06-19: 100 mg via INTRAVENOUS

## 2017-06-19 MED ORDER — DEXAMETHASONE SODIUM PHOSPHATE 10 MG/ML IJ SOLN
INTRAMUSCULAR | Status: DC | PRN
Start: 2017-06-19 — End: 2017-06-19
  Administered 2017-06-19: 5 mg via INTRAVENOUS

## 2017-06-19 MED ORDER — GENTAMICIN SULFATE 40 MG/ML IJ SOLN
INTRAMUSCULAR | Status: AC
  Filled 2017-06-19: qty 4

## 2017-06-19 MED ORDER — HYDROMORPHONE HCL 1 MG/ML IJ SOLN
1.0000 mg | INTRAMUSCULAR | Status: DC | PRN
Start: 2017-06-19 — End: 2017-06-22
  Administered 2017-06-19 – 2017-06-21 (×6): 1 mg via INTRAVENOUS
  Filled 2017-06-19 (×6): qty 1

## 2017-06-19 MED ORDER — OXYCODONE HCL 5 MG PO TABS
10.0000 mg | ORAL_TABLET | ORAL | Status: DC | PRN
  Administered 2017-06-19 – 2017-06-22 (×10): 10 mg via ORAL
  Filled 2017-06-19 (×10): qty 2

## 2017-06-19 MED ORDER — DIPHENHYDRAMINE HCL 50 MG/ML IJ SOLN
INTRAMUSCULAR | Status: AC
  Administered 2017-06-19: 25 mg via INTRAVENOUS
  Filled 2017-06-19: qty 1

## 2017-06-19 MED ORDER — MEPERIDINE HCL 25 MG/ML IJ SOLN: 6.2500 mg | INTRAMUSCULAR | Status: DC | PRN

## 2017-06-19 MED ORDER — HYDROMORPHONE HCL 1 MG/ML IJ SOLN
0.2500 mg | INTRAMUSCULAR | Status: DC | PRN
  Administered 2017-06-19 (×2): 0.5 mg via INTRAVENOUS

## 2017-06-19 MED ORDER — OXYCODONE HCL 5 MG PO TABS
ORAL_TABLET | ORAL | Status: AC
  Administered 2017-06-19: 5 mg via ORAL
  Filled 2017-06-19: qty 1

## 2017-06-19 MED ORDER — ONDANSETRON HCL 4 MG/2ML IJ SOLN
INTRAMUSCULAR | Status: DC | PRN
  Administered 2017-06-19: 4 mg via INTRAVENOUS

## 2017-06-19 MED ORDER — HYDROCHLOROTHIAZIDE 12.5 MG PO CAPS
12.5000 mg | ORAL_CAPSULE | Freq: Every day | ORAL | Status: DC
  Administered 2017-06-19 – 2017-06-22 (×4): 12.5 mg via ORAL
  Filled 2017-06-19 (×4): qty 1

## 2017-06-19 MED ORDER — ONDANSETRON HCL 4 MG/2ML IJ SOLN: 4.0000 mg | Freq: Four times a day (QID) | INTRAMUSCULAR | Status: DC | PRN

## 2017-06-19 MED ORDER — DOCUSATE SODIUM 100 MG PO CAPS
100.0000 mg | ORAL_CAPSULE | Freq: Two times a day (BID) | ORAL | Status: DC
  Administered 2017-06-19 – 2017-06-22 (×6): 100 mg via ORAL
  Filled 2017-06-19 (×6): qty 1

## 2017-06-19 MED ORDER — CHLORHEXIDINE GLUCONATE 4 % EX LIQD: 60.0000 mL | Freq: Once | CUTANEOUS | Status: DC

## 2017-06-19 MED ORDER — ONDANSETRON HCL 4 MG PO TABS: 4.0000 mg | ORAL_TABLET | Freq: Four times a day (QID) | ORAL | Status: DC | PRN

## 2017-06-19 MED ORDER — METHOCARBAMOL 1000 MG/10ML IJ SOLN: 500.0000 mg | Freq: Four times a day (QID) | INTRAVENOUS | Status: DC | PRN

## 2017-06-19 MED ORDER — METOCLOPRAMIDE HCL 5 MG/ML IJ SOLN: 5.0000 mg | Freq: Three times a day (TID) | INTRAMUSCULAR | Status: DC | PRN

## 2017-06-19 MED ORDER — OXYCODONE HCL 5 MG PO TABS
5.0000 mg | ORAL_TABLET | Freq: Once | ORAL | Status: AC | PRN
  Administered 2017-06-19: 5 mg via ORAL

## 2017-06-19 MED ORDER — PIPERACILLIN-TAZOBACTAM 3.375 G IVPB
3.3750 g | Freq: Three times a day (TID) | INTRAVENOUS | Status: DC
  Administered 2017-06-19 – 2017-06-20 (×3): 3.375 g via INTRAVENOUS
  Filled 2017-06-19 (×3): qty 50

## 2017-06-19 MED ORDER — SODIUM CHLORIDE 0.9 % IV SOLN
INTRAVENOUS | Status: DC
  Administered 2017-06-19: 18:00:00 via INTRAVENOUS

## 2017-06-19 MED ORDER — VANCOMYCIN HCL 10 G IV SOLR
2000.0000 mg | Freq: Once | INTRAVENOUS | Status: AC
  Administered 2017-06-19: 2000 mg via INTRAVENOUS
  Filled 2017-06-19: qty 2000

## 2017-06-19 MED ORDER — METOCLOPRAMIDE HCL 5 MG PO TABS: 5.0000 mg | ORAL_TABLET | Freq: Three times a day (TID) | ORAL | Status: DC | PRN

## 2017-06-19 MED ORDER — 0.9 % SODIUM CHLORIDE (POUR BTL) OPTIME
TOPICAL | Status: DC | PRN
  Administered 2017-06-19: 1000 mL

## 2017-06-19 MED ORDER — LACTATED RINGERS IV SOLN
INTRAVENOUS | Status: DC
  Administered 2017-06-19: 13:00:00 via INTRAVENOUS

## 2017-06-19 MED ORDER — LISINOPRIL 20 MG PO TABS
20.0000 mg | ORAL_TABLET | Freq: Every day | ORAL | Status: DC
  Administered 2017-06-19 – 2017-06-22 (×4): 20 mg via ORAL
  Filled 2017-06-19 (×4): qty 1

## 2017-06-19 MED ORDER — VANCOMYCIN HCL 500 MG IV SOLR
INTRAVENOUS | Status: AC
  Filled 2017-06-19: qty 500

## 2017-06-19 SURGICAL SUPPLY — 34 items
BLADE SURG 21 STRL SS (BLADE) ×3 IMPLANT
BNDG COHESIVE 6X5 TAN STRL LF (GAUZE/BANDAGES/DRESSINGS) IMPLANT
BNDG GAUZE ELAST 4 BULKY (GAUZE/BANDAGES/DRESSINGS) ×6 IMPLANT
CANISTER WOUND CARE 500ML ATS (WOUND CARE) ×3 IMPLANT
COVER SURGICAL LIGHT HANDLE (MISCELLANEOUS) ×6 IMPLANT
DRAPE U-SHAPE 47X51 STRL (DRAPES) ×3 IMPLANT
DRSG ADAPTIC 3X8 NADH LF (GAUZE/BANDAGES/DRESSINGS) ×3 IMPLANT
DURAPREP 26ML APPLICATOR (WOUND CARE) ×3 IMPLANT
ELECT REM PT RETURN 9FT ADLT (ELECTROSURGICAL)
ELECTRODE REM PT RTRN 9FT ADLT (ELECTROSURGICAL) IMPLANT
GAUZE SPONGE 4X4 12PLY STRL (GAUZE/BANDAGES/DRESSINGS) ×3 IMPLANT
GLOVE BIOGEL PI IND STRL 9 (GLOVE) ×1 IMPLANT
GLOVE BIOGEL PI INDICATOR 9 (GLOVE) ×2
GLOVE SURG ORTHO 9.0 STRL STRW (GLOVE) ×3 IMPLANT
GOWN STRL REUS W/ TWL XL LVL3 (GOWN DISPOSABLE) ×2 IMPLANT
GOWN STRL REUS W/TWL XL LVL3 (GOWN DISPOSABLE) ×4
HANDPIECE INTERPULSE COAX TIP (DISPOSABLE)
KIT BASIN OR (CUSTOM PROCEDURE TRAY) ×3 IMPLANT
KIT DRSG PREVENA PLUS 7DAY 125 (MISCELLANEOUS) ×6 IMPLANT
KIT ROOM TURNOVER OR (KITS) ×3 IMPLANT
KIT STIMULAN RAPID CURE 5CC (Orthopedic Implant) ×3 IMPLANT
MANIFOLD NEPTUNE II (INSTRUMENTS) ×3 IMPLANT
NS IRRIG 1000ML POUR BTL (IV SOLUTION) ×3 IMPLANT
PACK ORTHO EXTREMITY (CUSTOM PROCEDURE TRAY) ×3 IMPLANT
PAD ARMBOARD 7.5X6 YLW CONV (MISCELLANEOUS) ×6 IMPLANT
SET HNDPC FAN SPRY TIP SCT (DISPOSABLE) IMPLANT
STOCKINETTE IMPERVIOUS 9X36 MD (GAUZE/BANDAGES/DRESSINGS) IMPLANT
SUT ETHILON 2 0 PSLX (SUTURE) ×6 IMPLANT
SWAB COLLECTION DEVICE MRSA (MISCELLANEOUS) ×3 IMPLANT
SWAB CULTURE ESWAB REG 1ML (MISCELLANEOUS) IMPLANT
TOWEL OR 17X26 10 PK STRL BLUE (TOWEL DISPOSABLE) ×3 IMPLANT
TUBE CONNECTING 12'X1/4 (SUCTIONS) ×1
TUBE CONNECTING 12X1/4 (SUCTIONS) ×2 IMPLANT
YANKAUER SUCT BULB TIP NO VENT (SUCTIONS) ×3 IMPLANT

## 2017-06-19 NOTE — H&P (Signed)
Charles Blankenship is an 56 y.o. male.   Chief Complaint: Drainage and ulceration from the right inferior aspect of the calcaneal incision HPI: Patient presents in follow-up status post removal of deep retained hardware for infection secondary to internal fixation from his calcaneus fracture.  Patient states that the pain has resolved he states he is almost out of his doxycycline states that he started developing increased drainage that has just reoccurred.    Past Medical History:  Diagnosis Date  . Arthritis    was inhip- had hip replacement  . Gout   . Hyperlipemia   . Hypertension   . Neuropathy   . PTSD (post-traumatic stress disorder)     Past Surgical History:  Procedure Laterality Date  . DG FOOT HEEL ([facility] HX) Right   . EYE SURGERY     pt had right eye removed  . HARDWARE REMOVAL Right 03/20/2017   Procedure: Remove Hardware, Irrigation and Debridement Right Calcaneus;  Surgeon: Nadara Mustarduda, Leonia Heatherly V, MD;  [facility];  Service: Orthopedics;  Laterality: Right;  . JOINT REPLACEMENT Right 2016   Hip Replacement  . ORIF CALCANEOUS FRACTURE Right 01/23/2017   Procedure: OPEN REDUCTION INTERNAL FIXATION (ORIF) RIGHT CALCANEOUS FRACTURE;  Surgeon: Nadara Mustarduda, Brittnay Pigman V, MD;  [facility];  Service: Orthopedics;  Laterality: Right;    History reviewed. No pertinent family history. Social History:  reports that he has been smoking cigarettes.  He has a 6.25 pack-year smoking history. he has never used smokeless tobacco. He reports that he does not drink alcohol or use drugs.  Allergies: No Known Allergies  No medications prior to admission.    No results found for this or any previous visit (from the past 48 hour(s)). No results found.  Review of Systems  All other systems reviewed and are negative.   There were no vitals taken for this visit. Physical Exam  Patient is alert, oriented, no adenopathy, well-dressed, normal affect, normal respiratory effort. Examination patient  has no tenderness to palpation however with palpation around the wound there is purulent drainage the wound is about 5 mm in diameter and 3 mm deep.  A culture swab was obtained from within the wound.  There is no surrounding cellulitis.  Initial cultures were positive for staph.   Assessment/Plan 1. Subacute osteomyelitis, right ankle and foot (HCC)     Plan: Due to the reoccurrence of purulent drainage we will plan for repeat irrigation and debridement of the calcaneus.  Plan for surgery on Friday.  Will start Levaquin 1 tablet a day this is started after culture will need to plan for a PICC line  postoperatively with 6 weeks of IV antibiotics with infectious disease consult after surgery.     Nadara MustardMarcus V Waverly Chavarria, MD 06/19/2017, 6:36 AM

## 2017-06-19 NOTE — Progress Notes (Signed)
Pharmacy Antibiotic Note  Charles OchoaRicky L Blankenship is a 56 y.o. male admitted on 06/19/2017 with right calcaneus osteomyelitis s/p partial excision and placement of antibiotic beads with wound closure this PM. Patient was treated with doxycycline out patient but developed increased drainage.  Pharmacy has been consulted for Vancomycin and Zosyn dosing. Plan is for PICC line placement with 6 weeks of IV antibiotics. Patient received Ancef 2g pre-operatively at 14;23 PM.  Right foot tissue culture from 03/20/17 was MSSA which was pan sensitive.  Right foot wound culture from 06/15/17 in clinic grew Group G Streptococcus. Repeat culture was drawn today in OR and remains pending.    Patient is currently afebrile and wbc is within normal limits.  Current SCr of 1.09 is stable based on prior labs in system.   Plan: Zosyn 3.375g IV every 8 hours - 4 hour extended infusion.  Vancomycin 2 grams IV x1, then 1 gram IV every 12 hours. Monitor renal function, clinical status, and culture results Follow-up for ability to narrow to one agent.   Height: 6' (182.9 cm) Weight: 230 lb (104.3 kg) IBW/kg (Calculated) : 77.6  Temp (24hrs), Avg:97.8 F (36.6 C), Min:97.2 F (36.2 C), Max:98.4 F (36.9 C)  Recent Labs  Lab 06/19/17 1228  WBC 8.5  CREATININE 1.09    Estimated Creatinine Clearance: 94.5 mL/min (by C-G formula based on SCr of 1.09 mg/dL).    No Known Allergies  Antimicrobials this admission: Vancomycin 11/16 >> Zosyn 11/16 >>  Dose adjustments this admission:   Microbiology results: 03/20/17 R-foot wound: MSSA (pan sensitive) 06/15/17 R-foot wound: Group G Streptococcus 06/19/17 R-foot wound: >>  Thank you for allowing pharmacy to be a part of this patient's care.  Link SnufferJessica Inger Wiest, PharmD, BCPS, BCCCP Clinical Pharmacist Clinical phone 06/19/2017 until 11PM [phone]- #[phone] After hours, please call #[phone] 06/19/2017 5:12 PM

## 2017-06-19 NOTE — Transfer of Care (Signed)
Immediate Anesthesia Transfer of Care Note  Patient: Charles OchoaRicky L Klonowski  Procedure(s) Performed: DEBRIDEMENT CALCANEUS RIGHT FOOT, PLACE ANTIBIOTIC BEADS (Right )  Patient Location: PACU  Anesthesia Type:General  Level of Consciousness: awake, alert , oriented and patient cooperative  Airway & Oxygen Therapy: Patient Spontanous Breathing  Post-op Assessment: Report given to RN and Post -op Vital signs reviewed and stable  Post vital signs: Reviewed and stable  Last Vitals:  Vitals:   06/19/17 1237 06/19/17 1503  BP: 123/83 (!) 141/99  Pulse:  (!) 106  Resp: (!) 109 16  Temp: 36.9 C (!) 36.2 C  SpO2: (!) 20% 97%    Last Pain:  Vitals:   06/19/17 1237  TempSrc: Oral         Complications: No apparent anesthesia complications

## 2017-06-19 NOTE — Anesthesia Procedure Notes (Signed)
Procedure Name: LMA Insertion Date/Time: 06/19/2017 2:21 PM Performed by: White, Cordella RegisterKelsey Tena Jehan Bonano, CRNA Pre-anesthesia Checklist: Patient identified, Emergency Drugs available, Suction available and Patient being monitored Patient Re-evaluated:Patient Re-evaluated prior to induction Oxygen Delivery Method: Circle System Utilized Preoxygenation: Pre-oxygenation with 100% oxygen Induction Type: IV induction Ventilation: Mask ventilation without difficulty LMA: LMA inserted LMA Size: 5.0 Number of attempts: 1 Placement Confirmation: positive ETCO2 Tube secured with: Tape Dental Injury: Teeth and Oropharynx as per pre-operative assessment

## 2017-06-19 NOTE — Progress Notes (Signed)
Orthopedic Tech Progress Note Patient Details:  Charles Blankenship 09/05/1960 [id number]  Ortho Devices Type of Ortho Device: CAM walker Ortho Device/Splint Location: rle Ortho Device/Splint Interventions: Application   [name] 06/19/2017, 4:16 PM

## 2017-06-19 NOTE — Op Note (Signed)
06/19/2017  2:59 PM  PATIENT:  Jameire L Caudle    PRE-OPERATIVE DIAGNOSIS:  Osteomyelitis Right Calcaneus  POST-OPERATIVE DIAGNOSIS:  Same  PROCEDURE: Partial excision CALCANEUS RIGHT FOOT,  PLACE ANTIBIOTIC BEADS, Local tissue rearrangement for wound closure 2 x 5 cm, Application Praveena wound VAC.  SURGEON:  Nadara MustardMarcus V Jayln Branscom, MD  PHYSICIAN ASSISTANT:None ANESTHESIA:   General  PREOPERATIVE INDICATIONS:  Heloise OchoaRicky L Tassin is a  56 y.o. male with a diagnosis of Osteomyelitis Right Calcaneus who failed conservative measures and elected for surgical management.    The risks benefits and alternatives were discussed with the patient preoperatively including but not limited to the risks of infection, bleeding, nerve injury, cardiopulmonary complications, the need for revision surgery, among others, and the patient was willing to proceed.  OPERATIVE IMPLANTS: Stimulant antibiotic beads 5 cc with 500 mg of vancomycin and 160 mg gentamicin  OPERATIVE FINDINGS: Necrotic soft tissue sent for tissue cultures.  Cultures obtained from the office several days ago were positive for group G strep  OPERATIVE PROCEDURE: Patient was brought the operating room and underwent a general anesthetic.  After adequate levels of anesthesia were obtained patient's placed supine on the operating room table the right lower extremity was prepped using DuraPrep draped into a sterile field a timeout was called.  Patient's previous incision was used and this was ellipsed out around the ulcerative tissue.  This was resected in one block of tissue.  The ulcerative tissue as well as bone was sent for tissue cultures.  This was carried down to the calcaneus.  A rondure was used to remove nonviable calcaneus bone from the previous fracture.  There was no purulent abscess.  The wound was irrigated with normal saline there was good bleeding cancellus bone of the calcaneus.  No evidence of any deep abscess within the bone.   Antibiotic beads 5 cc were placed within the wound bed.  Local tissue rearrangement was used for wound closure 2 x 5 cm.  A Praveena wound VAC was applied this had a good suction fit patient was extubated taken to the PACU in stable condition.

## 2017-06-19 NOTE — Anesthesia Preprocedure Evaluation (Signed)
Anesthesia Evaluation  Patient identified by MRN, date of birth, ID band Patient awake    Reviewed: Allergy & Precautions, NPO status , Patient's Chart, lab work & pertinent test results  Airway Mallampati: II  TM Distance: >3 FB Neck ROM: Full    Dental  (+) Dental Advisory Given, Partial Upper   Pulmonary Current Smoker,    breath sounds clear to auscultation       Cardiovascular hypertension, Pt. on medications  Rhythm:Regular Rate:Normal     Neuro/Psych negative neurological ROS  negative psych ROS   GI/Hepatic negative GI ROS, Neg liver ROS,   Endo/Other  Obesity   Renal/GU negative Renal ROS     Musculoskeletal  (+) Arthritis ,   Abdominal   Peds  Hematology negative hematology ROS (+)   Anesthesia Other Findings   Reproductive/Obstetrics                             Anesthesia Physical  Anesthesia Plan  ASA: II  Anesthesia Plan: General   Post-op Pain Management:    Induction: Intravenous  PONV Risk Score and Plan: 1 and Ondansetron and Midazolam  Airway Management Planned: LMA  Additional Equipment:   Intra-op Plan:   Post-operative Plan: Extubation in OR  Informed Consent: I have reviewed the patients History and Physical, chart, labs and discussed the procedure including the risks, benefits and alternatives for the proposed anesthesia with the patient or authorized representative who has indicated his/her understanding and acceptance.   Dental advisory given  Plan Discussed with: CRNA  Anesthesia Plan Comments:         Anesthesia Quick Evaluation

## 2017-06-19 NOTE — Anesthesia Postprocedure Evaluation (Signed)
Anesthesia Post Note  Patient: Charles Blankenship  Procedure(s) Performed: DEBRIDEMENT CALCANEUS RIGHT FOOT, PLACE ANTIBIOTIC BEADS (Right )     Patient location during evaluation: PACU Anesthesia Type: General Level of consciousness: awake and alert Pain management: pain level controlled Vital Signs Assessment: post-procedure vital signs reviewed and stable Respiratory status: spontaneous breathing, nonlabored ventilation and respiratory function stable Cardiovascular status: blood pressure returned to baseline and stable Postop Assessment: no apparent nausea or vomiting Anesthetic complications: no    Last Vitals:  Vitals:   06/19/17 1545 06/19/17 1615  BP: 139/90 (!) 149/97  Pulse: (!) 113 (!) 102  Resp: 16 16  Temp:    SpO2: 96% 96%    Last Pain:  Vitals:   06/19/17 1237  TempSrc: Oral                 Lowella CurbWarren Ray Miller

## 2017-06-20 DIAGNOSIS — M86171 Other acute osteomyelitis, right ankle and foot: Principal | ICD-10-CM

## 2017-06-20 DIAGNOSIS — F1021 Alcohol dependence, in remission: Secondary | ICD-10-CM

## 2017-06-20 DIAGNOSIS — B954 Other streptococcus as the cause of diseases classified elsewhere: Secondary | ICD-10-CM

## 2017-06-20 DIAGNOSIS — A4901 Methicillin susceptible Staphylococcus aureus infection, unspecified site: Secondary | ICD-10-CM

## 2017-06-20 MED ORDER — CEFAZOLIN SODIUM-DEXTROSE 2-4 GM/100ML-% IV SOLN
2.0000 g | Freq: Three times a day (TID) | INTRAVENOUS | Status: DC
  Administered 2017-06-20 – 2017-06-22 (×7): 2 g via INTRAVENOUS
  Filled 2017-06-20 (×8): qty 100

## 2017-06-20 NOTE — Evaluation (Signed)
Physical Therapy Evaluation Patient Details Name: Charles Blankenship MRN: [id number] DOB: 12/31/1960 Today's Date: 06/20/2017   History of Present Illness  56 y.o. male with a diagnosis of Osteomyelitis Right Calcaneus who failed conservative measures and elected for surgical management. He underwent partial excision right calcaneus, antibiotic bead placement and wound vac placement.   Clinical Impression  Pt admitted with above diagnosis. Pt currently with functional limitations due to the deficits listed below (see PT Problem List). On eval, supervision provided for transfers and ambulation 200 feet with RW. Pt able to maintain TDWB RLE. Pt reports he prefers ambulating with crutches. He has crutches and RW at home. Pt will benefit from skilled PT to increase their independence and safety with mobility to allow discharge to the venue listed below.  PT to follow acutely. No follow up services indicated.     Follow Up Recommendations No PT follow up;Supervision - Intermittent    Equipment Recommendations  None recommended by PT    Recommendations for Other Services       Precautions / Restrictions Precautions Precautions: None Required Braces or Orthoses: Other Brace/Splint Other Brace/Splint: CAM boot Restrictions RLE Weight Bearing: Touchdown weight bearing      Mobility  Bed Mobility Overal bed mobility: Independent                Transfers Overall transfer level: Needs assistance   Transfers: Sit to/from Stand;Stand Pivot Transfers Sit to Stand: Supervision Stand pivot transfers: Supervision       General transfer comment: supervision due to lines  Ambulation/Gait Ambulation/Gait assistance: Supervision Ambulation Distance (Feet): 200 Feet Assistive device: Rolling walker (2 wheeled) Gait Pattern/deviations: Step-to pattern;Decreased stride length;Antalgic Gait velocity: decreased Gait velocity interpretation: Below normal speed for age/gender General  Gait Details: Pt able to maintain TDWB RLE.  Stairs            Wheelchair Mobility    Modified Rankin (Stroke Patients Only)       Balance Overall balance assessment: No apparent balance deficits (not formally assessed)                                           Pertinent Vitals/Pain Pain Assessment: Faces Faces Pain Scale: Hurts even more Pain Location: R heel with mobility Pain Descriptors / Indicators: Sore;Aching Pain Intervention(s): Monitored during session;Repositioned    Home Living Family/patient expects to be discharged to:: Private residence Living Arrangements: Spouse/significant other Available Help at Discharge: Family;Available 24 hours/day Type of Home: House Home Access: Level entry     Home Layout: One level Home Equipment: Walker - 2 wheels;Cane - single point;Crutches;Bedside commode;Shower seat      Prior Function Level of Independence: Independent with assistive device(s)         Comments: cane used for ambulation     Hand Dominance        Extremity/Trunk Assessment                Communication   Communication: No difficulties  Cognition Arousal/Alertness: Awake/alert Behavior During Therapy: WFL for tasks assessed/performed Overall Cognitive Status: Within Functional Limits for tasks assessed                                        General Comments      Exercises  Assessment/Plan    PT Assessment Patient needs continued PT services  PT Problem List Decreased activity tolerance;Pain;Decreased mobility       PT Treatment Interventions DME instruction;Gait training;Functional mobility training;Therapeutic activities;Patient/family education    PT Goals (Current goals can be found in the Care Plan section)  Acute Rehab PT Goals Patient Stated Goal: home PT Goal Formulation: With patient Time For Goal Achievement: 06/27/17 Potential to Achieve Goals: Good    Frequency Min  3X/week   Barriers to discharge        Co-evaluation               AM-PAC PT "6 Clicks" Daily Activity  Outcome Measure Difficulty turning over in bed (including adjusting bedclothes, sheets and blankets)?: None Difficulty moving from lying on back to sitting on the side of the bed? : None Difficulty sitting down on and standing up from a chair with arms (e.g., wheelchair, bedside commode, etc,.)?: None Help needed moving to and from a bed to chair (including a wheelchair)?: None Help needed walking in hospital room?: A Little Help needed climbing 3-5 steps with a railing? : A Little 6 Click Score: 22    End of Session Equipment Utilized During Treatment: Gait belt Activity Tolerance: Patient tolerated treatment well Patient left: in bed;with call bell/phone within reach;with family/visitor present Nurse Communication: Mobility status PT Visit Diagnosis: Difficulty in walking, not elsewhere classified (R26.2);Pain Pain - Right/Left: Right Pain - part of body: Ankle and joints of foot    Time: [id number] PT Time Calculation (min) (ACUTE ONLY): 28 min   Charges:   PT Evaluation $PT Eval Low Complexity: 1 Low PT Treatments $Gait Training: 8-22 mins   PT G Codes:        Aida RaiderWendy Abbeygail Igoe, PT  Office # [phone] Pager [phone]   Ilda FoilGarrow, Silvano Garofano Rene 06/20/2017, 2:48 PM
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
## 2017-06-20 NOTE — Progress Notes (Addendum)
Pharmacy Antibiotic Note  Charles Blankenship is a 56 y.o. male admitted on 06/19/2017 with right calcaneus osteomyelitis s/p partial excision and placement of antibiotic beads with wound closure yesterday evening. Patient was treated with doxycycline out patient but developed increased drainage. Currently on IV Zosyn and vancomycin with plan to switch to IV cefazolin today. Plan is for PICC line placement with 6 weeks of IV antibiotics.   Patient is currently afebrile and wbc is within normal limits.  Current SCr of 1.09 is stable based on prior labs in system.   Plan: D/c Vancomycin and Zosyn  Start cefazolin 2 gm IV Q 8 hours. Consider end-date of 12/28 for 6 week course  Monitor renal function, clinical status, and culture results  Pharmacy to sign off since no further dosage adjustments necessary   Height: 6' (182.9 cm) Weight: 230 lb (104.3 kg) IBW/kg (Calculated) : 77.6  Temp (24hrs), Avg:98 F (36.7 C), Min:97.2 F (36.2 C), Max:98.4 F (36.9 C)  Recent Labs  Lab 06/19/17 1228  WBC 8.5  CREATININE 1.09    Estimated Creatinine Clearance: 94.5 mL/min (by C-G formula based on SCr of 1.09 mg/dL).    No Known Allergies  Antimicrobials this admission: Vancomycin 11/16 >>11/17 Zosyn 11/16 >>11/17 Cefazolin 11/17>>   Dose adjustments this admission:   Microbiology results: 03/20/17 R-foot wound: MSSA (pan sensitive) 06/15/17 R-foot wound: Group G Streptococcus 06/19/17 R-foot wound: >>  Thank you for allowing pharmacy to be a part of this patient's care.  Vinnie LevelBenjamin Jeffre Enriques, PharmD., BCPS Clinical Pharmacist Pager [phone]

## 2017-06-20 NOTE — Progress Notes (Signed)
[facility]  [facility] will follow pt with ID team to support possible long term IV ABX if needed at DC to home.  If patient discharges after hours, please call [phone].   Sedalia Mutaamela S Chandler 06/20/2017, 7:58 AM

## 2017-06-20 NOTE — Progress Notes (Signed)
Subjective: Patient stable.  Wound VAC in place right foot   Objective: Vital signs in last 24 hours: Temp:  [97.2 F (36.2 C)-98.4 F (36.9 C)] 98.4 F (36.9 C) (11/17 0500) Pulse Rate:  [90-120] 94 (11/17 0500) Resp:  [16-109] 17 (11/17 0500) BP: (123-149)/(74-99) 131/88 (11/17 0901) SpO2:  [20 %-100 %] 100 % (11/17 0500) Weight:  [230 lb (104.3 kg)] 230 lb (104.3 kg) (11/16 1229)  Intake/Output from previous day: 11/16 0701 - 11/17 0700 In: 903 [I.V.:803; IV Piggyback:100] Out: 1700 [Urine:1680; Blood:20] Intake/Output this shift: No intake/output data recorded.  Exam:  No cellulitis present  Labs: Recent Labs    06/19/17 1228  HGB 10.7*   Recent Labs    06/19/17 1228  WBC 8.5  RBC 3.88*  HCT 31.1*  PLT 264   Recent Labs    06/19/17 1228  NA 133*  K 3.7  CL 103  CO2 23  BUN 9  CREATININE 1.09  GLUCOSE 89  CALCIUM 9.4   No results for input(s): LABPT, INR in the last 72 hours.  Assessment/Plan: Plan to continue wound VAC over the weekend and we will see how this heals   G Dorene GrebeScott Dean 06/20/2017, 9:17 AM

## 2017-06-20 NOTE — Progress Notes (Signed)
      INFECTIOUS DISEASE ATTENDING ADDENDUM:   Date: 06/20/2017  Patient name: Charles Blankenship  Medical record number: [id number]  Date of birth: 05/16/1961    Diagnosis: Calcaneal osteomyelitis  Culture Result: MSSA from August culture, Group G streptococcus from November office culture  No Known Allergies  OPAT Orders Discharge antibiotics:Cefazolin   Duration: 6 weeks End Date: July 30 2017  [facility] Care Per Protocol:  Labs weekly while on IV antibiotics: _x_ CBC with differential _x_ CMP _x_ CRP x__ ESR   _x_ Please pull PIC at completion of IV antibiotics __ Please leave PIC in place until doctor has seen patient or been notified  Fax weekly labs to [phone]  Clinic Follow Up Appt:  Next 2-3 weeks   Rhina Brackett Dam 06/20/2017, 10:23 AM

## 2017-06-20 NOTE — Progress Notes (Signed)
Spoke to RN re PICC placement possibly not today- no discharge orders in.  Per Dr August Saucerean note, will be monitored this weekend.  Will call prior to PICC placement.

## 2017-06-21 MED ORDER — SODIUM CHLORIDE 0.9% FLUSH
10.0000 mL | INTRAVENOUS | Status: DC | PRN
  Administered 2017-06-22: 10 mL
  Filled 2017-06-21: qty 40

## 2017-06-21 NOTE — Progress Notes (Signed)
Subjective: Patient stable.  Wound VAC functional for foot.  Patient may receive a PICC line tomorrow   Objective: Vital signs in last 24 hours: Temp:  [97.5 F (36.4 C)-98.1 F (36.7 C)] 97.5 F (36.4 C) (11/18 0433) Pulse Rate:  [83-100] 83 (11/18 0433) Resp:  [17-18] 18 (11/18 0433) BP: (109-123)/(54-87) 109/87 (11/18 0433) SpO2:  [97 %-99 %] 97 % (11/18 0433)  Intake/Output from previous day: 11/17 0701 - 11/18 0700 In: 450 [P.O.:240; I.V.:110; IV Piggyback:100] Out: 5475 [Urine:5475] Intake/Output this shift: Total I/O In: 200 [IV Piggyback:200] Out: -   Exam:  No cellulitis present  Labs: Recent Labs    06/19/17 1228  HGB 10.7*   Recent Labs    06/19/17 1228  WBC 8.5  RBC 3.88*  HCT 31.1*  PLT 264   Recent Labs    06/19/17 1228  NA 133*  K 3.7  CL 103  CO2 23  BUN 9  CREATININE 1.09  GLUCOSE 89  CALCIUM 9.4   No results for input(s): LABPT, INR in the last 72 hours.  Assessment/Plan: Plan for possible PICC line tomorrow.  Discharge per Dr. Eleanora NeighborXu   G Scott Rheba Diamond 06/21/2017, 10:04 AM

## 2017-06-21 NOTE — Progress Notes (Signed)
Peripherally Inserted Central Catheter/Midline Placement  The IV Nurse has discussed with the patient and/or persons authorized to consent for the patient, the purpose of this procedure and the potential benefits and risks involved with this procedure.  The benefits include less needle sticks, lab draws from the catheter, and the patient may be discharged home with the catheter. Risks include, but not limited to, infection, bleeding, blood clot (thrombus formation), and puncture of an artery; nerve damage and irregular heartbeat and possibility to perform a PICC exchange if needed/ordered by physician.  Alternatives to this procedure were also discussed.  Bard Power PICC patient education guide, fact sheet on infection prevention and patient information card has been provided to patient /or left at bedside.    PICC/Midline Placement Documentation  PICC Single Lumen 06/21/17 PICC Right Basilic 43 cm 0 cm (Active)  Indication for Insertion or Continuance of Line Home intravenous therapies (PICC only) 06/21/2017  6:47 PM  Exposed Catheter (cm) 0 cm 06/21/2017  6:47 PM  Site Assessment Clean;Dry;Intact 06/21/2017  6:47 PM  Line Status Flushed;Saline locked;Blood return noted 06/21/2017  6:47 PM  Dressing Type Transparent 06/21/2017  6:47 PM  Dressing Status Clean;Dry;Intact;Antimicrobial disc in place 06/21/2017  6:47 PM  Line Care Connections checked and tightened 06/21/2017  6:47 PM  Line Adjustment (NICU/IV Team Only) No 06/21/2017  6:47 PM  Dressing Intervention New dressing 06/21/2017  6:47 PM  Dressing Change Due 06/28/17 06/21/2017  6:47 PM       Elliot Dallyiggs, Reaghan Kawa Wright 06/21/2017, 6:49 PM

## 2017-06-21 NOTE — Progress Notes (Signed)
AT bedside to place PICC.  Risks and benefits explained to pt.  Pt very anxious.  "I know I have to have the PICC, but this is scaring me" "I'll go ahead and sign the paper, but I don't want it right now.  I have to get my prayer on.  God is healing me and I know that, but I have to pray about this".  Notified that it may have to be placed tomorrow am if unable to return this shift.  Pt and Huntley DecSara RN aware.

## 2017-06-21 NOTE — Progress Notes (Signed)
Able to place PICC at this time.  Pt requests to wait until he finishes eating.  Agrees upon PICC team arrival at 1730.

## 2017-06-21 NOTE — Progress Notes (Signed)
Patient ID: Charles Blankenship, male   DOB: 12/21/1960, 56 y.o.   MRN: [id number] Postoperative day 2 debridement osteomyelitis right calcaneus.  Patient does have drainage in the wound VAC canister.  Thank you for infectious disease consultation.  We will plan for 6 weeks IV Kefzol.  [facility] possibly PICC line placement tomorrow and discharge tomorrow.

## 2017-06-21 NOTE — Progress Notes (Signed)
Spoke with RN re PICC placement this am.  Pt aware.

## 2017-06-22 ENCOUNTER — Encounter (HOSPITAL_COMMUNITY): Payer: Self-pay | Admitting: Orthopedic Surgery

## 2017-06-22 MED ORDER — OXYCODONE-ACETAMINOPHEN 5-325 MG PO TABS: 1.0000 | ORAL_TABLET | ORAL | 0 refills | Status: DC | PRN

## 2017-06-22 MED ORDER — HEPARIN SOD (PORK) LOCK FLUSH 100 UNIT/ML IV SOLN
250.0000 [IU] | INTRAVENOUS | Status: AC | PRN
  Administered 2017-06-22: 250 [IU]

## 2017-06-22 MED ORDER — CEFAZOLIN IV (FOR PTA / DISCHARGE USE ONLY): 2.0000 g | Freq: Three times a day (TID) | INTRAVENOUS | 0 refills | Status: AC

## 2017-06-22 MED ORDER — HEPARIN SOD (PORK) LOCK FLUSH 100 UNIT/ML IV SOLN: 250.0000 [IU] | INTRAVENOUS | Status: DC | PRN

## 2017-06-22 NOTE — Progress Notes (Signed)
[facility]  [facility].  [facility] will provide IV ABX at DC with planned first home dose tonight at 10 PM and [facility] at 8-9 AM tomorrow.  [facility] will teach with patient's wife to support independence at home   If patient discharges after hours, please call [phone].   Charles Blankenship 06/22/2017, 9:44 AM

## 2017-06-22 NOTE — Discharge Summary (Signed)
Discharge Diagnoses:  Active Problems:   Acute osteomyelitis of right calcaneus (HCC)   MSSA (methicillin susceptible Staphylococcus aureus) infection   Group G streptococcal infection   Alcoholism in recovery [facility])   Surgeries: Procedure(s): DEBRIDEMENT CALCANEUS RIGHT FOOT, PLACE ANTIBIOTIC BEADS on 06/19/2017    Consultants:   Discharged Condition: Improved  Hospital Course: Charles Blankenship is an 56 y.o. male who was admitted 06/19/2017 with a chief complaint of osteomyelitis right calcaneous, with a final diagnosis of Osteomyelitis Right Calcaneus.  Patient was brought to the operating room on 06/19/2017 and underwent Procedure(s): DEBRIDEMENT CALCANEUS RIGHT FOOT, PLACE ANTIBIOTIC BEADS.    Patient was given perioperative antibiotics:  Anti-infectives (From admission, onward)   Start     Dose/Rate Route Frequency Ordered Stop   06/22/17 0000  ceFAZolin (ANCEF) IVPB     2 g Intravenous Every 8 hours 06/22/17 0628 08/02/17 2359   06/20/17 1400  ceFAZolin (ANCEF) IVPB 2g/100 mL premix     2 g 200 mL/hr over 30 Minutes Intravenous Every 8 hours 06/20/17 0912     06/20/17 0530  vancomycin (VANCOCIN) IVPB 1000 mg/200 mL premix  Status:  Discontinued     1,000 mg 200 mL/hr over 60 Minutes Intravenous Every 12 hours 06/19/17 1726 06/20/17 0912   06/19/17 1800  piperacillin-tazobactam (ZOSYN) IVPB 3.375 g  Status:  Discontinued     3.375 g 12.5 mL/hr over 240 Minutes Intravenous Every 8 hours 06/19/17 1726 06/20/17 0912   06/19/17 1730  vancomycin (VANCOCIN) 2,000 mg in sodium chloride 0.9 % 500 mL IVPB     2,000 mg 250 mL/hr over 120 Minutes Intravenous  Once 06/19/17 1726 06/20/17 0553   06/19/17 1437  gentamicin (GARAMYCIN) injection  Status:  Discontinued       As needed 06/19/17 1438 06/19/17 1459   06/19/17 1436  vancomycin (VANCOCIN) powder  Status:  Discontinued       As needed 06/19/17 1437 06/19/17 1459   06/19/17 1330  ceFAZolin (ANCEF) IVPB 2g/100 mL premix     2  g 200 mL/hr over 30 Minutes Intravenous To [facility]Stay [facility] 06/18/17 1227 06/19/17 1423    .  Patient was given sequential compression devices, early ambulation, and aspirin for DVT prophylaxis.  Recent vital signs:  Patient Vitals for the past 24 hrs:  BP Temp Temp src Pulse Resp SpO2  06/22/17 0506 139/82 97.7 F (36.5 C) Oral (!) 103 16 99 %  06/21/17 2027 114/74 98.1 F (36.7 C) Oral 95 18 99 %  06/21/17 1521 136/86 97.6 F (36.4 C) Oral (!) 107 - 98 %  .  Recent laboratory studies: No results found.  Discharge Medications:   Allergies as of 06/22/2017   No Known Allergies     Medication List    STOP taking these medications   doxycycline 100 MG tablet Commonly known as:  VIBRA-TABS     TAKE these medications   allopurinol 100 MG tablet Commonly known as:  ZYLOPRIM Take 1 tablet (100 mg total) by mouth 2 (two) times daily.   aspirin EC 81 MG tablet Take 81 mg by mouth daily.   ceFAZolin IVPB Commonly known as:  ANCEF Inject 2 g every 8 (eight) hours into the vein. Indication:  Osteomyelitis  Last Day of Therapy:  07/31/2017 Labs - Once weekly:  CBC/D and BMP, Labs - Every other week:  ESR and CRP   Colchicine 0.6 MG Caps Take 1 capsule by mouth twice daily as needed for gout pain. Discontinue once  pain resolves. Restart in event of acute gout flare up.   Colchicine 0.6 MG Caps Take 0.6 mg by mouth 2 (two) times daily as needed.   cyclobenzaprine 10 MG tablet Commonly known as:  FLEXERIL Take 10 mg by mouth at bedtime.   diphenhydrAMINE 25 MG tablet Commonly known as:  BENADRYL Take 25 mg by mouth 2 (two) times daily.   doxepin 25 MG capsule Commonly known as:  SINEQUAN Take 25 mg by mouth at bedtime.   gabapentin 300 MG capsule Commonly known as:  NEURONTIN Take 200 mg at bedtime by mouth.   ibuprofen 800 MG tablet Commonly known as:  ADVIL,MOTRIN Take 800 mg by mouth every 8 (eight) hours as needed for headache or moderate pain.    lisinopril-hydrochlorothiazide 20-12.5 MG tablet Commonly known as:  PRINZIDE,ZESTORETIC Take 1 tablet by mouth daily.   multivitamin with minerals Tabs tablet Take 1 tablet by mouth daily.   oxyCODONE-acetaminophen 5-325 MG tablet Commonly known as:  ROXICET Take 1 tablet by mouth daily as needed for severe pain. What changed:  Another medication with the same name was added. Make sure you understand how and when to take each.   oxyCODONE-acetaminophen 5-325 MG tablet Commonly known as:  PERCOCET/ROXICET Take 1 tablet every 4 (four) hours as needed by mouth for severe pain. What changed:  You were already taking a medication with the same name, and this prescription was added. Make sure you understand how and when to take each.   pravastatin 20 MG tablet Commonly known as:  PRAVACHOL Take 20 mg by mouth at bedtime.   sildenafil 20 MG tablet Commonly known as:  REVATIO TAKE 40 MG BY MOUTH APPROXIMATELY 30 MINUTES PRIOR TO SEXUAL ACTIVITY AS NEEDED   vitamin B-12 500 MCG tablet Commonly known as:  CYANOCOBALAMIN Take 500 mcg by mouth daily.            Home Infusion Instuctions  (From admission, onward)        Start     Ordered   06/22/17 0000  Home infusion instructions [facility] May follow [place] Dosing Protocol; May administer Cathflo as needed to maintain patency of vascular access device.; Flushing of vascular access device: per [facility] Protocol: 0.9% NaCl pre/post medica...    Question Answer Comment  Instructions May follow [place] Dosing Protocol   Instructions May administer Cathflo as needed to maintain patency of vascular access device.   Instructions Flushing of vascular access device: per [facility] Protocol: 0.9% NaCl pre/post medication administration and prn patency; Heparin 100 u/ml, 57m for implanted ports and Heparin 10u/ml, 532mfor all other central venous catheters.   Instructions May follow [facility] Anaphylaxis Protocol for First Dose  Administration in the home: 0.9% NaCl at 25-50 ml/hr to maintain IV access for protocol meds. Epinephrine 0.3 ml IV/IM PRN and Benadryl 25-50 IV/IM PRN s/s of anaphylaxis.   Instructions [facility] Infusion Coordinator (RN) to assist per patient IV care needs in the home PRN.      06/22/17 [id number]    Diagnostic Studies: No results found.  Patient benefited maximally from their hospital stay and there were no complications.     Disposition: 01-Home or Self Care Discharge Instructions    Call MD / Call 911   Complete by:  As directed    If you experience chest pain or shortness of breath, CALL 911 and be transported to the hospital emergency room.  If you develope a fever above 101  F, pus (white drainage) or increased drainage or redness at the wound, or calf pain, call your surgeon's office.   Call MD / Call 911   Complete by:  As directed    If you experience chest pain or shortness of breath, CALL 911 and be transported to the hospital emergency room.  If you develope a fever above 101 F, pus (white drainage) or increased drainage or redness at the wound, or calf pain, call your surgeon's office.   Constipation Prevention   Complete by:  As directed    Drink plenty of fluids.  Prune juice may be helpful.  You may use a stool softener, such as Colace (over the counter) 100 mg twice a day.  Use MiraLax (over the counter) for constipation as needed.   Constipation Prevention   Complete by:  As directed    Drink plenty of fluids.  Prune juice may be helpful.  You may use a stool softener, such as Colace (over the counter) 100 mg twice a day.  Use MiraLax (over the counter) for constipation as needed.   Diet - low sodium heart healthy   Complete by:  As directed    Diet - low sodium heart healthy   Complete by:  As directed    Home infusion instructions [facility] May follow [place] Dosing Protocol; May administer Cathflo as needed to maintain patency of vascular access  device.; Flushing of vascular access device: per [facility] Protocol: 0.9% NaCl pre/post medica...   Complete by:  As directed    Instructions:  May follow [place] Dosing Protocol   Instructions:  May administer Cathflo as needed to maintain patency of vascular access device.   Instructions:  Flushing of vascular access device: per [facility] Protocol: 0.9% NaCl pre/post medication administration and prn patency; Heparin 100 u/ml, 92m for implanted ports and Heparin 10u/ml, 561mfor all other central venous catheters.   Instructions:  May follow [facility] Anaphylaxis Protocol for First Dose Administration in the home: 0.9% NaCl at 25-50 ml/hr to maintain IV access for protocol meds. Epinephrine 0.3 ml IV/IM PRN and Benadryl 25-50 IV/IM PRN s/s of anaphylaxis.   Instructions:  [facility] Coordinator (RN) to assist per patient IV care needs in the home PRN.   Increase activity slowly as tolerated   Complete by:  As directed    Increase activity slowly as tolerated   Complete by:  As directed    Negative Pressure Wound Therapy - Incisional   Complete by:  As directed    Obtain Prevena plus from the OR, d/c with Prevena Plus suction, continue for 1 week, plug in to keep pump charged     Follow-up Information    DuNewt MinionMD Follow up in 1 week(s).   Specialty:  Orthopedic Surgery Contact information: [id number]          Signed: MaNewt Minion1/19/2018, 6:32 AM

## 2017-06-22 NOTE — Plan of Care (Signed)
  Education: Knowledge of General Education information will improve 06/22/2017 1055 - Progressing by Darrow BussingArcilla, Myracle Febres M, RN

## 2017-06-22 NOTE — Care Management Note (Addendum)
Case Management Note  Patient Details  Name: Charles Blankenship MRN: [id number] Date of Birth: 10/26/1960  Subjective/Objective:   56 yr old male s/p  DEBRIDEMENT CALCANEUS RIGHT FOOT, PLACE ANTIBIOTIC BEADS on 06/19/2017               Action/Plan:  Case manager spoke with patient concerning discharge plan. Patient very rude and not listening. Says he "has business to take care of". CM attempted to  Explain necessity of waiting for today's antibiotic dose, to have PICC line capped off and to be seen by [facility] IV specialist. CM explained to patient's wife the plan and she voiced understanding. [facility]    Expected Discharge Date:  06/22/17               Expected Discharge Plan:  Home w Home Health Services  In-House Referral:     Discharge planning Services  CM Consult  Post Acute Care Choice:  Home Health Choice offered to:  Patient, Spouse  DME Arranged:  IV pump/equipment DME Agency:  [facility]  HH Arranged:  RN, IV Antibiotics HH Agency:  [facility]  Status of Service:  Completed, signed off  If discussed at Long Length of Stay Meetings, dates discussed:    Additional Comments:  Durenda GuthrieBrady, Sherby Moncayo Naomi, RN 06/22/2017, 9:52 AM

## 2017-06-22 NOTE — Progress Notes (Signed)
Provided discharge education/instructions, all questions and concerns addressed, Pt not in distress, discharged home accompanied by nephew with Pt belongings.

## 2017-06-24 ENCOUNTER — Telehealth (INDEPENDENT_AMBULATORY_CARE_PROVIDER_SITE_OTHER): Payer: Self-pay | Admitting: Orthopedic Surgery

## 2017-06-24 LAB — AEROBIC/ANAEROBIC CULTURE (SURGICAL/DEEP WOUND)

## 2017-06-24 LAB — AEROBIC/ANAEROBIC CULTURE W GRAM STAIN (SURGICAL/DEEP WOUND)

## 2017-06-24 NOTE — Telephone Encounter (Signed)
I called to give verbal order for home health nursing iv abx.

## 2017-06-24 NOTE — Telephone Encounter (Signed)
Walington,Archer  05-Nov-1960    [facility] Elnita MaxwellCheryl  [phone] Pt was discharged yesterday with IV administration for 6 weeks.Physical Therapist would like to know if these orders  can be approved.

## 2017-06-29 ENCOUNTER — Ambulatory Visit (INDEPENDENT_AMBULATORY_CARE_PROVIDER_SITE_OTHER): Payer: Medicaid Other | Admitting: Family

## 2017-06-29 ENCOUNTER — Encounter (INDEPENDENT_AMBULATORY_CARE_PROVIDER_SITE_OTHER): Payer: Self-pay | Admitting: Orthopedic Surgery

## 2017-06-29 DIAGNOSIS — M1A0710 Idiopathic chronic gout, right ankle and foot, without tophus (tophi): Secondary | ICD-10-CM

## 2017-06-29 DIAGNOSIS — M86171 Other acute osteomyelitis, right ankle and foot: Secondary | ICD-10-CM

## 2017-06-29 MED ORDER — COLCHICINE 0.6 MG PO CAPS: ORAL_CAPSULE | ORAL | 3 refills | Status: DC

## 2017-06-29 MED ORDER — ALLOPURINOL 100 MG PO TABS: 100.0000 mg | ORAL_TABLET | Freq: Two times a day (BID) | ORAL | 3 refills | Status: DC

## 2017-06-29 MED ORDER — OXYCODONE-ACETAMINOPHEN 5-325 MG PO TABS: 1.0000 | ORAL_TABLET | Freq: Four times a day (QID) | ORAL | 0 refills | Status: DC | PRN

## 2017-06-30 ENCOUNTER — Telehealth (INDEPENDENT_AMBULATORY_CARE_PROVIDER_SITE_OTHER): Payer: Self-pay | Admitting: Orthopedic Surgery

## 2017-06-30 NOTE — Telephone Encounter (Signed)
Carollee HerterShannon from [facility] needs verbal orders concerning wound care for patient. Her CB # [phone].
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
## 2017-07-01 NOTE — Telephone Encounter (Signed)
I called and left voicemail patient nonweightbearing and elevation.  Daily Dial soap cleansing followed by dry dressing changes daily. We will see in two weeks to evaluate for possible suture removal.

## 2017-07-09 ENCOUNTER — Other Ambulatory Visit: Payer: Self-pay | Admitting: Pharmacist

## 2017-07-13 ENCOUNTER — Ambulatory Visit (INDEPENDENT_AMBULATORY_CARE_PROVIDER_SITE_OTHER): Payer: Medicaid Other | Admitting: Orthopedic Surgery

## 2017-07-15 ENCOUNTER — Inpatient Hospital Stay: Payer: Medicaid Other | Admitting: Internal Medicine

## 2017-07-17 ENCOUNTER — Other Ambulatory Visit: Payer: Self-pay | Admitting: Pharmacist

## 2017-07-20 ENCOUNTER — Ambulatory Visit (INDEPENDENT_AMBULATORY_CARE_PROVIDER_SITE_OTHER): Payer: Medicaid Other | Admitting: Family

## 2017-07-20 ENCOUNTER — Encounter (INDEPENDENT_AMBULATORY_CARE_PROVIDER_SITE_OTHER): Payer: Self-pay | Admitting: Family

## 2017-07-20 VITALS — Ht 72.0 in | Wt 230.0 lb

## 2017-07-20 DIAGNOSIS — M86171 Other acute osteomyelitis, right ankle and foot: Secondary | ICD-10-CM

## 2017-07-20 MED ORDER — OXYCODONE-ACETAMINOPHEN 5-325 MG PO TABS: 1.0000 | ORAL_TABLET | Freq: Three times a day (TID) | ORAL | 0 refills | Status: DC | PRN
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
## 2017-07-22 ENCOUNTER — Telehealth (INDEPENDENT_AMBULATORY_CARE_PROVIDER_SITE_OTHER): Payer: Self-pay | Admitting: Orthopedic Surgery

## 2017-07-22 NOTE — Telephone Encounter (Signed)
Submitted on n[place]racks [place] to [place]. Will call pharmacy tomorrow and recheck portal online tomorrow.

## 2017-07-22 NOTE — Telephone Encounter (Signed)
Patients pharmacy needs prior authorization to release  Colchicine 0.6 mg capsules.   [place] [facility] [place] # [phone]  Patient also wants a call back once this has been done # [phone]

## 2017-07-24 MED ORDER — COLCHICINE 0.6 MG PO CAPS: ORAL_CAPSULE | ORAL | 2 refills | Status: DC

## 2017-07-24 NOTE — Telephone Encounter (Signed)
Spoke with patient oxycodone rx approved, he went ahead and paid out of pocket on 12/17. Advised I could not help him with reimbursement of his money he will have to speak to pharmacy or insurance on that. But it was approved on [place] tracks after submitting new info, fax op note to [phone]. Advised we also sent in [employer] brand name, which is preferred by medicaid. So he can pick this up from his pharmacy. Expressed understanding.

## 2017-07-24 NOTE — Telephone Encounter (Signed)
Oxycodone rx appproved, after resubmitted with recent op note. Faxing to [phone]. Confirmation #: [id number] W Prior Approval #: [id number] Status: APPROVED  Thank you. Your request has been successfully submitted.

## 2017-07-31 ENCOUNTER — Other Ambulatory Visit: Payer: Self-pay | Admitting: Pharmacist

## 2017-08-10 ENCOUNTER — Ambulatory Visit (INDEPENDENT_AMBULATORY_CARE_PROVIDER_SITE_OTHER): Payer: Medicaid Other | Admitting: Family

## 2017-08-10 ENCOUNTER — Ambulatory Visit (INDEPENDENT_AMBULATORY_CARE_PROVIDER_SITE_OTHER): Payer: Medicaid Other | Admitting: Orthopedic Surgery

## 2017-08-10 ENCOUNTER — Encounter (INDEPENDENT_AMBULATORY_CARE_PROVIDER_SITE_OTHER): Payer: Self-pay | Admitting: Orthopedic Surgery

## 2017-08-10 VITALS — Ht 72.0 in | Wt 230.0 lb

## 2017-08-10 DIAGNOSIS — M86171 Other acute osteomyelitis, right ankle and foot: Secondary | ICD-10-CM

## 2017-08-10 MED ORDER — OXYCODONE-ACETAMINOPHEN 5-325 MG PO TABS: 1.0000 | ORAL_TABLET | Freq: Three times a day (TID) | ORAL | 0 refills | Status: DC | PRN
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
## 2017-08-31 ENCOUNTER — Encounter (INDEPENDENT_AMBULATORY_CARE_PROVIDER_SITE_OTHER): Payer: Self-pay | Admitting: Family

## 2017-08-31 ENCOUNTER — Ambulatory Visit (INDEPENDENT_AMBULATORY_CARE_PROVIDER_SITE_OTHER): Payer: Medicaid Other | Admitting: Orthopedic Surgery

## 2017-08-31 DIAGNOSIS — M86171 Other acute osteomyelitis, right ankle and foot: Secondary | ICD-10-CM

## 2017-08-31 MED ORDER — DOXYCYCLINE HYCLATE 100 MG PO TABS: 100.0000 mg | ORAL_TABLET | Freq: Two times a day (BID) | ORAL | 0 refills | Status: DC

## 2017-08-31 MED ORDER — MUPIROCIN 2 % EX OINT: 1.0000 "application " | TOPICAL_OINTMENT | Freq: Two times a day (BID) | CUTANEOUS | 3 refills | Status: AC

## 2017-08-31 MED ORDER — OXYCODONE-ACETAMINOPHEN 5-325 MG PO TABS: 1.0000 | ORAL_TABLET | Freq: Three times a day (TID) | ORAL | 0 refills | Status: DC | PRN
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
## 2017-09-02 ENCOUNTER — Ambulatory Visit (INDEPENDENT_AMBULATORY_CARE_PROVIDER_SITE_OTHER): Payer: Medicaid Other | Admitting: Family

## 2017-09-14 ENCOUNTER — Encounter (INDEPENDENT_AMBULATORY_CARE_PROVIDER_SITE_OTHER): Payer: Self-pay | Admitting: Orthopedic Surgery

## 2017-09-14 ENCOUNTER — Ambulatory Visit (INDEPENDENT_AMBULATORY_CARE_PROVIDER_SITE_OTHER): Payer: Medicaid Other | Admitting: Orthopedic Surgery

## 2017-09-14 DIAGNOSIS — M86171 Other acute osteomyelitis, right ankle and foot: Secondary | ICD-10-CM

## 2017-09-14 MED ORDER — OXYCODONE-ACETAMINOPHEN 5-325 MG PO TABS: 1.0000 | ORAL_TABLET | Freq: Three times a day (TID) | ORAL | 0 refills | Status: DC | PRN
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
## 2017-09-28 ENCOUNTER — Encounter (INDEPENDENT_AMBULATORY_CARE_PROVIDER_SITE_OTHER): Payer: Self-pay | Admitting: Orthopedic Surgery

## 2017-09-28 ENCOUNTER — Ambulatory Visit (INDEPENDENT_AMBULATORY_CARE_PROVIDER_SITE_OTHER): Payer: Medicaid Other | Admitting: Orthopedic Surgery

## 2017-09-28 VITALS — Ht 72.0 in | Wt 230.0 lb

## 2017-09-28 DIAGNOSIS — M86171 Other acute osteomyelitis, right ankle and foot: Secondary | ICD-10-CM

## 2017-09-28 MED ORDER — OXYCODONE-ACETAMINOPHEN 5-325 MG PO TABS: 1.0000 | ORAL_TABLET | Freq: Three times a day (TID) | ORAL | 0 refills | Status: DC | PRN
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
## 2017-10-12 ENCOUNTER — Encounter (INDEPENDENT_AMBULATORY_CARE_PROVIDER_SITE_OTHER): Payer: Self-pay | Admitting: Orthopedic Surgery

## 2017-10-12 ENCOUNTER — Ambulatory Visit (INDEPENDENT_AMBULATORY_CARE_PROVIDER_SITE_OTHER): Payer: Medicaid Other | Admitting: Orthopedic Surgery

## 2017-10-12 VITALS — Ht 72.0 in | Wt 230.0 lb

## 2017-10-12 DIAGNOSIS — M86171 Other acute osteomyelitis, right ankle and foot: Secondary | ICD-10-CM | POA: Diagnosis not present

## 2017-10-12 MED ORDER — OXYCODONE-ACETAMINOPHEN 5-325 MG PO TABS: 1.0000 | ORAL_TABLET | Freq: Three times a day (TID) | ORAL | 0 refills | Status: DC | PRN
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
## 2017-10-26 ENCOUNTER — Encounter (INDEPENDENT_AMBULATORY_CARE_PROVIDER_SITE_OTHER): Payer: Self-pay | Admitting: Orthopedic Surgery

## 2017-10-26 ENCOUNTER — Ambulatory Visit (INDEPENDENT_AMBULATORY_CARE_PROVIDER_SITE_OTHER): Payer: Medicaid Other | Admitting: Orthopedic Surgery

## 2017-10-26 ENCOUNTER — Ambulatory Visit (INDEPENDENT_AMBULATORY_CARE_PROVIDER_SITE_OTHER): Payer: Medicaid Other

## 2017-10-26 VITALS — Ht 72.0 in | Wt 230.0 lb

## 2017-10-26 DIAGNOSIS — M86171 Other acute osteomyelitis, right ankle and foot: Secondary | ICD-10-CM

## 2017-10-26 MED ORDER — COLCHICINE 0.6 MG PO CAPS: ORAL_CAPSULE | ORAL | 2 refills | Status: DC

## 2017-10-26 MED ORDER — ALLOPURINOL 100 MG PO TABS: 100.0000 mg | ORAL_TABLET | Freq: Two times a day (BID) | ORAL | 3 refills | Status: DC

## 2017-10-26 NOTE — Addendum Note (Signed)
Addended by: Aldean BakerUDA, Kimberlyann Hollar on: 10/26/2017 03:13 PM   Modules accepted: Orders
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
## 2017-10-27 ENCOUNTER — Encounter (HOSPITAL_COMMUNITY): Payer: Self-pay | Admitting: *Deleted

## 2017-10-27 ENCOUNTER — Other Ambulatory Visit: Payer: Self-pay

## 2017-10-27 ENCOUNTER — Other Ambulatory Visit (INDEPENDENT_AMBULATORY_CARE_PROVIDER_SITE_OTHER): Payer: Self-pay | Admitting: Orthopedic Surgery

## 2017-10-27 DIAGNOSIS — M869 Osteomyelitis, unspecified: Secondary | ICD-10-CM

## 2017-10-27 NOTE — Progress Notes (Signed)
Pt denies SOB, chest pain, and being under the care of a cardiologist. Pt denies having a stress test, echo and cardiac cath. Pt denies having a chest x ray within the last year. Pt denies recent labs. Pt made aware to stop taking vitamins, fish oil and herbal medications. Do not take any NSAIDs ie: Ibuprofen, Advil, Naproxen (Aleve), Motrin, BC and Goody Powder. Pt verbalized understanding of all pre-op instructions. 

## 2017-10-28 ENCOUNTER — Encounter (HOSPITAL_COMMUNITY)
Admission: RE | Disposition: A | Discharge status: Home Health | Payer: Self-pay | Source: Ambulatory Visit | Attending: Orthopedic Surgery

## 2017-10-28 ENCOUNTER — Inpatient Hospital Stay (HOSPITAL_COMMUNITY): Payer: Medicaid Other | Admitting: Certified Registered"

## 2017-10-28 ENCOUNTER — Encounter (HOSPITAL_COMMUNITY): Payer: Self-pay

## 2017-10-28 ENCOUNTER — Inpatient Hospital Stay (HOSPITAL_COMMUNITY)
Admission: RE | Admit: 2017-10-28 | Discharge: 2017-10-31 | DRG: 504 | Disposition: A | Discharge status: Home Health | Payer: Medicaid Other | Source: Ambulatory Visit | Attending: Orthopedic Surgery | Admitting: Orthopedic Surgery

## 2017-10-28 DIAGNOSIS — Z96641 Presence of right artificial hip joint: Secondary | ICD-10-CM | POA: Diagnosis present

## 2017-10-28 DIAGNOSIS — B9562 Methicillin resistant Staphylococcus aureus infection as the cause of diseases classified elsewhere: Secondary | ICD-10-CM | POA: Diagnosis present

## 2017-10-28 DIAGNOSIS — G629 Polyneuropathy, unspecified: Secondary | ICD-10-CM | POA: Diagnosis present

## 2017-10-28 DIAGNOSIS — T84629A Infection and inflammatory reaction due to internal fixation device of unspecified bone of leg, initial encounter: Principal | ICD-10-CM | POA: Diagnosis present

## 2017-10-28 DIAGNOSIS — M869 Osteomyelitis, unspecified: Secondary | ICD-10-CM | POA: Diagnosis present

## 2017-10-28 DIAGNOSIS — M86271 Subacute osteomyelitis, right ankle and foot: Secondary | ICD-10-CM | POA: Diagnosis present

## 2017-10-28 DIAGNOSIS — I10 Essential (primary) hypertension: Secondary | ICD-10-CM | POA: Diagnosis present

## 2017-10-28 DIAGNOSIS — Z87891 Personal history of nicotine dependence: Secondary | ICD-10-CM

## 2017-10-28 DIAGNOSIS — M109 Gout, unspecified: Secondary | ICD-10-CM | POA: Diagnosis present

## 2017-10-28 DIAGNOSIS — A4902 Methicillin resistant Staphylococcus aureus infection, unspecified site: Secondary | ICD-10-CM

## 2017-10-28 DIAGNOSIS — F4310 Post-traumatic stress disorder, unspecified: Secondary | ICD-10-CM | POA: Diagnosis present

## 2017-10-28 DIAGNOSIS — T8149XD Infection following a procedure, other surgical site, subsequent encounter: Secondary | ICD-10-CM | POA: Diagnosis not present

## 2017-10-28 DIAGNOSIS — E785 Hyperlipidemia, unspecified: Secondary | ICD-10-CM | POA: Diagnosis present

## 2017-10-28 DIAGNOSIS — Z978 Presence of other specified devices: Secondary | ICD-10-CM | POA: Diagnosis not present

## 2017-10-28 DIAGNOSIS — L02611 Cutaneous abscess of right foot: Secondary | ICD-10-CM | POA: Diagnosis present

## 2017-10-28 HISTORY — PX: I&D EXTREMITY: SHX5045

## 2017-10-28 HISTORY — DX: Osteomyelitis, unspecified: M86.9

## 2017-10-28 HISTORY — PX: I & D EXTREMITY: SHX5045

## 2017-10-28 LAB — CBC
HCT: 29.9 % — ABNORMAL LOW (ref 39.0–52.0)
HEMOGLOBIN: 9.5 g/dL — AB (ref 13.0–17.0)
MCH: 24 pg — ABNORMAL LOW (ref 26.0–34.0)
MCHC: 31.8 g/dL (ref 30.0–36.0)
MCV: 75.5 fL — AB (ref 78.0–100.0)
Platelets: 302 10*3/uL (ref 150–400)
RBC: 3.96 MIL/uL — AB (ref 4.22–5.81)
RDW: 17.8 % — ABNORMAL HIGH (ref 11.5–15.5)
WBC: 8.1 10*3/uL (ref 4.0–10.5)

## 2017-10-28 LAB — BASIC METABOLIC PANEL
ANION GAP: 11 (ref 5–15)
BUN: 10 mg/dL (ref 6–20)
CHLORIDE: 107 mmol/L (ref 101–111)
CO2: 22 mmol/L (ref 22–32)
Calcium: 9.6 mg/dL (ref 8.9–10.3)
Creatinine, Ser: 1.2 mg/dL (ref 0.61–1.24)
GFR calc Af Amer: >60 mL/min (ref >60)
GFR calc non Af Amer: >60 mL/min (ref >60)
GLUCOSE: 140 mg/dL — AB (ref 65–99)
POTASSIUM: 3.5 mmol/L (ref 3.5–5.1)
SODIUM: 140 mmol/L (ref 135–145)

## 2017-10-28 SURGERY — IRRIGATION AND DEBRIDEMENT EXTREMITY
Anesthesia: General | Laterality: Right

## 2017-10-28 MED ORDER — METOCLOPRAMIDE HCL 5 MG PO TABS: 5.0000 mg | ORAL_TABLET | Freq: Three times a day (TID) | ORAL | Status: DC | PRN

## 2017-10-28 MED ORDER — ONDANSETRON HCL 4 MG/2ML IJ SOLN
INTRAMUSCULAR | Status: AC
  Filled 2017-10-28: qty 2

## 2017-10-28 MED ORDER — METOCLOPRAMIDE HCL 5 MG/ML IJ SOLN: 5.0000 mg | Freq: Three times a day (TID) | INTRAMUSCULAR | Status: DC | PRN

## 2017-10-28 MED ORDER — BISACODYL 10 MG RE SUPP: 10.0000 mg | Freq: Every day | RECTAL | Status: DC | PRN

## 2017-10-28 MED ORDER — CEFAZOLIN SODIUM-DEXTROSE 1-4 GM/50ML-% IV SOLN
1.0000 g | Freq: Three times a day (TID) | INTRAVENOUS | Status: AC
  Administered 2017-10-28 – 2017-10-31 (×9): 1 g via INTRAVENOUS
  Filled 2017-10-28 (×10): qty 50

## 2017-10-28 MED ORDER — MAGNESIUM CITRATE PO SOLN: 1.0000 | Freq: Once | ORAL | Status: DC | PRN

## 2017-10-28 MED ORDER — MIDAZOLAM HCL 2 MG/2ML IJ SOLN
INTRAMUSCULAR | Status: AC
  Filled 2017-10-28: qty 2

## 2017-10-28 MED ORDER — ASPIRIN EC 81 MG PO TBEC
81.0000 mg | DELAYED_RELEASE_TABLET | Freq: Every day | ORAL | Status: DC
  Administered 2017-10-28 – 2017-10-31 (×4): 81 mg via ORAL
  Filled 2017-10-28 (×4): qty 1

## 2017-10-28 MED ORDER — KETOROLAC TROMETHAMINE 30 MG/ML IJ SOLN
INTRAMUSCULAR | Status: AC
  Filled 2017-10-28: qty 1

## 2017-10-28 MED ORDER — MEPERIDINE HCL 50 MG/ML IJ SOLN: 6.2500 mg | INTRAMUSCULAR | Status: DC | PRN

## 2017-10-28 MED ORDER — ADULT MULTIVITAMIN W/MINERALS CH
1.0000 | ORAL_TABLET | Freq: Every day | ORAL | Status: DC
  Administered 2017-10-28 – 2017-10-31 (×4): 1 via ORAL
  Filled 2017-10-28 (×4): qty 1

## 2017-10-28 MED ORDER — FENTANYL CITRATE (PF) 100 MCG/2ML IJ SOLN
INTRAMUSCULAR | Status: AC
  Filled 2017-10-28: qty 2

## 2017-10-28 MED ORDER — POLYETHYLENE GLYCOL 3350 17 G PO PACK: 17.0000 g | PACK | Freq: Every day | ORAL | Status: DC | PRN

## 2017-10-28 MED ORDER — FENTANYL CITRATE (PF) 250 MCG/5ML IJ SOLN
INTRAMUSCULAR | Status: AC
  Filled 2017-10-28: qty 5

## 2017-10-28 MED ORDER — VANCOMYCIN HCL 500 MG IV SOLR
INTRAVENOUS | Status: DC | PRN
  Administered 2017-10-28: 500 mg via TOPICAL

## 2017-10-28 MED ORDER — GLYCOPYRROLATE 0.2 MG/ML IV SOSY
PREFILLED_SYRINGE | INTRAVENOUS | Status: DC | PRN
  Administered 2017-10-28: .2 mg via INTRAVENOUS

## 2017-10-28 MED ORDER — DEXAMETHASONE SODIUM PHOSPHATE 4 MG/ML IJ SOLN
INTRAMUSCULAR | Status: DC | PRN
  Administered 2017-10-28: 8 mg via INTRAVENOUS

## 2017-10-28 MED ORDER — PROPOFOL 10 MG/ML IV BOLUS
INTRAVENOUS | Status: DC | PRN
  Administered 2017-10-28: 200 mg via INTRAVENOUS

## 2017-10-28 MED ORDER — METHOCARBAMOL 1000 MG/10ML IJ SOLN
500.0000 mg | Freq: Four times a day (QID) | INTRAVENOUS | Status: DC | PRN
  Filled 2017-10-28: qty 5

## 2017-10-28 MED ORDER — LISINOPRIL-HYDROCHLOROTHIAZIDE 20-12.5 MG PO TABS: 1.0000 | ORAL_TABLET | Freq: Every day | ORAL | Status: DC

## 2017-10-28 MED ORDER — LIDOCAINE HCL (CARDIAC) 20 MG/ML IV SOLN
INTRAVENOUS | Status: AC
  Filled 2017-10-28: qty 5

## 2017-10-28 MED ORDER — ALLOPURINOL 100 MG PO TABS
100.0000 mg | ORAL_TABLET | Freq: Two times a day (BID) | ORAL | Status: DC
  Administered 2017-10-28 – 2017-10-31 (×6): 100 mg via ORAL
  Filled 2017-10-28 (×6): qty 1

## 2017-10-28 MED ORDER — SODIUM CHLORIDE 0.9 % IR SOLN
Status: DC | PRN
  Administered 2017-10-28: 3000 mL

## 2017-10-28 MED ORDER — CEFAZOLIN SODIUM-DEXTROSE 2-4 GM/100ML-% IV SOLN
2.0000 g | INTRAVENOUS | Status: DC
  Filled 2017-10-28: qty 100

## 2017-10-28 MED ORDER — KETOROLAC TROMETHAMINE 30 MG/ML IJ SOLN
30.0000 mg | Freq: Once | INTRAMUSCULAR | Status: DC | PRN
  Administered 2017-10-28: 30 mg via INTRAVENOUS

## 2017-10-28 MED ORDER — DOCUSATE SODIUM 100 MG PO CAPS
100.0000 mg | ORAL_CAPSULE | Freq: Two times a day (BID) | ORAL | Status: DC
  Administered 2017-10-28 – 2017-10-31 (×6): 100 mg via ORAL
  Filled 2017-10-28 (×6): qty 1

## 2017-10-28 MED ORDER — OXYCODONE-ACETAMINOPHEN 5-325 MG PO TABS
1.0000 | ORAL_TABLET | ORAL | Status: DC | PRN
  Administered 2017-10-28 – 2017-10-31 (×14): 2 via ORAL
  Filled 2017-10-28 (×9): qty 2
  Filled 2017-10-28: qty 1
  Filled 2017-10-28 (×4): qty 2
  Filled 2017-10-28: qty 1

## 2017-10-28 MED ORDER — LISINOPRIL 20 MG PO TABS
20.0000 mg | ORAL_TABLET | Freq: Every day | ORAL | Status: DC
  Administered 2017-10-28 – 2017-10-31 (×4): 20 mg via ORAL
  Filled 2017-10-28 (×4): qty 1

## 2017-10-28 MED ORDER — ONDANSETRON HCL 4 MG/2ML IJ SOLN
INTRAMUSCULAR | Status: DC | PRN
  Administered 2017-10-28: 4 mg via INTRAVENOUS

## 2017-10-28 MED ORDER — SODIUM CHLORIDE 0.9 % IV SOLN: 0.5000 mg/h | INTRAVENOUS | Status: DC | PRN

## 2017-10-28 MED ORDER — FENTANYL CITRATE (PF) 100 MCG/2ML IJ SOLN
25.0000 ug | INTRAMUSCULAR | Status: DC | PRN
  Administered 2017-10-28 (×2): 50 ug via INTRAVENOUS

## 2017-10-28 MED ORDER — DEXAMETHASONE SODIUM PHOSPHATE 10 MG/ML IJ SOLN
INTRAMUSCULAR | Status: AC
  Filled 2017-10-28: qty 1

## 2017-10-28 MED ORDER — GABAPENTIN 100 MG PO CAPS
200.0000 mg | ORAL_CAPSULE | Freq: Every day | ORAL | Status: DC
  Administered 2017-10-28 – 2017-10-30 (×3): 200 mg via ORAL
  Filled 2017-10-28 (×3): qty 2

## 2017-10-28 MED ORDER — METHOCARBAMOL 500 MG PO TABS
500.0000 mg | ORAL_TABLET | Freq: Four times a day (QID) | ORAL | Status: DC | PRN
  Administered 2017-10-28 – 2017-10-31 (×6): 500 mg via ORAL
  Filled 2017-10-28 (×6): qty 1

## 2017-10-28 MED ORDER — ONDANSETRON HCL 4 MG PO TABS: 4.0000 mg | ORAL_TABLET | Freq: Four times a day (QID) | ORAL | Status: DC | PRN

## 2017-10-28 MED ORDER — METHOCARBAMOL 500 MG PO TABS
ORAL_TABLET | ORAL | Status: AC
  Filled 2017-10-28: qty 1

## 2017-10-28 MED ORDER — GENTAMICIN SULFATE 40 MG/ML IJ SOLN
INTRAMUSCULAR | Status: DC | PRN
  Administered 2017-10-28 (×2): 80 mg

## 2017-10-28 MED ORDER — ONDANSETRON HCL 4 MG/2ML IJ SOLN: 4.0000 mg | Freq: Four times a day (QID) | INTRAMUSCULAR | Status: DC | PRN

## 2017-10-28 MED ORDER — PHENYLEPHRINE 40 MCG/ML (10ML) SYRINGE FOR IV PUSH (FOR BLOOD PRESSURE SUPPORT)
PREFILLED_SYRINGE | INTRAVENOUS | Status: DC | PRN
  Administered 2017-10-28: 80 ug via INTRAVENOUS
  Administered 2017-10-28: 120 ug via INTRAVENOUS

## 2017-10-28 MED ORDER — PROPOFOL 10 MG/ML IV BOLUS
INTRAVENOUS | Status: AC
  Filled 2017-10-28: qty 20

## 2017-10-28 MED ORDER — SODIUM CHLORIDE 0.9 % IV SOLN
INTRAVENOUS | Status: DC
  Administered 2017-10-28: 19:00:00 via INTRAVENOUS

## 2017-10-28 MED ORDER — HYDROMORPHONE HCL 1 MG/ML IJ SOLN: 0.5000 mg | INTRAMUSCULAR | Status: DC | PRN

## 2017-10-28 MED ORDER — LACTATED RINGERS IV SOLN
INTRAVENOUS | Status: DC | PRN
  Administered 2017-10-28: 15:00:00 via INTRAVENOUS

## 2017-10-28 MED ORDER — PROMETHAZINE HCL 25 MG/ML IJ SOLN: 6.2500 mg | INTRAMUSCULAR | Status: DC | PRN

## 2017-10-28 MED ORDER — LIDOCAINE 2% (20 MG/ML) 5 ML SYRINGE
INTRAMUSCULAR | Status: DC | PRN
  Administered 2017-10-28: 100 mg via INTRAVENOUS

## 2017-10-28 MED ORDER — CHLORHEXIDINE GLUCONATE 4 % EX LIQD: 60.0000 mL | Freq: Once | CUTANEOUS | Status: DC

## 2017-10-28 MED ORDER — 0.9 % SODIUM CHLORIDE (POUR BTL) OPTIME
TOPICAL | Status: DC | PRN
  Administered 2017-10-28: 1000 mL

## 2017-10-28 MED ORDER — HYDROCHLOROTHIAZIDE 12.5 MG PO CAPS
12.5000 mg | ORAL_CAPSULE | Freq: Every day | ORAL | Status: DC
  Administered 2017-10-28 – 2017-10-31 (×4): 12.5 mg via ORAL
  Filled 2017-10-28 (×4): qty 1

## 2017-10-28 MED ORDER — PRAVASTATIN SODIUM 10 MG PO TABS
20.0000 mg | ORAL_TABLET | Freq: Every day | ORAL | Status: DC
  Administered 2017-10-28 – 2017-10-30 (×3): 20 mg via ORAL
  Filled 2017-10-28 (×3): qty 2

## 2017-10-28 MED ORDER — FENTANYL CITRATE (PF) 100 MCG/2ML IJ SOLN
INTRAMUSCULAR | Status: DC | PRN
  Administered 2017-10-28 (×2): 50 ug via INTRAVENOUS
  Administered 2017-10-28: 100 ug via INTRAVENOUS
  Administered 2017-10-28: 50 ug via INTRAVENOUS

## 2017-10-28 MED ORDER — HYDROMORPHONE HCL 1 MG/ML IJ SOLN
INTRAMUSCULAR | Status: AC
  Administered 2017-10-28: 1 mg
  Filled 2017-10-28: qty 1

## 2017-10-28 MED ORDER — DOXEPIN HCL 25 MG PO CAPS
25.0000 mg | ORAL_CAPSULE | Freq: Every day | ORAL | Status: DC
  Administered 2017-10-28 – 2017-10-30 (×3): 25 mg via ORAL
  Filled 2017-10-28 (×4): qty 1

## 2017-10-28 MED ORDER — MIDAZOLAM HCL 5 MG/5ML IJ SOLN
INTRAMUSCULAR | Status: DC | PRN
  Administered 2017-10-28: 2 mg via INTRAVENOUS

## 2017-10-28 SURGICAL SUPPLY — 40 items
BLADE SURG 21 STRL SS (BLADE) ×3 IMPLANT
BNDG COHESIVE 6X5 TAN STRL LF (GAUZE/BANDAGES/DRESSINGS) IMPLANT
BNDG GAUZE ELAST 4 BULKY (GAUZE/BANDAGES/DRESSINGS) ×6 IMPLANT
CANISTER SUCTION 1500CC (MISCELLANEOUS) ×3 IMPLANT
CONT SPEC 4OZ CLIKSEAL STRL BL (MISCELLANEOUS) ×3 IMPLANT
COVER SURGICAL LIGHT HANDLE (MISCELLANEOUS) ×3 IMPLANT
DRAPE INCISE IOBAN 66X45 STRL (DRAPES) ×3 IMPLANT
DRAPE U-SHAPE 47X51 STRL (DRAPES) ×3 IMPLANT
DRESSING PREVENA PLUS CUSTOM (GAUZE/BANDAGES/DRESSINGS) ×1 IMPLANT
DRSG ADAPTIC 3X8 NADH LF (GAUZE/BANDAGES/DRESSINGS) ×3 IMPLANT
DRSG PREVENA PLUS CUSTOM (GAUZE/BANDAGES/DRESSINGS) ×3
DRSG VAC ATS LRG SENSATRAC (GAUZE/BANDAGES/DRESSINGS) ×3 IMPLANT
DURAPREP 26ML APPLICATOR (WOUND CARE) ×3 IMPLANT
ELECT REM PT RETURN 9FT ADLT (ELECTROSURGICAL) ×3
ELECTRODE REM PT RTRN 9FT ADLT (ELECTROSURGICAL) ×1 IMPLANT
FLUID NSS /IRRIG 3000 ML XXX (IV SOLUTION) ×3 IMPLANT
GAUZE SPONGE 4X4 12PLY STRL (GAUZE/BANDAGES/DRESSINGS) ×3 IMPLANT
GLOVE BIOGEL PI IND STRL 9 (GLOVE) ×1 IMPLANT
GLOVE BIOGEL PI INDICATOR 9 (GLOVE) ×2
GLOVE SURG ORTHO 9.0 STRL STRW (GLOVE) ×3 IMPLANT
GOWN STRL REUS W/ TWL XL LVL3 (GOWN DISPOSABLE) ×2 IMPLANT
GOWN STRL REUS W/TWL XL LVL3 (GOWN DISPOSABLE) ×4
HANDPIECE INTERPULSE COAX TIP (DISPOSABLE)
KIT BASIN OR (CUSTOM PROCEDURE TRAY) ×3 IMPLANT
KIT DRSG PREVENA PLUS 7DAY 125 (MISCELLANEOUS) ×3 IMPLANT
KIT STIMULAN RAPID CURE 5CC (Orthopedic Implant) ×3 IMPLANT
KIT TURNOVER KIT B (KITS) ×3 IMPLANT
MANIFOLD NEPTUNE II (INSTRUMENTS) IMPLANT
NS IRRIG 1000ML POUR BTL (IV SOLUTION) ×3 IMPLANT
PACK ORTHO EXTREMITY (CUSTOM PROCEDURE TRAY) ×3 IMPLANT
PAD ARMBOARD 7.5X6 YLW CONV (MISCELLANEOUS) ×6 IMPLANT
SET HNDPC FAN SPRY TIP SCT (DISPOSABLE) IMPLANT
STOCKINETTE IMPERVIOUS 9X36 MD (GAUZE/BANDAGES/DRESSINGS) IMPLANT
SUT ETHILON 2 0 PSLX (SUTURE) ×6 IMPLANT
SWAB COLLECTION DEVICE MRSA (MISCELLANEOUS) ×3 IMPLANT
SWAB CULTURE ESWAB REG 1ML (MISCELLANEOUS) ×3 IMPLANT
TOWEL OR 17X26 10 PK STRL BLUE (TOWEL DISPOSABLE) ×3 IMPLANT
TUBE CONNECTING 12'X1/4 (SUCTIONS) ×1
TUBE CONNECTING 12X1/4 (SUCTIONS) ×2 IMPLANT
YANKAUER SUCT BULB TIP NO VENT (SUCTIONS) ×3 IMPLANT
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
## 2017-10-28 NOTE — Anesthesia Preprocedure Evaluation (Signed)
Anesthesia Evaluation  Patient identified by MRN, date of birth, ID band Patient awake    Reviewed: Allergy & Precautions, NPO status , Patient's Chart, lab work & pertinent test results  Airway Mallampati: II  TM Distance: >3 FB Neck ROM: Full    Dental  (+) Dental Advisory Given, Partial Upper   Pulmonary Current Smoker, former smoker,    Pulmonary exam normal breath sounds clear to auscultation       Cardiovascular hypertension, Pt. on medications Normal cardiovascular exam Rhythm:Regular Rate:Normal     Neuro/Psych PSYCHIATRIC DISORDERS Anxiety negative neurological ROS     GI/Hepatic negative GI ROS, Neg liver ROS,   Endo/Other  negative endocrine ROSObesity   Renal/GU negative Renal ROS  negative genitourinary   Musculoskeletal  (+) Arthritis ,   Abdominal (+) + obese,   Peds  Hematology negative hematology ROS (+)   Anesthesia Other Findings   Reproductive/Obstetrics                             Anesthesia Physical  Anesthesia Plan  ASA: II  Anesthesia Plan: General   Post-op Pain Management:    Induction: Intravenous  PONV Risk Score and Plan: 3 and Ondansetron, Midazolam and Dexamethasone  Airway Management Planned: LMA  Additional Equipment:   Intra-op Plan:   Post-operative Plan: Extubation in OR  Informed Consent: I have reviewed the patients History and Physical, chart, labs and discussed the procedure including the risks, benefits and alternatives for the proposed anesthesia with the patient or authorized representative who has indicated his/her understanding and acceptance.   Dental advisory given  Plan Discussed with: CRNA and Surgeon  Anesthesia Plan Comments:         Anesthesia Quick Evaluation

## 2017-10-28 NOTE — Progress Notes (Signed)
Orthopedic Tech Progress Note Patient Details:  Charles Blankenship 10/20/1960 [id number]  Ortho Devices Type of Ortho Device: Postop shoe/boot Ortho Device/Splint Location: RLE Ortho Device/Splint Interventions: Ordered, Application   Post Interventions Patient Tolerated: Well Instructions Provided: Care of device   Charles Blankenship, Charles Blankenship 10/28/2017, 6:23 PM

## 2017-10-28 NOTE — Anesthesia Procedure Notes (Signed)
Procedure Name: LMA Insertion Date/Time: 10/28/2017 3:15 PM Performed by: Julian Reil, CRNA Pre-anesthesia Checklist: Patient identified, Emergency Drugs available, Suction available, Patient being monitored and Timeout performed Patient Re-evaluated:Patient Re-evaluated prior to induction Oxygen Delivery Method: Circle system utilized Preoxygenation: Pre-oxygenation with 100% oxygen Induction Type: IV induction Ventilation: Mask ventilation without difficulty LMA: LMA with gastric port inserted LMA Size: 5.0 Tube type: Oral Number of attempts: 1 Placement Confirmation: positive ETCO2 Tube secured with: Tape Dental Injury: Teeth and Oropharynx as per pre-operative assessment

## 2017-10-28 NOTE — Transfer of Care (Signed)
Immediate Anesthesia Transfer of Care Note  Patient: Charles Blankenship  Procedure(s) Performed: IRRIGATION AND DEBRIDEMENT RIGHT CALCANEOUS, PLACE ANTIBIOTIC BEADS (Right )  Patient Location: PACU  Anesthesia Type:General  Level of Consciousness: awake and patient cooperative  Airway & Oxygen Therapy: Patient Spontanous Breathing and Patient connected to face mask oxygen  Post-op Assessment: Report given to RN and Post -op Vital signs reviewed and stable  Post vital signs: Reviewed and stable  Last Vitals:  Vitals Value Taken Time  BP 150/125 10/28/2017  4:03 PM  Temp    Pulse 105 10/28/2017  4:04 PM  Resp 11 10/28/2017  4:04 PM  SpO2 100 % 10/28/2017  4:04 PM  Vitals shown include unvalidated device data.  Last Pain:  Vitals:   10/28/17 1346  TempSrc: Oral  PainSc: 5       Patients Stated Pain Goal: 3 (10/28/17 1346)  Complications: No apparent anesthesia complications

## 2017-10-28 NOTE — Op Note (Signed)
10/28/2017  3:55 PM  PATIENT:  Charles Blankenship    PRE-OPERATIVE DIAGNOSIS:  Osteomyelitis Right Calcaneous  POST-OPERATIVE DIAGNOSIS:  Same  PROCEDURE:  IRRIGATION AND DEBRIDEMENT RIGHT CALCANEOUS, PLACE ANTIBIOTIC BEADS  Calcaneal bone sent for tissue cultures.  2 wound cultures were obtained.  SURGEON:  Nadara MustardMarcus V Chia Mowers, MD  PHYSICIAN ASSISTANT:None ANESTHESIA:   General  PREOPERATIVE INDICATIONS:  Charles Blankenship is a  57 y.o. male with a diagnosis of Osteomyelitis Right Calcaneous who failed conservative measures and elected for surgical management.    The risks benefits and alternatives were discussed with the patient preoperatively including but not limited to the risks of infection, bleeding, nerve injury, cardiopulmonary complications, the need for revision surgery, among others, and the patient was willing to proceed.  OPERATIVE IMPLANTS: Stimulant antibiotic beads 5 cc with 500 mg of vancomycin and 160 mg gentamicin with a Praveena wound VAC.  @ENCIMAGES @  OPERATIVE FINDINGS: Nonviable bone with no large deep abscess.  OPERATIVE PROCEDURE: Patient was brought the operating room and underwent general anesthetic.  After adequate levels of anesthesia were obtained patient was placed in the left lateral decubitus position with the right side up the right lower extremity was prepped using DuraPrep draped into a sterile field a timeout was called.  His previous extensile lateral incision was used the 2 areas of sinus draining tracts were excised with one block of tissue.  This was carried sharply down to bone.  There is some nonviable bone this was resected with an osteotome curette and rondure.  This was sent for tissue samples.  The wound was irrigated with normal saline there is healthy petechial bleeding there was no nonviable soft tissue.  The remainder of the bone was healthy viable and bleeding.  Using an Allgower Donati suture technique 2-0 nylon was used to close the skin.   A incisional Praveena plus wound VAC was applied this had a good suction fit patient was extubated taken the PACU in stable condition.   DISCHARGE PLANNING:  Antibiotic duration: Anticipate 6 weeks of IV antibiotics we will consult infectious disease.  Weightbearing: Nonweightbearing on the right.   Pain medication: As prescribed.  Dressing care/ Wound VAC: Will continue the wound VAC with a Praveena portable pump for 1 week at discharge.  Ambulatory devices: Walker or crutches.  Nonweightbearing on the right.  Discharge to: Anticipate discharge to home.  Follow-up: In the office 1 week post operative.

## 2017-10-29 ENCOUNTER — Encounter (HOSPITAL_COMMUNITY): Payer: Self-pay | Admitting: Orthopedic Surgery

## 2017-10-29 NOTE — Progress Notes (Signed)
Patient ID: Charles OchoaRicky L Lipford, male   DOB: 03/20/1961, 57 y.o.   MRN: [id number] Patient is postoperative day 1 irrigation and debridement infection right calcaneus.  Patient had no gross purulence at the time of surgery however patient's wife states that she pushed out purulent fluid when the patient was at home prior to surgery.  There was good bleeding of the soft tissue and good bleeding of the bone.  Nonviable bone was removed from the fracture site.  This was packed open with antibiotic beads, with 500 mg vancomycin and 160 mg gentamicin..  Cultures are still negative will consult infectious disease.
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
## 2017-10-29 NOTE — Anesthesia Postprocedure Evaluation (Signed)
Anesthesia Post Note  Patient: Charles Blankenship  Procedure(s) Performed: IRRIGATION AND DEBRIDEMENT RIGHT CALCANEOUS, PLACE ANTIBIOTIC BEADS (Right )     Patient location during evaluation: PACU Anesthesia Type: General Level of consciousness: awake Pain management: pain level controlled Vital Signs Assessment: post-procedure vital signs reviewed and stable Respiratory status: spontaneous breathing Cardiovascular status: stable Postop Assessment: no apparent nausea or vomiting Anesthetic complications: no    Last Vitals:  Vitals:   10/29/17 0135 10/29/17 0550  BP: 115/76 (!) 141/86  Pulse: (!) 111 (!) 101  Resp: 19 19  Temp: 37.1 C 36.5 C  SpO2: 97% 99%    Last Pain:  Vitals:   10/29/17 0939  TempSrc:   PainSc: 5    Pain Goal: Patients Stated Pain Goal: 4 (10/29/17 0939)               Tevin Shillingford JR,JOHN Susann GivensFRANKLIN

## 2017-10-29 NOTE — Care Management Note (Signed)
Case Management Note  Patient Details  Name: Heloise OchoaRicky L Geister MRN: [id number] Date of Birth: 08/18/1960  Subjective/Objective:   57 y.o. male s/p R  I&D infection right calcaneus 10/28/17.  PTA, pt independent, lives at home with spouse.                  Action/Plan: PT Recommending no OP follow up.  Will follow for home needs as pt progresses.   Expected Discharge Date:                  Expected Discharge Plan:  Home/Self Care  In-House Referral:     Discharge planning Services  CM Consult  Post Acute Care Choice:    Choice offered to:     DME Arranged:    DME Agency:     HH Arranged:    HH Agency:     Status of Service:  In process, will continue to follow  If discussed at Long Length of Stay Meetings, dates discussed:    Additional Comments:  Glennon Macmerson, Segundo Makela M, RN 10/29/2017, 5:11 PM

## 2017-10-30 ENCOUNTER — Other Ambulatory Visit: Payer: Self-pay

## 2017-10-30 ENCOUNTER — Inpatient Hospital Stay: Payer: Self-pay

## 2017-10-30 ENCOUNTER — Encounter (HOSPITAL_COMMUNITY): Payer: Self-pay | Admitting: General Practice

## 2017-10-30 DIAGNOSIS — T8149XD Infection following a procedure, other surgical site, subsequent encounter: Secondary | ICD-10-CM

## 2017-10-30 DIAGNOSIS — Z978 Presence of other specified devices: Secondary | ICD-10-CM

## 2017-10-30 DIAGNOSIS — A4902 Methicillin resistant Staphylococcus aureus infection, unspecified site: Secondary | ICD-10-CM

## 2017-10-30 DIAGNOSIS — B9562 Methicillin resistant Staphylococcus aureus infection as the cause of diseases classified elsewhere: Secondary | ICD-10-CM

## 2017-10-30 DIAGNOSIS — Z87891 Personal history of nicotine dependence: Secondary | ICD-10-CM

## 2017-10-30 MED ORDER — VANCOMYCIN HCL IN DEXTROSE 750-5 MG/150ML-% IV SOLN
750.0000 mg | Freq: Two times a day (BID) | INTRAVENOUS | Status: DC
  Administered 2017-10-31 (×2): 750 mg via INTRAVENOUS
  Filled 2017-10-30 (×2): qty 150

## 2017-10-30 MED ORDER — SODIUM CHLORIDE 0.9 % IV SOLN
2000.0000 mg | Freq: Once | INTRAVENOUS | Status: AC
  Administered 2017-10-30: 2000 mg via INTRAVENOUS
  Filled 2017-10-30: qty 2000

## 2017-10-30 NOTE — Care Management Note (Signed)
Case Management Note  Patient Details  Name: Charles Blankenship MRN: [id number] Date of Birth: 1961-04-27  Subjective/Objective:   57 y.o. male s/p R  I&D infection right calcaneus 10/28/17.  PTA, pt independent, lives at home with spouse.                  Action/Plan: PT Recommending no OP follow up.  Will follow for home needs as pt progresses.   Expected Discharge Date:                  Expected Discharge Plan:  [place]  In-House Referral:     Discharge planning Services  CM Consult  Post Acute Care Choice:  Home Health Choice offered to:     DME Arranged:  IV pump/equipment DME Agency:  [place]:  RN [facility] Agency:  [place]  Status of Service:  In process, will continue to follow  If discussed at Long Length of Stay Meetings, dates discussed:    Additional Comments:  10/30/17 J. Lucillia Corson, RN, BSN Met with pt to discuss home IV antibiotic therapy.  Pt has recently done home abx infusions via PICC line.  He is disappointed that he needs them again.  Wife available to assist with home infusion therapy.  Pt prefers [facility] for home infusion needs.  Notified Carolynn Sayers [facility] infusion coordinator of need for home IV antibiotics and reinforcement of teaching. Notified Dan with [facility] of need for [facility] to assist with infusions and PICC line care in the home.  PICC line insertion pending.     Reinaldo Raddle, RN, BSN  Trauma/Neuro ICU Case Manager [phone]

## 2017-10-30 NOTE — Progress Notes (Signed)
PHARMACY CONSULT NOTE FOR:  OUTPATIENT  PARENTERAL ANTIBIOTIC THERAPY (OPAT)  Indication: MRSA osteomyelitis of right heel Regimen: Vancomycin 750mg  IV Q12h End date: 12/09/17  Will plan to check vanc trough if still here over the weekend  IV antibiotic discharge orders are pended. To discharging provider:  please sign these orders via discharge navigator,  Select New Orders & click on the button choice - Manage This Unsigned Work.     Thank you for allowing pharmacy to be a part of this patient's care.  Armandina StammerBATCHELDER,Hanya Guerin J 10/30/2017, 2:42 PM
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
## 2017-10-30 NOTE — Progress Notes (Signed)
Discussed PICC placement with patient including risks, benefits, and alternatives. Requested waiting until AM of 10-31-17. Primary RN notified.

## 2017-10-30 NOTE — Progress Notes (Signed)
Physical Therapy Treatment Patient Details Name: Charles Blankenship MRN: [id number] DOB: 22-Dec-1960 Today's Date: 10/30/2017    History of Present Illness 57 y.o. male s/p R  I&D infection right calcaneus 10/28/17.  PMH includes:  PTSD,  R THA, ORIF calcneous fx.     PT Comments    Patient doing well today with therapy. Increased ambulation distances and independence on knee walker. Pt demonstrating safe stability during transfers with RW with adherence to NWB status.  Pt denies any concerns for safe return home once medically cleared for d/c.  Follow Up Recommendations  No PT follow up;Supervision for mobility/OOB     Equipment Recommendations  None recommended by PT    Recommendations for Other Services       Precautions / Restrictions Precautions Precautions: Fall Restrictions Weight Bearing Restrictions: Yes RLE Weight Bearing: Non weight bearing    Mobility  Bed Mobility Overal bed mobility: Modified Independent                Transfers Overall transfer level: Needs assistance Equipment used: Rolling walker (2 wheeled) Transfers: Sit to/from [employer] Sit to Stand: Supervision         General transfer comment: supervision for safety, providing cues for hand placement and NWB status.   Ambulation/Gait Ambulation/Gait assistance: Supervision Ambulation Distance (Feet): 200 Feet Assistive device: Rolling walker (2 wheeled)(Knee Scooter) Gait Pattern/deviations: Step-to pattern Gait velocity: decreased   General Gait Details: Patient using knee scooter with ease today, just managing lines for him. also showing good balance and stability during transfers with RW. Adherence to NWB status.   Stairs            Wheelchair Mobility    Modified Rankin (Stroke Patients Only)       Balance Overall balance assessment: Mild deficits observed, not formally tested                                          Cognition  Arousal/Alertness: Awake/alert Behavior During Therapy: WFL for tasks assessed/performed Overall Cognitive Status: Within Functional Limits for tasks assessed                                        Exercises      General Comments        Pertinent Vitals/Pain Pain Assessment: 0-10 Pain Score: 7  Pain Location: R heel Pain Descriptors / Indicators: Throbbing;Operative site guarding;Grimacing Pain Intervention(s): Limited activity within patient's tolerance;Monitored during session;Premedicated before session;Repositioned    Home Living                      Prior Function            PT Goals (current goals can now be found in the care plan section) Acute Rehab PT Goals Patient Stated Goal: go home PT Goal Formulation: With patient Time For Goal Achievement: 11/05/17 Potential to Achieve Goals: Good    Frequency    Min 5X/week      PT Plan Current plan remains appropriate    Co-evaluation              AM-PAC PT "6 Clicks" Daily Activity  Outcome Measure  Difficulty turning over in bed (including adjusting bedclothes, sheets and blankets)?: A Little Difficulty moving from lying  on back to sitting on the side of the bed? : A Little Difficulty sitting down on and standing up from a chair with arms (e.g., wheelchair, bedside commode, etc,.)?: A Little Help needed moving to and from a bed to chair (including a wheelchair)?: A Little Help needed walking in hospital room?: A Little Help needed climbing 3-5 steps with a railing? : A Little 6 Click Score: 18    End of Session Equipment Utilized During Treatment: Gait belt Activity Tolerance: Patient tolerated treatment well Patient left: in chair;with call bell/phone within reach Nurse Communication: Mobility status PT Visit Diagnosis: Unsteadiness on feet (R26.81);Other abnormalities of gait and mobility (R26.89);Difficulty in walking, not elsewhere classified (R26.2);Pain Pain -  Right/Left: Right Pain - part of body: Ankle and joints of foot     Time: 4098-1191 PT Time Calculation (min) (ACUTE ONLY): 26 min  Charges:  $Gait Training: 8-22 mins $Therapeutic Activity: 8-22 mins                    G Codes:      Etta Grandchild, PT, DPT Acute Rehab Services Pager: [phone]     Etta Grandchild 10/30/2017, 9:38 AM

## 2017-10-30 NOTE — Progress Notes (Signed)
[facility]  New pt with [facility] this admission though pt has had our services in the past.  [facility] for home [facility]X at DC.  [facility] is prepared for weekend DC. [facility]   If patient discharges after hours, please call [phone].   Charles Blankenship 10/30/2017, 3:16 PM

## 2017-10-30 NOTE — Progress Notes (Signed)
Pharmacy Antibiotic Note  Charles Blankenship is a 57 y.o. male admitted on 10/28/2017 with R foot osteo.  Pharmacy has been consulted for vancomycin dosing.  Normalized CrCl ~65-6570ml/min.   Plan: Give vancomycin 2g IV x 1, then start vancomycin 750mg  IV Q12h Monitor clinical picture, renal function, VT prn F/U C&S, abx deescalation / LOT  Height: 6' (182.9 cm) Weight: 230 lb (104.3 kg) IBW/kg (Calculated) : 77.6  Temp (24hrs), Avg:98.2 F (36.8 C), Min:98 F (36.7 C), Max:98.4 F (36.9 C)  Recent Labs  Lab 10/28/17 1356  WBC 8.1  CREATININE 1.20    Estimated Creatinine Clearance: 84.8 mL/min (by C-G formula based on SCr of 1.2 mg/dL).    No Known Allergies  Antimicrobials this admission: Vancomycin 3/29 >>   Dose adjustments this admission: n/a  Microbiology results: 3/27 Tissue Cx: MRSA 3/27 Wound Cx: sent  Thank you for allowing pharmacy to be a part of this patient's care.  Armandina StammerBATCHELDER,Rayah Fines J 10/30/2017 12:16 PM

## 2017-10-31 MED ORDER — METHOCARBAMOL 500 MG PO TABS: 500.0000 mg | ORAL_TABLET | Freq: Four times a day (QID) | ORAL | 0 refills | Status: AC | PRN

## 2017-10-31 MED ORDER — CEFAZOLIN SODIUM-DEXTROSE 1-4 GM/50ML-% IV SOLN: 1.0000 g | Freq: Three times a day (TID) | INTRAVENOUS | 30 refills | Status: DC

## 2017-10-31 MED ORDER — HEPARIN SOD (PORK) LOCK FLUSH 100 UNIT/ML IV SOLN
250.0000 [IU] | INTRAVENOUS | Status: DC | PRN
  Administered 2017-10-31: 250 [IU]
  Filled 2017-10-31 (×2): qty 3

## 2017-10-31 MED ORDER — SODIUM CHLORIDE 0.9% FLUSH: 10.0000 mL | Freq: Two times a day (BID) | INTRAVENOUS | Status: DC

## 2017-10-31 MED ORDER — VANCOMYCIN HCL IN DEXTROSE 750-5 MG/150ML-% IV SOLN: 750.0000 mg | Freq: Two times a day (BID) | INTRAVENOUS | 30 refills | Status: DC

## 2017-10-31 MED ORDER — DOCUSATE SODIUM 100 MG PO CAPS: 100.0000 mg | ORAL_CAPSULE | Freq: Two times a day (BID) | ORAL | 0 refills | Status: AC

## 2017-10-31 MED ORDER — VANCOMYCIN IV (FOR PTA / DISCHARGE USE ONLY): 750.0000 mg | Freq: Two times a day (BID) | INTRAVENOUS | 0 refills | Status: DC

## 2017-10-31 MED ORDER — SODIUM CHLORIDE 0.9% FLUSH: 10.0000 mL | INTRAVENOUS | Status: DC | PRN

## 2017-10-31 MED ORDER — OXYCODONE-ACETAMINOPHEN 5-325 MG PO TABS: 1.0000 | ORAL_TABLET | ORAL | 0 refills | Status: DC | PRN

## 2017-10-31 MED ORDER — HEPARIN SOD (PORK) LOCK FLUSH 100 UNIT/ML IV SOLN
250.0000 [IU] | Freq: Every day | INTRAVENOUS | Status: DC
  Filled 2017-10-31: qty 2.5

## 2017-10-31 NOTE — Progress Notes (Addendum)
Pt stable picc line in place Dc today if [facility] for iv abx can be done rx on chart Home with priovena

## 2017-10-31 NOTE — Progress Notes (Signed)
Peripherally Inserted Central Catheter/Midline Placement  The IV Nurse has discussed with the patient and/or persons authorized to consent for the patient, the purpose of this procedure and the potential benefits and risks involved with this procedure.  The benefits include less needle sticks, lab draws from the catheter, and the patient may be discharged home with the catheter. Risks include, but not limited to, infection, bleeding, blood clot (thrombus formation), and puncture of an artery; nerve damage and irregular heartbeat and possibility to perform a PICC exchange if needed/ordered by physician.  Alternatives to this procedure were also discussed.  Bard Power PICC patient education guide, fact sheet on infection prevention and patient information card has been provided to patient /or left at bedside.    PICC/Midline Placement Documentation  PICC Single Lumen 06/21/17 PICC Right Basilic 43 cm 0 cm (Active)     PICC Single Lumen 10/31/17 PICC Right Brachial 43 cm 0 cm (Active)  Indication for Insertion or Continuance of Line Home intravenous therapies (PICC only) 10/31/2017  9:41 AM  Exposed Catheter (cm) 0 cm 10/31/2017  9:41 AM  Site Assessment Clean;Dry;Intact 10/31/2017  9:41 AM  Line Status Flushed;Saline locked;Blood return noted 10/31/2017  9:41 AM  Dressing Type Transparent 10/31/2017  9:41 AM  Dressing Status Clean;Dry;Intact;Antimicrobial disc in place 10/31/2017  9:41 AM  Line Care Connections checked and tightened 10/31/2017  9:41 AM  Line Adjustment (NICU/IV Team Only) No 10/31/2017  9:41 AM  Dressing Intervention New dressing 10/31/2017  9:41 AM  Dressing Change Due 11/07/17 10/31/2017  9:41 AM       Elliot Dallyiggs, Makeisha Jentsch Wright 10/31/2017, 9:43 AM

## 2017-11-02 ENCOUNTER — Telehealth (INDEPENDENT_AMBULATORY_CARE_PROVIDER_SITE_OTHER): Payer: Self-pay | Admitting: Orthopedic Surgery

## 2017-11-02 LAB — AEROBIC/ANAEROBIC CULTURE (SURGICAL/DEEP WOUND)

## 2017-11-02 LAB — AEROBIC/ANAEROBIC CULTURE W GRAM STAIN (SURGICAL/DEEP WOUND)

## 2017-11-02 NOTE — Telephone Encounter (Signed)
Marchelle Folksmanda from [facility] called asking for verbal orders on patients wound care, especially having to do with his disposable wound vac. CB # [phone]

## 2017-11-02 NOTE — Telephone Encounter (Signed)
I called and HHn states that the pt was wanting to remove the vac himself today. I advised that he had this placed after surgery on Wednesday and that it is a 7 day vac and should leave intact as long as it is functioning well intil Wednesday and follow up in the office for a nurse only visit. I called and sw the pt to advise him of this and he voiced understanding wanted to wait to make an appt until after his wife came back home. She will call and make appt for this Wednesday morning nurse only visit.

## 2017-11-03 ENCOUNTER — Encounter: Payer: Self-pay | Admitting: Internal Medicine

## 2017-11-04 ENCOUNTER — Encounter (INDEPENDENT_AMBULATORY_CARE_PROVIDER_SITE_OTHER): Payer: Self-pay

## 2017-11-04 ENCOUNTER — Ambulatory Visit (INDEPENDENT_AMBULATORY_CARE_PROVIDER_SITE_OTHER): Payer: Medicaid Other

## 2017-11-04 VITALS — Ht 72.0 in | Wt 230.0 lb

## 2017-11-04 DIAGNOSIS — M869 Osteomyelitis, unspecified: Secondary | ICD-10-CM

## 2017-11-04 NOTE — Progress Notes (Signed)
Patient is 1 week s/p a right calcaneal I&D with ABX bead placement and prevena vac application. He is ambulating with crutches partial weightbearing with a post op shoe. Pt wound vac is on but does not appear to be suctioning. The surgical dressing and vac are been removed and there is quite a bit of brown drainage underneath the dressing. The skin is macerated. The incision is slightly open with abx beads visible at the proximal end of the incision. Advised the pt to gently wash around the area with dial soap and water. ABD and ace bandage have been applied. Advised the pt to change this dressing daily and gave additional supplies. Advised the pt that he needs to elevate his foot higher than his heart and that he should be non weight bearing. Pt asked about a knee scooter and I advised that he can rent this [facility]. He will stop by there today to see if he is able to do this. Pt will call with any changes otherwise he will follow up in the office in one week with Dr. Lajoyce Cornersuda.   Autumn Forrest, RMA, [employer]

## 2017-11-05 ENCOUNTER — Telehealth (INDEPENDENT_AMBULATORY_CARE_PROVIDER_SITE_OTHER): Payer: Self-pay | Admitting: Orthopedic Surgery

## 2017-11-05 ENCOUNTER — Encounter: Payer: Self-pay | Admitting: Internal Medicine

## 2017-11-05 NOTE — Telephone Encounter (Signed)
Sarah from [facility] called asking to verify wound care orders. CB # [phone]

## 2017-11-05 NOTE — Telephone Encounter (Signed)
Called to advise wash with dial soap and water pat dry and apply dry dressing every day. Advised non weight bearing and follow up in the office in one week.

## 2017-11-08 ENCOUNTER — Other Ambulatory Visit (HOSPITAL_COMMUNITY)
Admission: RE | Admit: 2017-11-08 | Discharge: 2017-11-08 | Disposition: A | Discharge status: Home / Self Care | Payer: Medicaid Other | Source: Other Acute Inpatient Hospital | Attending: Internal Medicine | Admitting: Internal Medicine

## 2017-11-08 DIAGNOSIS — Z5181 Encounter for therapeutic drug level monitoring: Secondary | ICD-10-CM | POA: Insufficient documentation

## 2017-11-08 LAB — CBC WITH DIFFERENTIAL/PLATELET
BASOS ABS: 0 10*3/uL (ref 0.0–0.1)
BASOS PCT: 1 %
EOS ABS: 0.4 10*3/uL (ref 0.0–0.7)
EOS PCT: 6 %
HCT: 24.4 % — ABNORMAL LOW (ref 39.0–52.0)
HEMOGLOBIN: 7.7 g/dL — AB (ref 13.0–17.0)
Lymphocytes Relative: 34 %
Lymphs Abs: 2 10*3/uL (ref 0.7–4.0)
MCH: 24.1 pg — ABNORMAL LOW (ref 26.0–34.0)
MCHC: 31.6 g/dL (ref 30.0–36.0)
MCV: 76.5 fL — ABNORMAL LOW (ref 78.0–100.0)
Monocytes Absolute: 0.6 10*3/uL (ref 0.1–1.0)
Monocytes Relative: 10 %
NEUTROS PCT: 49 %
Neutro Abs: 3 10*3/uL (ref 1.7–7.7)
PLATELETS: 241 10*3/uL (ref 150–400)
RBC: 3.19 MIL/uL — AB (ref 4.22–5.81)
RDW: 18.4 % — ABNORMAL HIGH (ref 11.5–15.5)
WBC: 6 10*3/uL (ref 4.0–10.5)

## 2017-11-08 LAB — BASIC METABOLIC PANEL
Anion gap: 10 (ref 5–15)
BUN: 10 mg/dL (ref 6–20)
CALCIUM: 8.9 mg/dL (ref 8.9–10.3)
CHLORIDE: 107 mmol/L (ref 101–111)
CO2: 23 mmol/L (ref 22–32)
CREATININE: 1.11 mg/dL (ref 0.61–1.24)
Glucose, Bld: 146 mg/dL — ABNORMAL HIGH (ref 65–99)
Potassium: 3.3 mmol/L — ABNORMAL LOW (ref 3.5–5.1)
SODIUM: 140 mmol/L (ref 135–145)

## 2017-11-08 LAB — VANCOMYCIN, TROUGH: VANCOMYCIN TR: 8 ug/mL — AB (ref 15–20)

## 2017-11-09 ENCOUNTER — Ambulatory Visit (INDEPENDENT_AMBULATORY_CARE_PROVIDER_SITE_OTHER): Payer: Medicaid Other | Admitting: Orthopedic Surgery

## 2017-11-09 ENCOUNTER — Encounter (INDEPENDENT_AMBULATORY_CARE_PROVIDER_SITE_OTHER): Payer: Self-pay | Admitting: Orthopedic Surgery

## 2017-11-09 VITALS — Ht 72.0 in | Wt 230.0 lb

## 2017-11-09 DIAGNOSIS — M869 Osteomyelitis, unspecified: Secondary | ICD-10-CM

## 2017-11-09 MED ORDER — OXYCODONE-ACETAMINOPHEN 5-325 MG PO TABS: 1.0000 | ORAL_TABLET | ORAL | 0 refills | Status: DC | PRN

## 2017-11-09 NOTE — Discharge Summary (Signed)
Discharge Diagnoses:  Principal Problem:   MRSA infection Active Problems:   Osteomyelitis of right foot ([place])   Foot abscess, right   Surgeries: Procedure(s): IRRIGATION AND DEBRIDEMENT RIGHT CALCANEOUS, PLACE ANTIBIOTIC BEADS on 10/28/2017    Consultants:   Discharged Condition: Improved  Hospital Course: Charles Blankenship is an 57 y.o. male who was admitted 10/28/2017 with a chief complaint of osteomyelitis right calcaneus, with a final diagnosis of Osteomyelitis Right Calcaneous.  Patient was brought to the operating room on 10/28/2017 and underwent Procedure(s): IRRIGATION AND DEBRIDEMENT RIGHT CALCANEOUS, PLACE ANTIBIOTIC BEADS.    Patient was given perioperative antibiotics:  Anti-infectives (From admission, onward)   Start     Dose/Rate Route Frequency Ordered Stop   10/31/17 0100  vancomycin (VANCOCIN) IVPB 750 mg/150 ml premix  Status:  Discontinued     750 mg 150 mL/hr over 60 Minutes Intravenous Every 12 hours 10/30/17 1233 10/31/17 1835   10/31/17 0000  vancomycin IVPB     750 mg Intravenous Every 12 hours 10/31/17 0946     10/31/17 0000  ceFAZolin (ANCEF) 1-4 GM/50ML-% SOLN  Status:  Discontinued     1 g 100 mL/hr over 30 Minutes Intravenous Every 8 hours 10/31/17 0946 10/31/17    10/31/17 0000  Vancomycin (VANCOCIN) 750-5 MG/150ML-% SOLN  Status:  Discontinued     750 mg 150 mL/hr over 60 Minutes Intravenous Every 12 hours 10/31/17 0946 10/31/17    10/30/17 1245  vancomycin (VANCOCIN) 2,000 mg in sodium chloride 0.9 % 500 mL IVPB     2,000 mg 250 mL/hr over 120 Minutes Intravenous  Once 10/30/17 1216 10/30/17 1631   10/29/17 0600  ceFAZolin (ANCEF) IVPB 2g/100 mL premix  Status:  Discontinued     2 g 200 mL/hr over 30 Minutes Intravenous On call to O.R. 10/28/17 1321 10/28/17 1705   10/28/17 1800  ceFAZolin (ANCEF) IVPB 1 g/50 mL premix     1 g 100 mL/hr over 30 Minutes Intravenous Every 8 hours 10/28/17 1736 10/31/17 1004   10/28/17 1455  vancomycin (VANCOCIN)  powder  Status:  Discontinued       As needed 10/28/17 1455 10/28/17 1559   10/28/17 1454  gentamicin (GARAMYCIN) injection  Status:  Discontinued       As needed 10/28/17 1454 10/28/17 1559    .  Patient was given sequential compression devices, early ambulation, and aspirin for DVT prophylaxis.  Recent vital signs: No data found..  Recent laboratory studies: No results found.  Discharge Medications:   Allergies as of 10/31/2017   No Known Allergies     Medication List    STOP taking these medications   oxyCODONE-acetaminophen 5-325 MG tablet Commonly known as:  PERCOCET/ROXICET     TAKE these medications   allopurinol 100 MG tablet Commonly known as:  ZYLOPRIM Take 1 tablet (100 mg total) by mouth 2 (two) times daily.   aspirin EC 81 MG tablet Take 81 mg by mouth daily.   Colchicine 0.6 MG Caps Take 1 capsule by mouth twice daily as needed for gout pain. Discontinue once pain resolves. Restart in event of acute gout flare up.   Colchicine 0.6 MG Caps Commonly known as:  MITIGARE Take 1 capsule by mouth twice daily as needed for gout pain. Discontinue once pain resolves. Restart in event of acute gout flare up.   cyclobenzaprine 10 MG tablet Commonly known as:  FLEXERIL Take 10 mg by mouth at bedtime.   diphenhydrAMINE 25 MG tablet Commonly known as:  BENADRYL Take 25 mg by mouth 2 (two) times daily.   docusate sodium 100 MG capsule Commonly known as:  COLACE Take 1 capsule (100 mg total) by mouth 2 (two) times daily.   doxepin 25 MG capsule Commonly known as:  SINEQUAN Take 25 mg by mouth at bedtime.   gabapentin 300 MG capsule Commonly known as:  NEURONTIN Take 200 mg at bedtime by mouth.   ibuprofen 800 MG tablet Commonly known as:  ADVIL,MOTRIN Take 800 mg by mouth every 8 (eight) hours as needed for headache or moderate pain.   lisinopril-hydrochlorothiazide 20-12.5 MG tablet Commonly known as:  PRINZIDE,ZESTORETIC Take 1 tablet by mouth daily.    methocarbamol 500 MG tablet Commonly known as:  ROBAXIN Take 1 tablet (500 mg total) by mouth every 6 (six) hours as needed for muscle spasms.   multivitamin with minerals Tabs tablet Take 1 tablet by mouth daily.   mupirocin ointment 2 % Commonly known as:  BACTROBAN Apply 1 application topically 2 (two) times daily. Apply to the affected area 2 times a day   pravastatin 20 MG tablet Commonly known as:  PRAVACHOL Take 20 mg by mouth at bedtime.   sildenafil 20 MG tablet Commonly known as:  REVATIO TAKE 40 MG BY MOUTH APPROXIMATELY 30 MINUTES PRIOR TO SEXUAL ACTIVITY AS NEEDED   vancomycin IVPB Inject 750 mg into the vein every 12 (twelve) hours. Indication: MRSA osteomyelitis of right heel Last Day of Therapy: 12/09/2017 Labs - 'Sunday/Monday:  CBC/D, BMP, and vancomycin trough. Labs - Thursday:  BMP and vancomycin trough Labs - Every other week:  ESR and CRP   vitamin B-12 500 MCG tablet Commonly known as:  CYANOCOBALAMIN Take 500 mcg by mouth daily.            Home Infusion Instuctions  (From admission, onward)        Start     Ordered   10/31/17 0000  Home infusion instructions [facility] May follow [facility] Dosing Protocol; May administer Cathflo as needed to maintain patency of vascular access device.; Flushing of vascular access device: per [facility] Protocol: 0.9% NaCl pre/post medica...    Question Answer Comment  Instructions May follow [facility] Dosing Protocol   Instructions May administer Cathflo as needed to maintain patency of vascular access device.   Instructions Flushing of vascular access device: per [facility] Protocol: 0.9% NaCl pre/post medication administration and prn patency; Heparin 100 u/ml, 5ml for implanted ports and Heparin 10u/ml, 5ml for all other central venous catheters.   Instructions May follow [facility] Anaphylaxis Protocol for First Dose Administration in the home: 0.9% NaCl at 25-50 ml/hr to maintain IV access for protocol meds.  Epinephrine 0.3 ml IV/IM PRN and Benadryl 25-50 IV/IM PRN s/s of anaphylaxis.   Instructions [facility] Infusion Coordinator (RN) to assist per patient IV care needs in the home PRN.      03' /30/19 0946      Diagnostic Studies: Xr Os Calcis Right  Result Date: 10/26/2017 2 view radiographs of the right calcaneus shows an area of lytic bone destruction over the lateral border of the calcaneus.  The medial column is well-healed and out to length.  [place] Ekg Site Rite  Result Date: 10/30/2017 If Site Rite image not attached, placement could not be confirmed due to current cardiac rhythm.   Patient benefited maximally from their hospital stay and there were no complications.     Disposition:  Discharge Instructions    Call MD /  Call 911   Complete by:  As directed    If you experience chest pain or shortness of breath, CALL 911 and be transported to the hospital emergency room.  If you develope a fever above 101 F, pus (white drainage) or increased drainage or redness at the wound, or calf pain, call your surgeon's office.   Constipation Prevention   Complete by:  As directed    Drink plenty of fluids.  Prune juice may be helpful.  You may use a stool softener, such as Colace (over the counter) 100 mg twice a day.  Use MiraLax (over the counter) for constipation as needed.   Diet - low sodium heart healthy   Complete by:  As directed    Discharge instructions   Complete by:  As directed    Home infusion instructions [facility] May follow [place] Dosing Protocol; May administer Cathflo as needed to maintain patency of vascular access device.; Flushing of vascular access device: per [facility] Protocol: 0.9% NaCl pre/post medica...   Complete by:  As directed    Instructions:  May follow [place] Dosing Protocol   Instructions:  May administer Cathflo as needed to maintain patency of vascular access device.   Instructions:  Flushing of vascular access device: per [facility]  Protocol: 0.9% NaCl pre/post medication administration and prn patency; Heparin 100 u/ml, 51m for implanted ports and Heparin 10u/ml, 549mfor all other central venous catheters.   Instructions:  May follow [facility] Anaphylaxis Protocol for First Dose Administration in the home: 0.9% NaCl at 25-50 ml/hr to maintain IV access for protocol meds. Epinephrine 0.3 ml IV/IM PRN and Benadryl 25-50 IV/IM PRN s/s of anaphylaxis.   Instructions:  [facility] Coordinator (RN) to assist per patient IV care needs in the home PRN.   Increase activity slowly as tolerated   Complete by:  As directed      Follow-up Information    [facility]-Home Follow up.   Specialty:  [place]:  HHRN to follow up with you at home for IV antibiotics and PICC line care Contact information: [address] [place] [phone]          Signed: MaNewt Minion/03/2018, 2:22 PM
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
## 2017-11-12 ENCOUNTER — Encounter: Payer: Self-pay | Admitting: Internal Medicine

## 2017-11-13 ENCOUNTER — Other Ambulatory Visit: Payer: Self-pay | Admitting: Pharmacist

## 2017-11-16 ENCOUNTER — Ambulatory Visit (INDEPENDENT_AMBULATORY_CARE_PROVIDER_SITE_OTHER): Payer: Medicaid Other | Admitting: Orthopedic Surgery

## 2017-11-16 ENCOUNTER — Encounter (INDEPENDENT_AMBULATORY_CARE_PROVIDER_SITE_OTHER): Payer: Self-pay | Admitting: Orthopedic Surgery

## 2017-11-16 DIAGNOSIS — M86171 Other acute osteomyelitis, right ankle and foot: Secondary | ICD-10-CM

## 2017-11-16 MED ORDER — OXYCODONE-ACETAMINOPHEN 5-325 MG PO TABS: 1.0000 | ORAL_TABLET | ORAL | 0 refills | Status: DC | PRN
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
## 2017-11-19 ENCOUNTER — Other Ambulatory Visit: Payer: Self-pay | Admitting: Pharmacist

## 2017-11-23 ENCOUNTER — Encounter (INDEPENDENT_AMBULATORY_CARE_PROVIDER_SITE_OTHER): Payer: Self-pay | Admitting: Orthopedic Surgery

## 2017-11-23 ENCOUNTER — Ambulatory Visit (INDEPENDENT_AMBULATORY_CARE_PROVIDER_SITE_OTHER): Payer: Medicaid Other | Admitting: Orthopedic Surgery

## 2017-11-23 DIAGNOSIS — M86171 Other acute osteomyelitis, right ankle and foot: Secondary | ICD-10-CM

## 2017-11-23 MED ORDER — OXYCODONE-ACETAMINOPHEN 5-325 MG PO TABS: 1.0000 | ORAL_TABLET | Freq: Three times a day (TID) | ORAL | 0 refills | Status: DC | PRN
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
## 2017-11-25 ENCOUNTER — Telehealth: Payer: Self-pay | Admitting: Pharmacist Clinician (PhC)/ Clinical Pharmacy Specialist

## 2017-11-25 NOTE — Telephone Encounter (Signed)
[facility] called to let us know that Charles Blankenship's CPK is about ~1200. Talked to him directly today. He has no muscle weakness or fatigue. It's approximately 5x ULN but without symptoms. Asked them to repeat tomorrow. If it's higher (10x ULN) we will change therapy or he develops symptoms. Asked to call back and let us know if he develops symptoms.

## 2017-11-26 ENCOUNTER — Telehealth (INDEPENDENT_AMBULATORY_CARE_PROVIDER_SITE_OTHER): Payer: Self-pay | Admitting: Orthopedic Surgery

## 2017-11-26 NOTE — Telephone Encounter (Signed)
[facility]  [phone]  Ext [phone]    Form needed   Plan of care

## 2017-11-27 ENCOUNTER — Telehealth: Payer: Self-pay | Admitting: Pharmacist Clinician (PhC)/ Clinical Pharmacy Specialist

## 2017-11-27 NOTE — Telephone Encounter (Signed)
CPK has dropped down from from around 1250>>550 now. Will continue daptomycin.

## 2017-11-30 ENCOUNTER — Encounter (INDEPENDENT_AMBULATORY_CARE_PROVIDER_SITE_OTHER): Payer: Self-pay | Admitting: Orthopedic Surgery

## 2017-11-30 ENCOUNTER — Encounter: Payer: Self-pay | Admitting: Internal Medicine

## 2017-11-30 ENCOUNTER — Ambulatory Visit (INDEPENDENT_AMBULATORY_CARE_PROVIDER_SITE_OTHER): Payer: Medicaid Other | Admitting: Orthopedic Surgery

## 2017-11-30 DIAGNOSIS — M86171 Other acute osteomyelitis, right ankle and foot: Secondary | ICD-10-CM

## 2017-11-30 MED ORDER — OXYCODONE-ACETAMINOPHEN 5-325 MG PO TABS: 1.0000 | ORAL_TABLET | Freq: Four times a day (QID) | ORAL | 0 refills | Status: DC | PRN
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
## 2017-12-04 ENCOUNTER — Other Ambulatory Visit: Payer: Self-pay | Admitting: Pharmacist

## 2017-12-07 ENCOUNTER — Encounter (INDEPENDENT_AMBULATORY_CARE_PROVIDER_SITE_OTHER): Payer: Self-pay | Admitting: Orthopedic Surgery

## 2017-12-07 ENCOUNTER — Encounter: Payer: Self-pay | Admitting: Internal Medicine

## 2017-12-07 ENCOUNTER — Ambulatory Visit (INDEPENDENT_AMBULATORY_CARE_PROVIDER_SITE_OTHER): Payer: Medicaid Other | Admitting: Orthopedic Surgery

## 2017-12-07 DIAGNOSIS — M86171 Other acute osteomyelitis, right ankle and foot: Secondary | ICD-10-CM

## 2017-12-07 MED ORDER — OXYCODONE-ACETAMINOPHEN 5-325 MG PO TABS: 1.0000 | ORAL_TABLET | Freq: Three times a day (TID) | ORAL | 0 refills | Status: DC | PRN

## 2017-12-07 NOTE — Addendum Note (Signed)
Addended by: Aldean Baker on: 12/07/2017 02:36 PM   Modules accepted: Orders
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
## 2017-12-08 ENCOUNTER — Ambulatory Visit (INDEPENDENT_AMBULATORY_CARE_PROVIDER_SITE_OTHER): Payer: Medicaid Other | Admitting: Internal Medicine

## 2017-12-08 ENCOUNTER — Telehealth: Payer: Self-pay | Admitting: Pharmacist

## 2017-12-08 ENCOUNTER — Encounter: Payer: Self-pay | Admitting: Internal Medicine

## 2017-12-08 DIAGNOSIS — M86471 Chronic osteomyelitis with draining sinus, right ankle and foot: Secondary | ICD-10-CM
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
## 2017-12-08 NOTE — Assessment & Plan Note (Signed)
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  I will stop daptomycin and have his PICC removed.  He will follow-up here in 4 to 6 weeks

## 2017-12-08 NOTE — Telephone Encounter (Signed)
Called and gave verbal order to Coretta at [facility] to pull patient's PICC line whenever they can get to his house per Dr. Orvan Falconer. Coretta verabalized understanding.

## 2017-12-21 ENCOUNTER — Ambulatory Visit (INDEPENDENT_AMBULATORY_CARE_PROVIDER_SITE_OTHER): Payer: Medicaid Other | Admitting: Orthopedic Surgery

## 2017-12-21 ENCOUNTER — Encounter (INDEPENDENT_AMBULATORY_CARE_PROVIDER_SITE_OTHER): Payer: Self-pay | Admitting: Orthopedic Surgery

## 2017-12-21 VITALS — Ht 72.0 in | Wt 228.0 lb

## 2017-12-21 DIAGNOSIS — M86171 Other acute osteomyelitis, right ankle and foot: Secondary | ICD-10-CM
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
## 2018-01-04 ENCOUNTER — Ambulatory Visit (INDEPENDENT_AMBULATORY_CARE_PROVIDER_SITE_OTHER): Payer: Medicaid Other | Admitting: Orthopedic Surgery

## 2018-01-04 ENCOUNTER — Encounter (INDEPENDENT_AMBULATORY_CARE_PROVIDER_SITE_OTHER): Payer: Self-pay | Admitting: Orthopedic Surgery

## 2018-01-04 VITALS — Ht 72.0 in | Wt 228.0 lb

## 2018-01-04 DIAGNOSIS — M86171 Other acute osteomyelitis, right ankle and foot: Secondary | ICD-10-CM
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
## 2018-01-18 ENCOUNTER — Encounter (INDEPENDENT_AMBULATORY_CARE_PROVIDER_SITE_OTHER): Payer: Self-pay | Admitting: Orthopedic Surgery

## 2018-01-18 ENCOUNTER — Ambulatory Visit (INDEPENDENT_AMBULATORY_CARE_PROVIDER_SITE_OTHER): Payer: Medicaid Other | Admitting: Orthopedic Surgery

## 2018-01-18 DIAGNOSIS — M86171 Other acute osteomyelitis, right ankle and foot: Secondary | ICD-10-CM

## 2018-01-18 MED ORDER — OXYCODONE-ACETAMINOPHEN 5-325 MG PO TABS: 1.0000 | ORAL_TABLET | Freq: Three times a day (TID) | ORAL | 0 refills | Status: DC | PRN
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
## 2018-01-26 ENCOUNTER — Other Ambulatory Visit: Payer: Self-pay | Admitting: *Deleted

## 2018-01-26 ENCOUNTER — Encounter: Payer: Self-pay | Admitting: Internal Medicine

## 2018-01-26 ENCOUNTER — Ambulatory Visit (INDEPENDENT_AMBULATORY_CARE_PROVIDER_SITE_OTHER): Payer: Medicaid Other | Admitting: Internal Medicine

## 2018-01-26 DIAGNOSIS — M86471 Chronic osteomyelitis with draining sinus, right ankle and foot: Secondary | ICD-10-CM

## 2018-01-26 MED ORDER — DOXYCYCLINE HYCLATE 100 MG PO TABS: 100.0000 mg | ORAL_TABLET | Freq: Two times a day (BID) | ORAL | 1 refills | Status: DC

## 2018-01-26 NOTE — Assessment & Plan Note (Signed)
I am concerned that he has continued wound drainage.  He has not noted any antibiotic beads discharging from the wound for about 1 month so I doubt that that is the cause of the drainage.  I will repeat blood work today.  We talked about management options including going back on IV antibiotics versus oral antibiotic therapy.  He strongly prefers the latter.  I will start him on doxycycline and see him back in 1 month.
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
## 2018-01-27 LAB — CBC
HCT: 27.6 % — ABNORMAL LOW (ref 38.5–50.0)
Hemoglobin: 8.9 g/dL — ABNORMAL LOW (ref 13.2–17.1)
MCH: 23.4 pg — ABNORMAL LOW (ref 27.0–33.0)
MCHC: 32.2 g/dL (ref 32.0–36.0)
MCV: 72.4 fL — AB (ref 80.0–100.0)
MPV: 11 fL (ref 7.5–12.5)
PLATELETS: 310 10*3/uL (ref 140–400)
RBC: 3.81 10*6/uL — ABNORMAL LOW (ref 4.20–5.80)
RDW: 17.9 % — AB (ref 11.0–15.0)
WBC: 8.4 10*3/uL (ref 3.8–10.8)

## 2018-01-27 LAB — SEDIMENTATION RATE: SED RATE: 41 mm/h — AB (ref 0–20)

## 2018-01-27 LAB — BASIC METABOLIC PANEL
BUN/Creatinine Ratio: 7 (calc) (ref 6–22)
BUN: 11 mg/dL (ref 7–25)
CHLORIDE: 102 mmol/L (ref 98–110)
CO2: 23 mmol/L (ref 20–32)
CREATININE: 1.59 mg/dL — AB (ref 0.70–1.33)
Calcium: 9.6 mg/dL (ref 8.6–10.3)
Glucose, Bld: 146 mg/dL — ABNORMAL HIGH (ref 65–99)
Potassium: 3.5 mmol/L (ref 3.5–5.3)
Sodium: 136 mmol/L (ref 135–146)

## 2018-01-27 LAB — C-REACTIVE PROTEIN: CRP: 5.1 mg/L (ref <8.0)

## 2018-02-08 ENCOUNTER — Other Ambulatory Visit (INDEPENDENT_AMBULATORY_CARE_PROVIDER_SITE_OTHER): Payer: Self-pay

## 2018-02-08 ENCOUNTER — Ambulatory Visit (INDEPENDENT_AMBULATORY_CARE_PROVIDER_SITE_OTHER): Payer: Medicaid Other | Admitting: Orthopedic Surgery

## 2018-02-08 ENCOUNTER — Encounter (INDEPENDENT_AMBULATORY_CARE_PROVIDER_SITE_OTHER): Payer: Self-pay | Admitting: Orthopedic Surgery

## 2018-02-08 VITALS — Ht 72.0 in | Wt 225.0 lb

## 2018-02-08 DIAGNOSIS — M86171 Other acute osteomyelitis, right ankle and foot: Secondary | ICD-10-CM
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
## 2018-02-19 ENCOUNTER — Other Ambulatory Visit (INDEPENDENT_AMBULATORY_CARE_PROVIDER_SITE_OTHER): Payer: Self-pay | Admitting: Orthopedic Surgery

## 2018-02-22 NOTE — Telephone Encounter (Signed)
Ok for refill? 

## 2018-02-23 ENCOUNTER — Other Ambulatory Visit (INDEPENDENT_AMBULATORY_CARE_PROVIDER_SITE_OTHER): Payer: Self-pay | Admitting: Orthopedic Surgery

## 2018-02-23 ENCOUNTER — Telehealth (INDEPENDENT_AMBULATORY_CARE_PROVIDER_SITE_OTHER): Payer: Self-pay | Admitting: Orthopedic Surgery

## 2018-02-23 MED ORDER — COLCHICINE 0.6 MG PO CAPS: ORAL_CAPSULE | ORAL | 3 refills | Status: AC

## 2018-02-23 MED ORDER — ALLOPURINOL 100 MG PO TABS: 100.0000 mg | ORAL_TABLET | Freq: Two times a day (BID) | ORAL | 3 refills | Status: DC

## 2018-02-23 MED ORDER — OXYCODONE-ACETAMINOPHEN 5-325 MG PO TABS: 1.0000 | ORAL_TABLET | Freq: Three times a day (TID) | ORAL | 0 refills | Status: DC | PRN

## 2018-02-23 NOTE — Telephone Encounter (Signed)
Patient is demanding pain medication. He knows he was referred to pain management and I did advise that the notes said it could take up to 6 weeks for approval but he said he cannot wait for someone to help him. Please call patient  # [phone]

## 2018-02-23 NOTE — Telephone Encounter (Signed)
I left voicemail for patient advising. 

## 2018-02-23 NOTE — Telephone Encounter (Signed)
Please advise 

## 2018-02-23 NOTE — Telephone Encounter (Signed)
rx sent in for both

## 2018-02-23 NOTE — Telephone Encounter (Signed)
rx written

## 2018-02-23 NOTE — Telephone Encounter (Signed)
I called patient and advised script at front.  He forgot to mention he is out of his gout medicine (allopurinol and colchicine) and would like for you to refill those as well.  Please advise.

## 2018-03-02 ENCOUNTER — Encounter: Payer: Self-pay | Admitting: Internal Medicine

## 2018-03-02 ENCOUNTER — Ambulatory Visit (INDEPENDENT_AMBULATORY_CARE_PROVIDER_SITE_OTHER): Payer: Medicaid Other | Admitting: Internal Medicine

## 2018-03-02 DIAGNOSIS — M86471 Chronic osteomyelitis with draining sinus, right ankle and foot: Secondary | ICD-10-CM | POA: Diagnosis not present

## 2018-03-02 MED ORDER — DOXYCYCLINE HYCLATE 100 MG PO TABS: 100.0000 mg | ORAL_TABLET | Freq: Two times a day (BID) | ORAL | 1 refills | Status: DC
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
## 2018-03-02 NOTE — Assessment & Plan Note (Signed)
Overall he is better but has continued slow healing of the midportion of his incision.  I doubt this reflects persistent deep bone infection but I will continue doxycycline for now and see him back in 1 month.  He is in agreement with that plan.

## 2018-03-08 ENCOUNTER — Ambulatory Visit (INDEPENDENT_AMBULATORY_CARE_PROVIDER_SITE_OTHER): Payer: Medicaid Other | Admitting: Orthopedic Surgery

## 2018-03-08 ENCOUNTER — Encounter (INDEPENDENT_AMBULATORY_CARE_PROVIDER_SITE_OTHER): Payer: Self-pay | Admitting: Orthopedic Surgery

## 2018-03-08 VITALS — Wt 223.0 lb

## 2018-03-08 DIAGNOSIS — M86171 Other acute osteomyelitis, right ankle and foot: Secondary | ICD-10-CM

## 2018-03-08 MED ORDER — OXYCODONE-ACETAMINOPHEN 5-325 MG PO TABS: 1.0000 | ORAL_TABLET | Freq: Three times a day (TID) | ORAL | 0 refills | Status: DC | PRN

## 2018-03-16 ENCOUNTER — Telehealth (INDEPENDENT_AMBULATORY_CARE_PROVIDER_SITE_OTHER): Payer: Self-pay | Admitting: Orthopedic Surgery

## 2018-03-16 NOTE — Telephone Encounter (Signed)
Patient called asked for a call back to let him know which pain clinic he was referred to? Patient said it was not the [facility] that he was referred to. Patient asked if he could get a call back today. The number to contact patient is [phone] or [phone]

## 2018-03-17 ENCOUNTER — Encounter (INDEPENDENT_AMBULATORY_CARE_PROVIDER_SITE_OTHER): Payer: Self-pay | Admitting: Orthopedic Surgery

## 2018-03-17 NOTE — Telephone Encounter (Signed)
The referral for [facility] was made on 02/08/18. I called the office [phone] and left a message for the referral coordinator to ensure that this has been received and is currently being reviewed. Once I have verified this information I will call the pt to advise

## 2018-03-17 NOTE — Telephone Encounter (Signed)
Message sent in error
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
## 2018-03-19 NOTE — Telephone Encounter (Signed)
Patient called I did Advise patient about the information provided below. Patient would like to speak with Autumn to discuss patient care.

## 2018-03-19 NOTE — Telephone Encounter (Signed)
I called and lm on vm for pt to advise that [facility] states that they do not accept his insurance and can not make appt. I advised that we can send him to another facility like [facility] but not able to get him in a [facility]. To call and let me know what it is he would like to do.

## 2018-03-19 NOTE — Telephone Encounter (Signed)
Patient called upset because he received no call back today. Advised note below and asked if he could give us a chance to speak w/Duda about pain management.  He stated he will be to this office @ 8:00 on Monday coming. He was very angry and screaming.

## 2018-03-19 NOTE — Telephone Encounter (Signed)
Pain management referral will discuss with Dr. Lajoyce Cornersuda

## 2018-03-22 ENCOUNTER — Other Ambulatory Visit: Payer: Self-pay | Admitting: Internal Medicine

## 2018-03-22 DIAGNOSIS — M86471 Chronic osteomyelitis with draining sinus, right ankle and foot: Secondary | ICD-10-CM

## 2018-03-22 NOTE — Telephone Encounter (Signed)
I spoke with this pt and he would like to try [facility]. There is a referral in his chart from 02/06/18 for [facility] but they do not accept his insurance. Do I need to put another one in for this pt? Please mark it asap. He is very frustrated. I have advised that this is a lengthy process and takes 6-8 weeks to hear back about an appt.

## 2018-03-23 NOTE — Telephone Encounter (Signed)
Referral faxed to [facility], please allow 4-6 weeks for review.

## 2018-04-12 ENCOUNTER — Ambulatory Visit: Payer: Medicaid Other | Admitting: Internal Medicine

## 2018-04-12 DIAGNOSIS — M86671 Other chronic osteomyelitis, right ankle and foot: Secondary | ICD-10-CM | POA: Diagnosis present

## 2018-04-12 NOTE — Assessment & Plan Note (Signed)
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  I am hopeful that he has been cured.  He will stop doxycycline now and follow-up in 6 weeks.
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
## 2018-04-19 ENCOUNTER — Ambulatory Visit (INDEPENDENT_AMBULATORY_CARE_PROVIDER_SITE_OTHER): Payer: Medicaid Other | Admitting: Orthopedic Surgery

## 2018-04-19 ENCOUNTER — Encounter (INDEPENDENT_AMBULATORY_CARE_PROVIDER_SITE_OTHER): Payer: Self-pay | Admitting: Orthopedic Surgery

## 2018-04-19 DIAGNOSIS — T847XXS Infection and inflammatory reaction due to other internal orthopedic prosthetic devices, implants and grafts, sequela: Secondary | ICD-10-CM | POA: Diagnosis not present

## 2018-04-19 MED ORDER — OXYCODONE-ACETAMINOPHEN 5-325 MG PO TABS: 1.0000 | ORAL_TABLET | Freq: Three times a day (TID) | ORAL | 0 refills | Status: AC | PRN
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
## 2018-05-10 ENCOUNTER — Encounter (INDEPENDENT_AMBULATORY_CARE_PROVIDER_SITE_OTHER): Payer: Self-pay | Admitting: Orthopedic Surgery

## 2018-05-10 ENCOUNTER — Ambulatory Visit (INDEPENDENT_AMBULATORY_CARE_PROVIDER_SITE_OTHER): Payer: Medicaid Other | Admitting: Orthopedic Surgery

## 2018-05-10 VITALS — Ht 72.0 in | Wt 223.0 lb

## 2018-05-10 DIAGNOSIS — T847XXS Infection and inflammatory reaction due to other internal orthopedic prosthetic devices, implants and grafts, sequela: Secondary | ICD-10-CM

## 2018-05-10 MED ORDER — ALLOPURINOL 100 MG PO TABS: 100.0000 mg | ORAL_TABLET | Freq: Two times a day (BID) | ORAL | 3 refills | Status: AC

## 2018-05-10 MED ORDER — COLCHICINE 0.6 MG PO CAPS: 0.6000 mg | ORAL_CAPSULE | Freq: Two times a day (BID) | ORAL | 3 refills | Status: AC | PRN
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
## 2018-06-07 ENCOUNTER — Ambulatory Visit (INDEPENDENT_AMBULATORY_CARE_PROVIDER_SITE_OTHER): Payer: Medicaid Other | Admitting: Internal Medicine

## 2018-06-07 ENCOUNTER — Encounter: Payer: Self-pay | Admitting: Internal Medicine

## 2018-06-07 VITALS — BP 141/94 | HR 104 | Temp 98.1°F | Wt 227.0 lb

## 2018-06-07 DIAGNOSIS — M86671 Other chronic osteomyelitis, right ankle and foot: Secondary | ICD-10-CM | POA: Diagnosis not present

## 2018-06-07 NOTE — Progress Notes (Signed)
RFV: follow up for osteomyelitis of ankle with HW infection  Patient ID: Charles Blankenship, male   DOB: 09/09/1960, 57 y.o.   MRN: [id number]  HPI Charles Blankenship is a 57yo M with hx of right ankle MRSA osteo with HW infection, previously treated with 4 months of abtx and steadily improved. Not any worse since being off of antibiotics in the past 7 wks. His surgical incision site has not shown any signs of dehiscence. He continues to see dr duda, but recently released him from care since he has healed.in terms of chronic pain, he has been established with [facility].   pateint reports Walking 6-7 hrs a day, manageable with his current pain regimen. He continues to take allopurinol for gout and He sees his pcp for management of gout.   No recent illnesses  Outpatient Encounter Medications as of 06/07/2018  Medication Sig  . allopurinol (ZYLOPRIM) 100 MG tablet Take 1 tablet (100 mg total) by mouth 2 (two) times daily.  Marland Kitchen aspirin EC 81 MG tablet Take 81 mg by mouth daily.  . Colchicine 0.6 MG CAPS Take 1 capsule by mouth twice daily as needed for gout pain. Discontinue once pain resolves. Restart in event of acute gout flare up.  . Colchicine 0.6 MG CAPS Take 0.6 mg by mouth 2 (two) times daily as needed.  . cyclobenzaprine (FLEXERIL) 10 MG tablet Take 10 mg by mouth at bedtime.   . diphenhydrAMINE (BENADRYL) 25 MG tablet Take 25 mg by mouth 2 (two) times daily.   Marland Kitchen docusate sodium (COLACE) 100 MG capsule Take 1 capsule (100 mg total) by mouth 2 (two) times daily.  Marland Kitchen doxepin (SINEQUAN) 25 MG capsule Take 25 mg by mouth at bedtime.  . gabapentin (NEURONTIN) 300 MG capsule Take 200 mg at bedtime by mouth.   Marland Kitchen ibuprofen (ADVIL,MOTRIN) 800 MG tablet Take 800 mg by mouth every 8 (eight) hours as needed for headache or moderate pain.  Marland Kitchen lisinopril-hydrochlorothiazide (PRINZIDE,ZESTORETIC) 20-12.5 MG per tablet Take 1 tablet by mouth daily.  . methocarbamol (ROBAXIN) 500 MG tablet Take 1 tablet (500  mg total) by mouth every 6 (six) hours as needed for muscle spasms.  Marland Kitchen MITIGARE 0.6 MG CAPS TAKE 1 CAPSULE BY MOUTH TWICE DAILY AS NEEDED FOR GOUT PAIN. DISCONTINUE ONCE PAIN RESOLVE. RESTART IN EVENT OF ACUTE GOUT FLARE UP.  . Multiple Vitamin (MULTIVITAMIN WITH MINERALS) TABS tablet Take 1 tablet by mouth daily.  . mupirocin ointment (BACTROBAN) 2 % Apply 1 application topically 2 (two) times daily. Apply to the affected area 2 times a day  . oxyCODONE-acetaminophen (PERCOCET/ROXICET) 5-325 MG tablet Take 1 tablet by mouth every 8 (eight) hours as needed for severe pain.  . pravastatin (PRAVACHOL) 20 MG tablet Take 20 mg by mouth at bedtime.  . sildenafil (REVATIO) 20 MG tablet TAKE 40 MG BY MOUTH APPROXIMATELY 30 MINUTES PRIOR TO SEXUAL ACTIVITY AS NEEDED  . vitamin B-12 (CYANOCOBALAMIN) 500 MCG tablet Take 500 mcg by mouth daily.  . [DISCONTINUED] oxyCODONE-acetaminophen (PERCOCET/ROXICET) 5-325 MG tablet Take 1 tablet by mouth every 6 (six) hours as needed for severe pain.  . [DISCONTINUED] oxyCODONE-acetaminophen (PERCOCET/ROXICET) 5-325 MG tablet Take 1 tablet by mouth every 8 (eight) hours as needed.  . [DISCONTINUED] oxyCODONE-acetaminophen (PERCOCET/ROXICET) 5-325 MG tablet Take 1 tablet by mouth every 8 (eight) hours as needed for moderate pain or severe pain.   No facility-administered encounter medications on file as of 06/07/2018.      Patient Active Problem List  Diagnosis Date Noted  . MRSA infection 10/30/2017  . Foot abscess, right 10/28/2017  . Osteomyelitis of right foot (HCC)   . MSSA (methicillin susceptible Staphylococcus aureus) infection   . Group G streptococcal infection   . 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 Acute osteomyelitis of right calcaneus (HCC)   . Subacute osteomyelitis, right ankle and foot (HCC) 06/15/2017  . Chronic idiopathic gout involving toe of right foot without tophus 03/19/2017  . Infected hardware in right leg (HCC) 03/19/2017  . Displaced  fracture of body of right calcaneus, initial encounter for closed fracture      Health Maintenance Due  Topic Date Due  . Hepatitis C Screening  [birth year]  . HIV Screening  10/11/1975  . TETANUS/TDAP  10/11/1979  . COLONOSCOPY  10/11/2010  . INFLUENZA VACCINE  03/04/2018    Social History   Tobacco Use  . Smoking status: Former Smoker    Packs/day: 0.12    Years: 30.00    Pack years: 3.60    Types: Cigarettes  . Smokeless tobacco: Never Used  . Tobacco comment: quit smoking in November 2018  Substance Use Topics  . Alcohol use: No  . Drug use: No   Review of Systems 12 point ros is negative except for occ foot/1st ray pain with gouty flare Physical Exam   BP (!) 141/94   Pulse (!) 104   Temp 98.1 F (36.7 C)   Wt 227 lb (103 kg)   BMI 30.79 kg/m   gen = a x o by 4 in nad HEENT =wears right eye patch,sees adequately with left eye, not injected Right foot =lateral incision is well healed Skin = dry with some excoriation makrs noted on his ankle CBC Lab Results  Component Value Date   WBC 8.4 01/26/2018   RBC 3.81 (L) 01/26/2018   HGB 8.9 (L) 01/26/2018   HCT 27.6 (L) 01/26/2018   PLT 310 01/26/2018   MCV 72.4 (L) 01/26/2018   MCH 23.4 (L) 01/26/2018   MCHC 32.2 01/26/2018   RDW 17.9 (H) 01/26/2018   LYMPHSABS 2.0 11/08/2017   MONOABS 0.6 11/08/2017   EOSABS 0.4 11/08/2017    BMET Lab Results  Component Value Date   NA 136 01/26/2018   K 3.5 01/26/2018   CL 102 01/26/2018   CO2 23 01/26/2018   GLUCOSE 146 (H) 01/26/2018   BUN 11 01/26/2018   CREATININE 1.59 (H) 01/26/2018   CALCIUM 9.6 01/26/2018   GFRNONAA >60 11/08/2017   GFRAA >60 11/08/2017      Assessment and Plan  RTC as needed  Continue to use compression sock  No further need for abtx

## 2018-10-01 ENCOUNTER — Other Ambulatory Visit (INDEPENDENT_AMBULATORY_CARE_PROVIDER_SITE_OTHER): Payer: Self-pay | Admitting: Orthopedic Surgery

## 2019-02-16 ENCOUNTER — Telehealth: Payer: Self-pay

## 2019-02-16 NOTE — Telephone Encounter (Signed)
Patient added to Dr Hale Bogus hospital follow up on 02-22-2019.  Patient refused to see anyone else.   Laverle Patter, RN   Left message for [employer]

## 2019-02-16 NOTE — Telephone Encounter (Signed)
[place], [place] is calling to request ID visit this week preferably today or tomorrow with concerns related to right ankle/heel  lateral  wound progression.    Patient was stated on doxycycline for 15 day.   I have attempted to call the patient on his cell and his home number.  I left a message on the home phone, cell phone is ringing busy.   Laverle Patter, RN    [place], [place] would like a return call with appointment date and time once scheduled.

## 2019-02-22 ENCOUNTER — Other Ambulatory Visit: Payer: Self-pay

## 2019-02-22 ENCOUNTER — Ambulatory Visit (INDEPENDENT_AMBULATORY_CARE_PROVIDER_SITE_OTHER): Payer: Medicaid Other | Admitting: Internal Medicine

## 2019-02-22 ENCOUNTER — Encounter: Payer: Self-pay | Admitting: Internal Medicine

## 2019-02-22 ENCOUNTER — Ambulatory Visit
Admission: RE | Admit: 2019-02-22 | Discharge: 2019-02-22 | Disposition: A | Discharge status: Home / Self Care | Payer: Medicaid Other | Source: Ambulatory Visit | Attending: Internal Medicine | Admitting: Internal Medicine

## 2019-02-22 DIAGNOSIS — M86471 Chronic osteomyelitis with draining sinus, right ankle and foot: Secondary | ICD-10-CM | POA: Diagnosis present

## 2019-02-22 MED ORDER — DOXYCYCLINE HYCLATE 100 MG PO TABS: 100.0000 mg | ORAL_TABLET | Freq: Two times a day (BID) | ORAL | 1 refills | Status: DC
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
## 2019-02-23 ENCOUNTER — Telehealth: Payer: Self-pay | Admitting: *Deleted

## 2019-02-23 LAB — BASIC METABOLIC PANEL
BUN: 7 mg/dL (ref 7–25)
CO2: 33 mmol/L — ABNORMAL HIGH (ref 20–32)
Calcium: 8.8 mg/dL (ref 8.6–10.3)
Chloride: 101 mmol/L (ref 98–110)
Creat: 1.07 mg/dL (ref 0.70–1.33)
Glucose, Bld: 78 mg/dL (ref 65–99)
Potassium: 3.1 mmol/L — ABNORMAL LOW (ref 3.5–5.3)
Sodium: 141 mmol/L (ref 135–146)

## 2019-02-23 LAB — CBC
HCT: 25.9 % — ABNORMAL LOW (ref 38.5–50.0)
Hemoglobin: 8.5 g/dL — ABNORMAL LOW (ref 13.2–17.1)
MCH: 26.2 pg — ABNORMAL LOW (ref 27.0–33.0)
MCHC: 32.8 g/dL (ref 32.0–36.0)
MCV: 79.7 fL — ABNORMAL LOW (ref 80.0–100.0)
MPV: 10 fL (ref 7.5–12.5)
Platelets: 371 10*3/uL (ref 140–400)
RBC: 3.25 10*6/uL — ABNORMAL LOW (ref 4.20–5.80)
RDW: 16.9 % — ABNORMAL HIGH (ref 11.0–15.0)
WBC: 7.9 10*3/uL (ref 3.8–10.8)

## 2019-02-23 LAB — C-REACTIVE PROTEIN: CRP: 50.1 mg/L — ABNORMAL HIGH (ref <8.0)

## 2019-02-23 LAB — SEDIMENTATION RATE: Sed Rate: 126 mm/h — ABNORMAL HIGH (ref 0–20)

## 2019-02-23 NOTE — Telephone Encounter (Signed)
Thanks

## 2019-02-23 NOTE — Telephone Encounter (Signed)
I just sent the lab and x-ray results along with my note.  Thanks.

## 2019-02-23 NOTE — Telephone Encounter (Signed)
Patient called, would like the xray and lab results to be sent to his primary care, Royce Macadamia, PA-C.  RN called to confirm phone/fax numbers  (p) [phone] (f) [phone]

## 2019-03-01 ENCOUNTER — Ambulatory Visit
Admission: RE | Admit: 2019-03-01 | Discharge: 2019-03-01 | Disposition: A | Discharge status: Home / Self Care | Payer: Medicaid Other | Source: Ambulatory Visit | Attending: Internal Medicine | Admitting: Internal Medicine

## 2019-03-01 ENCOUNTER — Ambulatory Visit (INDEPENDENT_AMBULATORY_CARE_PROVIDER_SITE_OTHER): Payer: Medicaid Other | Admitting: Internal Medicine

## 2019-03-01 ENCOUNTER — Other Ambulatory Visit: Payer: Self-pay

## 2019-03-01 DIAGNOSIS — M86471 Chronic osteomyelitis with draining sinus, right ankle and foot: Secondary | ICD-10-CM

## 2019-03-01 DIAGNOSIS — M25551 Pain in right hip: Secondary | ICD-10-CM

## 2019-03-01 MED ORDER — DOXYCYCLINE HYCLATE 100 MG PO TABS: 100.0000 mg | ORAL_TABLET | Freq: Two times a day (BID) | ORAL | 1 refills | Status: AC
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
## 2019-03-01 NOTE — Assessment & Plan Note (Signed)
I will obtain an x-ray of his right prosthetic hip today.

## 2019-03-01 NOTE — Assessment & Plan Note (Signed)
I suspect that he has chronic osteomyelitis of his right heel.  He is improving on doxycycline.  I will continue doxycycline and see him back in 6 weeks.

## 2019-03-08 ENCOUNTER — Telehealth: Payer: Self-pay | Admitting: *Deleted

## 2019-03-08 NOTE — Telephone Encounter (Signed)
Patient called to ask for results of his recent xray. Advised him everything looked normal per the report. He then reports that his hip is hurting worse and he really needs it checked out. Advised him Dr Megan Salon is out but since we are seeing him for his foot if his hip is now hurting he should probably see his orth provider or his PCP in the mean time. He was agreeable to his and advise he will call them to get an appointment. Advised if they need the xray to call and I can fax it.

## 2019-03-11 NOTE — Telephone Encounter (Signed)
Received request from [facility] for patient's xray results to be faxed to [phone]. Done. Landis Gandy, RN

## 2019-03-31 ENCOUNTER — Telehealth: Payer: Self-pay | Admitting: Orthopedic Surgery

## 2019-03-31 NOTE — Telephone Encounter (Signed)
Pt request Rx for some orthopedic shoes. Pt wasn't sure he needed an appointment or if Dr Sharol Given could just give him the order. Pt's call back # (h) [phone] or (c) [phone]

## 2019-04-05 NOTE — Telephone Encounter (Signed)
I called pt and advised that per Dr. Sharol Given he can use a [employer] walking shoe. The prosthetic companies are no longer offering this shoes unless the pt has a history of amputation. Pt voiced understanding and will call with any questions.

## 2019-04-20 ENCOUNTER — Ambulatory Visit: Payer: Medicaid Other | Admitting: Internal Medicine

## 2019-09-05 ENCOUNTER — Emergency Department (HOSPITAL_COMMUNITY): Payer: Medicaid Other

## 2019-09-05 ENCOUNTER — Encounter (HOSPITAL_COMMUNITY): Payer: Self-pay

## 2019-09-05 ENCOUNTER — Inpatient Hospital Stay (HOSPITAL_COMMUNITY)
Admission: EM | Admit: 2019-09-05 | Discharge: 2019-10-03 | DRG: 871 | Disposition: E | Payer: Medicaid Other | Attending: Internal Medicine | Admitting: Internal Medicine

## 2019-09-05 ENCOUNTER — Other Ambulatory Visit: Payer: Self-pay

## 2019-09-05 DIAGNOSIS — R6520 Severe sepsis without septic shock: Secondary | ICD-10-CM

## 2019-09-05 DIAGNOSIS — E440 Moderate protein-calorie malnutrition: Secondary | ICD-10-CM | POA: Diagnosis not present

## 2019-09-05 DIAGNOSIS — G629 Polyneuropathy, unspecified: Secondary | ICD-10-CM | POA: Diagnosis present

## 2019-09-05 DIAGNOSIS — F4310 Post-traumatic stress disorder, unspecified: Secondary | ICD-10-CM | POA: Diagnosis present

## 2019-09-05 DIAGNOSIS — G9341 Metabolic encephalopathy: Secondary | ICD-10-CM | POA: Diagnosis present

## 2019-09-05 DIAGNOSIS — E871 Hypo-osmolality and hyponatremia: Secondary | ICD-10-CM | POA: Diagnosis present

## 2019-09-05 DIAGNOSIS — B954 Other streptococcus as the cause of diseases classified elsewhere: Secondary | ICD-10-CM | POA: Diagnosis not present

## 2019-09-05 DIAGNOSIS — A419 Sepsis, unspecified organism: Secondary | ICD-10-CM | POA: Diagnosis not present

## 2019-09-05 DIAGNOSIS — E785 Hyperlipidemia, unspecified: Secondary | ICD-10-CM | POA: Diagnosis present

## 2019-09-05 DIAGNOSIS — I468 Cardiac arrest due to other underlying condition: Secondary | ICD-10-CM | POA: Diagnosis not present

## 2019-09-05 DIAGNOSIS — I110 Hypertensive heart disease with heart failure: Secondary | ICD-10-CM | POA: Diagnosis present

## 2019-09-05 DIAGNOSIS — N179 Acute kidney failure, unspecified: Secondary | ICD-10-CM

## 2019-09-05 DIAGNOSIS — M86671 Other chronic osteomyelitis, right ankle and foot: Secondary | ICD-10-CM | POA: Diagnosis present

## 2019-09-05 DIAGNOSIS — Z96641 Presence of right artificial hip joint: Secondary | ICD-10-CM | POA: Diagnosis present

## 2019-09-05 DIAGNOSIS — I469 Cardiac arrest, cause unspecified: Secondary | ICD-10-CM | POA: Diagnosis not present

## 2019-09-05 DIAGNOSIS — L97429 Non-pressure chronic ulcer of left heel and midfoot with unspecified severity: Secondary | ICD-10-CM | POA: Diagnosis present

## 2019-09-05 DIAGNOSIS — I420 Dilated cardiomyopathy: Secondary | ICD-10-CM | POA: Diagnosis present

## 2019-09-05 DIAGNOSIS — F1021 Alcohol dependence, in remission: Secondary | ICD-10-CM | POA: Diagnosis not present

## 2019-09-05 DIAGNOSIS — K56609 Unspecified intestinal obstruction, unspecified as to partial versus complete obstruction: Secondary | ICD-10-CM | POA: Diagnosis not present

## 2019-09-05 DIAGNOSIS — Z20822 Contact with and (suspected) exposure to covid-19: Secondary | ICD-10-CM | POA: Diagnosis present

## 2019-09-05 DIAGNOSIS — R570 Cardiogenic shock: Secondary | ICD-10-CM | POA: Diagnosis not present

## 2019-09-05 DIAGNOSIS — L97519 Non-pressure chronic ulcer of other part of right foot with unspecified severity: Secondary | ICD-10-CM | POA: Diagnosis present

## 2019-09-05 DIAGNOSIS — E162 Hypoglycemia, unspecified: Secondary | ICD-10-CM | POA: Diagnosis present

## 2019-09-05 DIAGNOSIS — Z66 Do not resuscitate: Secondary | ICD-10-CM | POA: Diagnosis present

## 2019-09-05 DIAGNOSIS — R739 Hyperglycemia, unspecified: Secondary | ICD-10-CM | POA: Diagnosis not present

## 2019-09-05 DIAGNOSIS — L97909 Non-pressure chronic ulcer of unspecified part of unspecified lower leg with unspecified severity: Secondary | ICD-10-CM

## 2019-09-05 DIAGNOSIS — M86071 Acute hematogenous osteomyelitis, right ankle and foot: Secondary | ICD-10-CM | POA: Diagnosis not present

## 2019-09-05 DIAGNOSIS — M109 Gout, unspecified: Secondary | ICD-10-CM | POA: Diagnosis not present

## 2019-09-05 DIAGNOSIS — L03115 Cellulitis of right lower limb: Secondary | ICD-10-CM | POA: Diagnosis present

## 2019-09-05 DIAGNOSIS — Z452 Encounter for adjustment and management of vascular access device: Secondary | ICD-10-CM

## 2019-09-05 DIAGNOSIS — Z967 Presence of other bone and tendon implants: Secondary | ICD-10-CM

## 2019-09-05 DIAGNOSIS — Z7982 Long term (current) use of aspirin: Secondary | ICD-10-CM | POA: Diagnosis not present

## 2019-09-05 DIAGNOSIS — R69 Illness, unspecified: Secondary | ICD-10-CM

## 2019-09-05 DIAGNOSIS — Z1389 Encounter for screening for other disorder: Secondary | ICD-10-CM

## 2019-09-05 DIAGNOSIS — R6521 Severe sepsis with septic shock: Secondary | ICD-10-CM | POA: Diagnosis present

## 2019-09-05 DIAGNOSIS — N170 Acute kidney failure with tubular necrosis: Secondary | ICD-10-CM | POA: Diagnosis present

## 2019-09-05 DIAGNOSIS — L03031 Cellulitis of right toe: Secondary | ICD-10-CM

## 2019-09-05 DIAGNOSIS — M10442 Other secondary gout, left hand: Secondary | ICD-10-CM | POA: Diagnosis not present

## 2019-09-05 DIAGNOSIS — E872 Acidosis: Secondary | ICD-10-CM | POA: Diagnosis present

## 2019-09-05 DIAGNOSIS — I5023 Acute on chronic systolic (congestive) heart failure: Secondary | ICD-10-CM | POA: Diagnosis present

## 2019-09-05 DIAGNOSIS — Z9911 Dependence on respirator [ventilator] status: Secondary | ICD-10-CM | POA: Diagnosis not present

## 2019-09-05 DIAGNOSIS — R7881 Bacteremia: Secondary | ICD-10-CM | POA: Diagnosis not present

## 2019-09-05 DIAGNOSIS — I87339 Chronic venous hypertension (idiopathic) with ulcer and inflammation of unspecified lower extremity: Secondary | ICD-10-CM

## 2019-09-05 DIAGNOSIS — I10 Essential (primary) hypertension: Secondary | ICD-10-CM | POA: Diagnosis not present

## 2019-09-05 DIAGNOSIS — A4189 Other specified sepsis: Secondary | ICD-10-CM

## 2019-09-05 DIAGNOSIS — M86679 Other chronic osteomyelitis, unspecified ankle and foot: Secondary | ICD-10-CM | POA: Diagnosis not present

## 2019-09-05 DIAGNOSIS — E43 Unspecified severe protein-calorie malnutrition: Secondary | ICD-10-CM | POA: Diagnosis present

## 2019-09-05 DIAGNOSIS — R198 Other specified symptoms and signs involving the digestive system and abdomen: Secondary | ICD-10-CM

## 2019-09-05 DIAGNOSIS — J9601 Acute respiratory failure with hypoxia: Secondary | ICD-10-CM | POA: Diagnosis not present

## 2019-09-05 DIAGNOSIS — A4101 Sepsis due to Methicillin susceptible Staphylococcus aureus: Principal | ICD-10-CM | POA: Diagnosis present

## 2019-09-05 DIAGNOSIS — K7460 Unspecified cirrhosis of liver: Secondary | ICD-10-CM

## 2019-09-05 DIAGNOSIS — G9340 Encephalopathy, unspecified: Secondary | ICD-10-CM

## 2019-09-05 DIAGNOSIS — K567 Ileus, unspecified: Secondary | ICD-10-CM

## 2019-09-05 DIAGNOSIS — R339 Retention of urine, unspecified: Secondary | ICD-10-CM | POA: Diagnosis present

## 2019-09-05 DIAGNOSIS — M86171 Other acute osteomyelitis, right ankle and foot: Secondary | ICD-10-CM | POA: Diagnosis not present

## 2019-09-05 DIAGNOSIS — B9561 Methicillin susceptible Staphylococcus aureus infection as the cause of diseases classified elsewhere: Secondary | ICD-10-CM

## 2019-09-05 DIAGNOSIS — R4182 Altered mental status, unspecified: Secondary | ICD-10-CM | POA: Diagnosis present

## 2019-09-05 DIAGNOSIS — R34 Anuria and oliguria: Secondary | ICD-10-CM | POA: Diagnosis present

## 2019-09-05 DIAGNOSIS — L03116 Cellulitis of left lower limb: Secondary | ICD-10-CM | POA: Diagnosis present

## 2019-09-05 DIAGNOSIS — Z79899 Other long term (current) drug therapy: Secondary | ICD-10-CM | POA: Diagnosis not present

## 2019-09-05 DIAGNOSIS — M86072 Acute hematogenous osteomyelitis, left ankle and foot: Secondary | ICD-10-CM | POA: Diagnosis not present

## 2019-09-05 DIAGNOSIS — F1410 Cocaine abuse, uncomplicated: Secondary | ICD-10-CM | POA: Diagnosis present

## 2019-09-05 DIAGNOSIS — Z8674 Personal history of sudden cardiac arrest: Secondary | ICD-10-CM | POA: Diagnosis not present

## 2019-09-05 LAB — MAGNESIUM: Magnesium: 1.9 mg/dL (ref 1.7–2.4)

## 2019-09-05 LAB — AMMONIA: Ammonia: 19 umol/L (ref 9–35)

## 2019-09-05 LAB — PHOSPHORUS: Phosphorus: 4.2 mg/dL (ref 2.5–4.6)

## 2019-09-05 LAB — URINALYSIS, ROUTINE W REFLEX MICROSCOPIC
Bilirubin Urine: NEGATIVE
Glucose, UA: NEGATIVE mg/dL
Ketones, ur: NEGATIVE mg/dL
Nitrite: NEGATIVE
Protein, ur: 30 mg/dL — AB
Specific Gravity, Urine: 1.015 (ref 1.005–1.030)
WBC, UA: >50 WBC/hpf — ABNORMAL HIGH (ref 0–5)
pH: 5 (ref 5.0–8.0)

## 2019-09-05 LAB — CBC WITH DIFFERENTIAL/PLATELET
Abs Immature Granulocytes: 0.2 10*3/uL — ABNORMAL HIGH (ref 0.00–0.07)
Band Neutrophils: 69 %
Basophils Absolute: 0 10*3/uL (ref 0.0–0.1)
Basophils Relative: 0 %
Eosinophils Absolute: 0 10*3/uL (ref 0.0–0.5)
Eosinophils Relative: 0 %
HCT: 33.6 % — ABNORMAL LOW (ref 39.0–52.0)
Hemoglobin: 11.2 g/dL — ABNORMAL LOW (ref 13.0–17.0)
Lymphocytes Relative: 8 %
Lymphs Abs: 0.9 10*3/uL (ref 0.7–4.0)
MCH: 25 pg — ABNORMAL LOW (ref 26.0–34.0)
MCHC: 33.3 g/dL (ref 30.0–36.0)
MCV: 75 fL — ABNORMAL LOW (ref 80.0–100.0)
Monocytes Absolute: 0.1 10*3/uL (ref 0.1–1.0)
Monocytes Relative: 1 %
Myelocytes: 2 %
Neutro Abs: 9.7 10*3/uL — ABNORMAL HIGH (ref 1.7–7.7)
Neutrophils Relative %: 20 %
Platelets: 133 10*3/uL — ABNORMAL LOW (ref 150–400)
RBC: 4.48 MIL/uL (ref 4.22–5.81)
RDW: 19.7 % — ABNORMAL HIGH (ref 11.5–15.5)
WBC: 10.9 10*3/uL — ABNORMAL HIGH (ref 4.0–10.5)
nRBC: 0.4 % — ABNORMAL HIGH (ref 0.0–0.2)

## 2019-09-05 LAB — RAPID URINE DRUG SCREEN, HOSP PERFORMED
Amphetamines: NOT DETECTED
Barbiturates: NOT DETECTED
Benzodiazepines: NOT DETECTED
Cocaine: POSITIVE — AB
Opiates: NOT DETECTED
Tetrahydrocannabinol: NOT DETECTED

## 2019-09-05 LAB — COMPREHENSIVE METABOLIC PANEL
ALT: 92 U/L — ABNORMAL HIGH (ref 0–44)
AST: 137 U/L — ABNORMAL HIGH (ref 15–41)
Albumin: 1.5 g/dL — ABNORMAL LOW (ref 3.5–5.0)
Alkaline Phosphatase: 144 U/L — ABNORMAL HIGH (ref 38–126)
Anion gap: 18 — ABNORMAL HIGH (ref 5–15)
BUN: 46 mg/dL — ABNORMAL HIGH (ref 6–20)
CO2: 16 mmol/L — ABNORMAL LOW (ref 22–32)
Calcium: 8.4 mg/dL — ABNORMAL LOW (ref 8.9–10.3)
Chloride: 98 mmol/L (ref 98–111)
Creatinine, Ser: 1.6 mg/dL — ABNORMAL HIGH (ref 0.61–1.24)
GFR calc Af Amer: 54 mL/min — ABNORMAL LOW (ref >60)
GFR calc non Af Amer: 47 mL/min — ABNORMAL LOW (ref >60)
Glucose, Bld: 38 mg/dL — CL (ref 70–99)
Potassium: 4.4 mmol/L (ref 3.5–5.1)
Sodium: 132 mmol/L — ABNORMAL LOW (ref 135–145)
Total Bilirubin: 6 mg/dL — ABNORMAL HIGH (ref 0.3–1.2)
Total Protein: 7.1 g/dL (ref 6.5–8.1)

## 2019-09-05 LAB — RESPIRATORY PANEL BY RT PCR (FLU A&B, COVID)
Influenza A by PCR: NEGATIVE
Influenza B by PCR: NEGATIVE
SARS Coronavirus 2 by RT PCR: NEGATIVE

## 2019-09-05 LAB — MRSA PCR SCREENING: MRSA by PCR: NEGATIVE

## 2019-09-05 LAB — CBG MONITORING, ED
Glucose-Capillary: 23 mg/dL — CL (ref 70–99)
Glucose-Capillary: 76 mg/dL (ref 70–99)
Glucose-Capillary: 39 mg/dL — CL (ref 70–99)
Glucose-Capillary: 98 mg/dL (ref 70–99)

## 2019-09-05 LAB — LACTIC ACID, PLASMA
Lactic Acid, Venous: 4.1 mmol/L (ref 0.5–1.9)
Lactic Acid, Venous: 4.8 mmol/L (ref 0.5–1.9)
Lactic Acid, Venous: 5.2 mmol/L (ref 0.5–1.9)

## 2019-09-05 LAB — ETHANOL: Alcohol, Ethyl (B): <10 mg/dL (ref <10)

## 2019-09-05 LAB — PROTIME-INR
INR: 1.9 — ABNORMAL HIGH (ref 0.8–1.2)
Prothrombin Time: 21.5 seconds — ABNORMAL HIGH (ref 11.4–15.2)

## 2019-09-05 LAB — GLUCOSE, CAPILLARY
Glucose-Capillary: 163 mg/dL — ABNORMAL HIGH (ref 70–99)
Glucose-Capillary: 50 mg/dL — ABNORMAL LOW (ref 70–99)
Glucose-Capillary: 117 mg/dL — ABNORMAL HIGH (ref 70–99)

## 2019-09-05 LAB — C-REACTIVE PROTEIN: CRP: 25.4 mg/dL — ABNORMAL HIGH (ref <1.0)

## 2019-09-05 LAB — URIC ACID: Uric Acid, Serum: 6.6 mg/dL (ref 3.7–8.6)

## 2019-09-05 LAB — CORTISOL: Cortisol, Plasma: 94.2 ug/dL

## 2019-09-05 LAB — APTT: aPTT: 36 seconds (ref 24–36)

## 2019-09-05 MED ORDER — DEXTROSE 50 % IV SOLN
INTRAVENOUS | Status: AC
  Filled 2019-09-05: qty 50

## 2019-09-05 MED ORDER — SODIUM CHLORIDE 0.9 % IV SOLN
2.0000 g | Freq: Once | INTRAVENOUS | Status: AC
  Administered 2019-09-05: 2 g via INTRAVENOUS
  Filled 2019-09-05: qty 2

## 2019-09-05 MED ORDER — SODIUM CHLORIDE 0.9 % IV BOLUS
1000.0000 mL | Freq: Once | INTRAVENOUS | Status: AC
  Administered 2019-09-05: 22:00:00 1000 mL via INTRAVENOUS

## 2019-09-05 MED ORDER — LACTATED RINGERS IV BOLUS (SEPSIS)
1000.0000 mL | Freq: Once | INTRAVENOUS | Status: AC
  Administered 2019-09-05: 1000 mL via INTRAVENOUS

## 2019-09-05 MED ORDER — DEXTROSE 50 % IV SOLN: 25.0000 g | INTRAVENOUS | Status: AC

## 2019-09-05 MED ORDER — LACTATED RINGERS IV SOLN: INTRAVENOUS | Status: DC

## 2019-09-05 MED ORDER — VANCOMYCIN HCL 1500 MG/300ML IV SOLN
1500.0000 mg | INTRAVENOUS | Status: DC
  Filled 2019-09-05: qty 300

## 2019-09-05 MED ORDER — LACTATED RINGERS IV BOLUS (SEPSIS)
1000.0000 mL | Freq: Once | INTRAVENOUS | Status: AC
  Administered 2019-09-05: 14:00:00 1000 mL via INTRAVENOUS

## 2019-09-05 MED ORDER — DEXTROSE 50 % IV SOLN
1.0000 | Freq: Once | INTRAVENOUS | Status: AC
  Administered 2019-09-05: 50 mL via INTRAVENOUS

## 2019-09-05 MED ORDER — DEXTROSE 10 % IV SOLN
INTRAVENOUS | Status: DC
  Administered 2019-09-06: 125 mL/h via INTRAVENOUS

## 2019-09-05 MED ORDER — VANCOMYCIN HCL 2000 MG/400ML IV SOLN
2000.0000 mg | Freq: Once | INTRAVENOUS | Status: AC
  Administered 2019-09-05: 18:00:00 2000 mg via INTRAVENOUS
  Filled 2019-09-05: qty 400

## 2019-09-05 MED ORDER — VANCOMYCIN HCL IN DEXTROSE 1-5 GM/200ML-% IV SOLN: 1000.0000 mg | Freq: Once | INTRAVENOUS | Status: DC

## 2019-09-05 MED ORDER — HEPARIN SODIUM (PORCINE) 5000 UNIT/ML IJ SOLN
5000.0000 [IU] | Freq: Three times a day (TID) | INTRAMUSCULAR | Status: DC
  Administered 2019-09-05 – 2019-09-06 (×3): 5000 [IU] via SUBCUTANEOUS
  Filled 2019-09-05 (×3): qty 1

## 2019-09-05 MED ORDER — CHLORHEXIDINE GLUCONATE CLOTH 2 % EX PADS
6.0000 | MEDICATED_PAD | Freq: Every day | CUTANEOUS | Status: DC
  Administered 2019-09-06 – 2019-09-08 (×2): 6 via TOPICAL

## 2019-09-05 MED ORDER — METRONIDAZOLE IN NACL 5-0.79 MG/ML-% IV SOLN
500.0000 mg | Freq: Once | INTRAVENOUS | Status: AC
  Administered 2019-09-05: 500 mg via INTRAVENOUS
  Filled 2019-09-05: qty 100

## 2019-09-05 MED ORDER — SODIUM CHLORIDE 0.9 % IV SOLN
2.0000 g | Freq: Two times a day (BID) | INTRAVENOUS | Status: DC
  Administered 2019-09-05 – 2019-09-07 (×4): 2 g via INTRAVENOUS
  Filled 2019-09-05 (×3): qty 2

## 2019-09-05 MED ORDER — DEXTROSE 50 % IV SOLN
1.0000 | Freq: Once | INTRAVENOUS | Status: AC
  Administered 2019-09-05: 18:00:00 50 mL via INTRAVENOUS
  Filled 2019-09-05: qty 50

## 2019-09-05 MED ORDER — PREDNISONE 10 MG PO TABS
40.0000 mg | ORAL_TABLET | Freq: Every day | ORAL | Status: DC
  Administered 2019-09-06: 40 mg via ORAL
  Filled 2019-09-05: qty 4

## 2019-09-05 NOTE — Progress Notes (Addendum)
eLink Physician-Brief Progress Note Patient Name: KAPONO LUHN DOB: April 27, 1961 MRN: [id number]   Date of Service  10/01/2019  HPI/Events of Note  59 year old gentleman with longstanding hypertension and gout who presents with several days of decreased oral intake, nausea vomiting.  He was Covid negative in the ED.  Was reportedly more lethargic earlier but on my exam is awake alert oriented complaining of pain would like to eat and drink and watch TV.  Feels that he has gout in his left upper extremity. Felt to be septic and started on empiric Vancomycin and Cefepime. BP soft = 88/62 with MAP = 67. Lactic Acid  = 4.8.   eICU Interventions  Will order: 1. Monitor CVP now and Q 4 hours.  2. Bolus with 0.9 NaCl 1 liter IV over 1 hour now.  3. Continue to trend Lactic Acid.     Intervention Category Evaluation Type: New Patient Evaluation  Lenell Antu 09/12/2019, 9:31 PM

## 2019-09-05 NOTE — Progress Notes (Signed)
Notified bedside nurse of need to draw blood cultures.  

## 2019-09-05 NOTE — Progress Notes (Signed)
Pharmacy Antibiotic Note  Charles Blankenship is a 59 y.o. male admitted on 09/22/19 with sepsis.  R-foot infection with drainage/redness up leg.  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  Pharmacy has been consulted for vancomycin and cefepime dosing.  Plan: Vancomycin 2000mg  IV x 1, then 1500 mg IV every 24 hours Goal AUC 400-550. Expected AUC: 458 SCr used: 1.6 Cefepime 2g IV every 12 hours Monitor renal function, Cx and clinical progression to narrow Vancomycin levels at steady state     Temp (24hrs), Avg:97.2 F (36.2 C), Min:97.2 F (36.2 C), Max:97.2 F (36.2 C)  No results for input(s): WBC, CREATININE, LATICACIDVEN, VANCOTROUGH, VANCOPEAK, VANCORANDOM, GENTTROUGH, GENTPEAK, GENTRANDOM, TOBRATROUGH, TOBRAPEAK, TOBRARND, AMIKACINPEAK, AMIKACINTROU, AMIKACIN in the last 168 hours.  CrCl cannot be calculated (Patient's most recent lab result is older than the maximum 21 days allowed.).    No Known Allergies  , PharmD Clinical Pharmacist Please check AMION for all [facility] numbers 22-Sep-2019 1:29 PM

## 2019-09-05 NOTE — ED Triage Notes (Signed)
Pt has infection to right foot, heal area, drainage noted, swelling and redness spreading up the leg

## 2019-09-05 NOTE — ED Notes (Signed)
Pt cbg 36.  No md assigned.  EDP gave order for 1 amp d50.  Sbp also in 80's.  Was able to contact family medicine.

## 2019-09-05 NOTE — ED Notes (Signed)
Received call from lab that pt's blood sugar was 38.  MD notified.  D50 given.
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
## 2019-09-05 NOTE — ED Notes (Signed)
Ex-wife is at bedside and she is taking pt belongings home.

## 2019-09-05 NOTE — H&P (Signed)
Please see the APP separately documented note for a full history and physical examination. Below is my attestation.  I agree with the Advanced Practitioner's note, impression, and recommendations as outlined. I have taken an independent interval history, reviewed the chart and examined the patient.  My medical decision making is as follows:   Subjective: Is a 59 year old gentleman with longstanding hypertension and gout who presents with several days of decreased oral intake, nausea vomiting.  He was Covid negative in the ED.  Was reportedly more lethargic earlier but on my exam is awake alert oriented complaining of pain would like to eat and drink and watch TV.  Feels that he has gout in his left upper extremity  Objective: Vitals:   09/17/19 1800 09/17/19 1815  BP: (!) 85/64   Pulse:    Resp: 18 (!) 22  Temp:    SpO2:      Gen:       No acute distress HEENT: Enucleation of the right eye Lungs:    Clear to auscultation bilaterally; normal respiratory effort CV:         Tachycardic rate and regular rhythm; no murmurs Abd:      + bowel sounds; soft, non-tender; no palpable masses, no distension Ext:    2+ bilateral lower extremity edema, there is confluent erythematous rash that extends to just below the knees.  The left upper extremity metacarpal joints are swollen, erythematous, and warm Skin:       Warm and dry; no rash Neuro:    Moves all 4 extremities independently Psych:    normal mood and affect   Labs/Imaging: Leukocytosis with left shift, acute kidney injury, INR 1.9, blood sugars low  Assessment and Plan: 59 year old gentleman with history of hypertension and chronic osteomyelitis of the left foot who presents with:  Acute encephalopathy, now slowly improving Severe sepsis, likely secondary to skin and soft tissue infection Acute gout attack, suspected in the left upper extremity Lactic acidosis Refractory hypoglycemia  Patient is currently on a D10 drip for his  refractory hypoglycemia.  This may have been the source of his lethargy earlier.  His appetite is not improving hopefully he can take some oral intake improve this.  We will continue vancomycin, cefepime, Flagyl empirically for MRSA, gram-negative, and anaerobic organisms which could precipitate his severe sepsis response in the setting of osteomyelitis.  We will check a uric acid start prednisone 40 mg daily for his acute gout flare.  He is already received substantial fluid resuscitation, he may require vasoactive agents, septic shock may be impending which is admission to the ICU for now.  Plan was discussed with the patient's wife at the bedside.  They do not live together.  The patient is critically ill with multiple organ systems failure and requires high complexity decision making for assessment and support, frequent evaluation and titration of therapies, application of advanced monitoring technologies and extensive interpretation of multiple databases.   Critical Care Time devoted to patient care services described in this note is 37 minutes. This time reflects time of care of this signee [name] . This critical care time does not reflect separately billable procedures or procedure time, teaching time and supervisory time of PA/NP/Med student/Med Resident etc but could involve care discussion time.  Mickel Baas Pulmonary and Critical Care Medicine 09-17-19 7:13 PM  Pager: [phone] After hours pager: [phone]
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
## 2019-09-06 ENCOUNTER — Telehealth: Payer: Self-pay

## 2019-09-06 ENCOUNTER — Inpatient Hospital Stay (HOSPITAL_COMMUNITY): Payer: Medicaid Other

## 2019-09-06 ENCOUNTER — Other Ambulatory Visit: Payer: Self-pay | Admitting: Physician Assistant

## 2019-09-06 ENCOUNTER — Inpatient Hospital Stay: Payer: Self-pay

## 2019-09-06 DIAGNOSIS — L97909 Non-pressure chronic ulcer of unspecified part of unspecified lower leg with unspecified severity: Secondary | ICD-10-CM

## 2019-09-06 DIAGNOSIS — E43 Unspecified severe protein-calorie malnutrition: Secondary | ICD-10-CM

## 2019-09-06 DIAGNOSIS — M86071 Acute hematogenous osteomyelitis, right ankle and foot: Secondary | ICD-10-CM

## 2019-09-06 DIAGNOSIS — I87339 Chronic venous hypertension (idiopathic) with ulcer and inflammation of unspecified lower extremity: Secondary | ICD-10-CM

## 2019-09-06 LAB — SEDIMENTATION RATE: Sed Rate: 78 mm/hr — ABNORMAL HIGH (ref 0–16)

## 2019-09-06 LAB — PROTIME-INR
INR: 2.2 — ABNORMAL HIGH (ref 0.8–1.2)
Prothrombin Time: 24.4 seconds — ABNORMAL HIGH (ref 11.4–15.2)

## 2019-09-06 LAB — CBC
HCT: 27.2 % — ABNORMAL LOW (ref 39.0–52.0)
Hemoglobin: 9.2 g/dL — ABNORMAL LOW (ref 13.0–17.0)
MCH: 25 pg — ABNORMAL LOW (ref 26.0–34.0)
MCHC: 33.8 g/dL (ref 30.0–36.0)
MCV: 73.9 fL — ABNORMAL LOW (ref 80.0–100.0)
Platelets: 80 10*3/uL — ABNORMAL LOW (ref 150–400)
RBC: 3.68 MIL/uL — ABNORMAL LOW (ref 4.22–5.81)
RDW: 19.3 % — ABNORMAL HIGH (ref 11.5–15.5)
WBC: 5.6 10*3/uL (ref 4.0–10.5)
nRBC: 1.1 % — ABNORMAL HIGH (ref 0.0–0.2)

## 2019-09-06 LAB — GLUCOSE, CAPILLARY
Glucose-Capillary: 100 mg/dL — ABNORMAL HIGH (ref 70–99)
Glucose-Capillary: 103 mg/dL — ABNORMAL HIGH (ref 70–99)
Glucose-Capillary: 104 mg/dL — ABNORMAL HIGH (ref 70–99)
Glucose-Capillary: 108 mg/dL — ABNORMAL HIGH (ref 70–99)
Glucose-Capillary: 128 mg/dL — ABNORMAL HIGH (ref 70–99)
Glucose-Capillary: 129 mg/dL — ABNORMAL HIGH (ref 70–99)
Glucose-Capillary: 131 mg/dL — ABNORMAL HIGH (ref 70–99)
Glucose-Capillary: 147 mg/dL — ABNORMAL HIGH (ref 70–99)
Glucose-Capillary: 149 mg/dL — ABNORMAL HIGH (ref 70–99)
Glucose-Capillary: 55 mg/dL — ABNORMAL LOW (ref 70–99)
Glucose-Capillary: 75 mg/dL (ref 70–99)
Glucose-Capillary: 87 mg/dL (ref 70–99)
Glucose-Capillary: 91 mg/dL (ref 70–99)
Glucose-Capillary: 98 mg/dL (ref 70–99)

## 2019-09-06 LAB — LACTIC ACID, PLASMA
Lactic Acid, Venous: 7.3 mmol/L (ref 0.5–1.9)
Lactic Acid, Venous: 7.3 mmol/L (ref 0.5–1.9)
Lactic Acid, Venous: 6 mmol/L (ref 0.5–1.9)

## 2019-09-06 LAB — POCT I-STAT 7, (LYTES, BLD GAS, ICA,H+H)
Acid-base deficit: 12 mmol/L — ABNORMAL HIGH (ref 0.0–2.0)
Bicarbonate: 12 mmol/L — ABNORMAL LOW (ref 20.0–28.0)
Calcium, Ion: 1.08 mmol/L — ABNORMAL LOW (ref 1.15–1.40)
HCT: 28 % — ABNORMAL LOW (ref 39.0–52.0)
Hemoglobin: 9.5 g/dL — ABNORMAL LOW (ref 13.0–17.0)
O2 Saturation: 99 %
Patient temperature: 96.1
Potassium: 4.3 mmol/L (ref 3.5–5.1)
Sodium: 127 mmol/L — ABNORMAL LOW (ref 135–145)
TCO2: 13 mmol/L — ABNORMAL LOW (ref 22–32)
pCO2 arterial: 20.3 mmHg — ABNORMAL LOW (ref 32.0–48.0)
pH, Arterial: 7.374 (ref 7.350–7.450)
pO2, Arterial: 122 mmHg — ABNORMAL HIGH (ref 83.0–108.0)

## 2019-09-06 LAB — COMPREHENSIVE METABOLIC PANEL
ALT: 68 U/L — ABNORMAL HIGH (ref 0–44)
AST: 97 U/L — ABNORMAL HIGH (ref 15–41)
Albumin: 1 g/dL — ABNORMAL LOW (ref 3.5–5.0)
Alkaline Phosphatase: 98 U/L (ref 38–126)
Anion gap: 16 — ABNORMAL HIGH (ref 5–15)
BUN: 50 mg/dL — ABNORMAL HIGH (ref 6–20)
CO2: 13 mmol/L — ABNORMAL LOW (ref 22–32)
Calcium: 7.5 mg/dL — ABNORMAL LOW (ref 8.9–10.3)
Chloride: 101 mmol/L (ref 98–111)
Creatinine, Ser: 1.58 mg/dL — ABNORMAL HIGH (ref 0.61–1.24)
GFR calc Af Amer: 55 mL/min — ABNORMAL LOW (ref >60)
GFR calc non Af Amer: 48 mL/min — ABNORMAL LOW (ref >60)
Glucose, Bld: 139 mg/dL — ABNORMAL HIGH (ref 70–99)
Potassium: 4.6 mmol/L (ref 3.5–5.1)
Sodium: 130 mmol/L — ABNORMAL LOW (ref 135–145)
Total Bilirubin: 5.8 mg/dL — ABNORMAL HIGH (ref 0.3–1.2)
Total Protein: 5.5 g/dL — ABNORMAL LOW (ref 6.5–8.1)

## 2019-09-06 LAB — TYPE AND SCREEN
ABO/RH(D): A POS
Antibody Screen: NEGATIVE

## 2019-09-06 LAB — COOXEMETRY PANEL
Carboxyhemoglobin: 1.1 % (ref 0.5–1.5)
Methemoglobin: 1.4 % (ref 0.0–1.5)
O2 Saturation: 47.9 %
Total hemoglobin: 9.6 g/dL — ABNORMAL LOW (ref 12.0–16.0)

## 2019-09-06 LAB — HIV ANTIBODY (ROUTINE TESTING W REFLEX): HIV Screen 4th Generation wRfx: NONREACTIVE — AB

## 2019-09-06 LAB — MAGNESIUM: Magnesium: 1.7 mg/dL (ref 1.7–2.4)

## 2019-09-06 LAB — ABO/RH: ABO/RH(D): A POS

## 2019-09-06 MED ORDER — ORAL CARE MOUTH RINSE
15.0000 mL | Freq: Two times a day (BID) | OROMUCOSAL | Status: DC
  Administered 2019-09-06 – 2019-09-07 (×2): 15 mL via OROMUCOSAL

## 2019-09-06 MED ORDER — COLCHICINE 0.6 MG PO TABS
0.6000 mg | ORAL_TABLET | Freq: Two times a day (BID) | ORAL | Status: DC
  Administered 2019-09-06: 0.6 mg via ORAL
  Filled 2019-09-06 (×4): qty 1

## 2019-09-06 MED ORDER — SODIUM CHLORIDE 0.9% IV SOLUTION: Freq: Once | INTRAVENOUS | Status: DC

## 2019-09-06 MED ORDER — SODIUM CHLORIDE 0.9% FLUSH
10.0000 mL | Freq: Two times a day (BID) | INTRAVENOUS | Status: DC
  Administered 2019-09-07 – 2019-09-09 (×5): 10 mL
  Administered 2019-09-09: 21:00:00 20 mL

## 2019-09-06 MED ORDER — LORAZEPAM 2 MG/ML IJ SOLN
1.0000 mg | Freq: Once | INTRAMUSCULAR | Status: AC
  Administered 2019-09-06: 14:00:00 1 mg via INTRAVENOUS

## 2019-09-06 MED ORDER — GERHARDT'S BUTT CREAM
TOPICAL_CREAM | Freq: Three times a day (TID) | CUTANEOUS | Status: DC
  Administered 2019-09-06: 1 via TOPICAL
  Filled 2019-09-06: qty 1

## 2019-09-06 MED ORDER — SODIUM CHLORIDE 0.9 % IV BOLUS
1000.0000 mL | Freq: Once | INTRAVENOUS | Status: AC
  Administered 2019-09-06: 1000 mL via INTRAVENOUS

## 2019-09-06 MED ORDER — COLCHICINE 0.6 MG PO TABS
1.2000 mg | ORAL_TABLET | Freq: Once | ORAL | Status: AC
  Administered 2019-09-06: 1.2 mg via ORAL
  Filled 2019-09-06: qty 2

## 2019-09-06 MED ORDER — LORAZEPAM 2 MG/ML IJ SOLN
INTRAMUSCULAR | Status: AC
  Filled 2019-09-06: qty 1

## 2019-09-06 MED ORDER — MUPIROCIN 2 % EX OINT
1.0000 "application " | TOPICAL_OINTMENT | Freq: Two times a day (BID) | CUTANEOUS | Status: DC
  Administered 2019-09-07: 1 via NASAL
  Filled 2019-09-06: qty 22

## 2019-09-06 MED ORDER — MAGNESIUM SULFATE IN D5W 1-5 GM/100ML-% IV SOLN
1.0000 g | Freq: Once | INTRAVENOUS | Status: AC
  Administered 2019-09-06: 06:00:00 1 g via INTRAVENOUS
  Filled 2019-09-06: qty 100

## 2019-09-06 MED ORDER — NOREPINEPHRINE 4 MG/250ML-% IV SOLN
0.0000 ug/min | INTRAVENOUS | Status: DC
  Administered 2019-09-06: 22:00:00 5 ug/min via INTRAVENOUS
  Administered 2019-09-07: 05:00:00 15 ug/min via INTRAVENOUS
  Filled 2019-09-06 (×2): qty 250

## 2019-09-06 MED ORDER — DEXTROSE 50 % IV SOLN
INTRAVENOUS | Status: AC
  Administered 2019-09-06: 50 mL
  Filled 2019-09-06: qty 50

## 2019-09-06 MED ORDER — SODIUM CHLORIDE 0.9% FLUSH: 10.0000 mL | INTRAVENOUS | Status: DC | PRN

## 2019-09-06 MED ORDER — VITAMIN K1 10 MG/ML IJ SOLN
10.0000 mg | Freq: Once | INTRAVENOUS | Status: AC
  Administered 2019-09-06: 19:00:00 10 mg via INTRAVENOUS
  Filled 2019-09-06: qty 1

## 2019-09-06 MED ORDER — VANCOMYCIN HCL 1250 MG/250ML IV SOLN
1250.0000 mg | INTRAVENOUS | Status: DC
  Administered 2019-09-06: 13:00:00 1250 mg via INTRAVENOUS
  Filled 2019-09-06 (×2): qty 250

## 2019-09-06 MED ORDER — DEXTROSE 50 % IV SOLN: 25.0000 g | Freq: Once | INTRAVENOUS | Status: AC

## 2019-09-06 NOTE — Progress Notes (Signed)
PHARMACY - PHYSICIAN COMMUNICATION CRITICAL VALUE ALERT - BLOOD CULTURE IDENTIFICATION (BCID)  Charles Blankenship is an 59 y.o. male who presented to [facility] on 09/22/2019 with a chief complaint of AMS, possible sepsis  Assessment:   Blood culture growing Gram positive cocci   Name of physician (or Provider) Contacted:  Dr. Arsenio Loader  Current antibiotics:  Vancomycin and Cefepime and Flagyl  Changes to prescribed antibiotics recommended:   No changes needed.  No results found for this or any previous visit.  [name] 09/06/2019  2:42 AM

## 2019-09-06 NOTE — Progress Notes (Signed)
eLink Physician-Brief Progress Note Patient Name: Charles Blankenship DOB: [birth year] MRN: [id number]   Date of Service  09/06/2019  HPI/Events of Note  Hypoglycemia - Blood glucose = 50. Already given D50 and on a D10 IV infusion.   eICU Interventions  Will order: 1. POC glucose checks Q 1 hour.  2. Increase D10W IV infusion rate to 100 mL/hour.      Intervention Category Major Interventions: Other:  Lenell Antu 09/06/2019, 12:43 AM

## 2019-09-06 NOTE — Progress Notes (Signed)
eLink Physician-Brief Progress Note Patient Name: KEYONTA BARRADAS DOB: 01/20/1961 MRN: [id number]   Date of Service  09/06/2019  HPI/Events of Note  Lactic Acid = 7.3 --> 6.0 - CVP = 18. Hgb = 9.5. BP = 100/54 with MAP of 70 on Norepinephrine IV infusion.   eICU Interventions  Will order: 1. COOX now.      Intervention Category Major Interventions: Other:  Lenell Antu 09/06/2019, 10:40 PM
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
## 2019-09-06 NOTE — Progress Notes (Signed)
Peripherally Inserted Central Catheter/Midline Placement  The IV Nurse has discussed with the patient and/or persons authorized to consent for the patient, the purpose of this procedure and the potential benefits and risks involved with this procedure.  The benefits include less needle sticks, lab draws from the catheter, and the patient may be discharged home with the catheter. Risks include, but not limited to, infection, bleeding, blood clot (thrombus formation), and puncture of an artery; nerve damage and irregular heartbeat and possibility to perform a PICC exchange if needed/ordered by physician.  Alternatives to this procedure were also discussed.  Bard Power PICC patient education guide, fact sheet on infection prevention and patient information card has been provided to patient /or left at bedside.    PICC/Midline Placement Documentation  PICC Single Lumen 10/31/17 PICC Right Brachial 43 cm 0 cm (Active)     PICC Triple Lumen 09/06/19 PICC Left Brachial 45 cm 0 cm (Active)  Indication for Insertion or Continuance of Line Administration of hyperosmolar/irritating solutions (i.e. TPN, Vancomycin, etc.) 09/06/19 2015  Exposed Catheter (cm) 0 cm 09/06/19 2015  Site Assessment Clean;Dry;Intact 09/06/19 2015  Lumen #1 Status Blood return noted;Flushed;Saline locked 09/06/19 2015  Lumen #2 Status Blood return noted;Flushed;Saline locked 09/06/19 2015  Lumen #3 Status Blood return noted;Flushed;Saline locked 09/06/19 2015  Dressing Type Transparent;Occlusive;Securing device 09/06/19 2015  Dressing Status Clean;Dry;Intact;Antimicrobial disc in place 09/06/19 2015  Line Adjustment (NICU/IV Team Only) No 09/06/19 2015  Dressing Intervention New dressing 09/06/19 2015  Dressing Change Due 09/13/19 09/06/19 2015       Christeen Douglas 09/06/2019, 8:32 PM

## 2019-09-06 NOTE — Consult Note (Signed)
WOC Nurse Consult Note: Reason for Consult:Right lateral heel with chronic infectious process, presents with drainage to ED. Left anterior tibial area with blistering lesions.  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 Wound type:Infectious, moisture Pressure Injury POA: N/A Measurement: Left pretibial area with 8cm x 8cm area of blistering, mild elevation of intact blisters and a 2cm x 1cm x 0.1cm unroofed blister (pink, moist) Right lateral heel and posterior heel with evidence of prior surgical procedure and current drainage from pinpoint opening. Wound bed:AS noted above Drainage (amount, consistency, odor) Scat serous form left LE, light yellow from right heel in a small amount Periwound: edematous, erythematous bialterally Dressing procedure/placement/frequency: I will provide bilateral pressure redistribution heel boots and topical care guidance for the two affected areas using xeroform gauze (antimicrobial, nonadherent).  Gerhart's Butt Cream (a compounded 1:1:1 hydrocortisone/lotrimin/zinc oxide product) is provided for fecal incontinence for IAD.  If further exploration of the right heel is indicated, consider orthopedic consultation.  WOC nursing team will not follow, but will remain available to this patient, the nursing and medical teams.  Please re-consult if needed. Thanks, Ladona Mow, MSN, RN, GNP, [place]  Pager# [phone]

## 2019-09-06 NOTE — Progress Notes (Signed)
1610: Spoke w/ Pt's RN Bonita Quin about the order for midline placement. Suggested she speak w/ MD regarding possible PICC or Central line placement due to meds pt. is on like. D10 and Vancomycin.

## 2019-09-06 NOTE — Progress Notes (Signed)
Notified ELink blood glucose dropped to 55 after increase of D10 75 mL/hr to D10 100 mL/hr. 1 Amp dextrose given through IV  and blood glucose increased to 108.  Continue to encourage patient to drink fluids with little success. Requesting increase in rate of D10. Also notified ELink, BP declining, currently 74/57 with MAP 64 and that for ordered MRI to be completed MRI tech states a KUB and CT of the head will be needed prior to MRI exam. Elink RN to notify provider.

## 2019-09-06 NOTE — Telephone Encounter (Signed)
Dr. [name] would like for Dr. Lajoyce Corners to give her a call at [phone].  Stated that Dr. Lajoyce Corners was seeing patient.  Please advise.  Thank you.
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
## 2019-09-06 NOTE — Progress Notes (Signed)
Spoke with daughter Jose Corvin [phone] and updated her on patient's status with patient's consent.

## 2019-09-06 NOTE — Progress Notes (Signed)
In and out cath performed after bladder scan showed retention of 798.

## 2019-09-06 NOTE — Progress Notes (Signed)
Hypoglycemic Event  CBG: 50  Treatment: D50 (25 gm)  Symptoms: None  Follow-up CBG: Time: 2120 CBG Result:163  Possible Reasons for Event:Unknown  Comments/MD notified: ELink notified    Darryl Nestle

## 2019-09-06 NOTE — Progress Notes (Signed)
Appreciate CCM management.  [facility]PT[facility] will be happy to resume care of patient when medically stable and ready to transfer out of ICU.  Dana Allan MD PGY1

## 2019-09-06 NOTE — Progress Notes (Signed)
Hypoglycemic Event  CBG: 55  Treatment: D50 50 mL (25gm) Symptoms: None  Follow-up CBG: Time: 0156 CBG Result:108  Possible Reasons for Event: Unknown Comments/MD notified: Blanca Friend

## 2019-09-06 NOTE — Progress Notes (Signed)
eLink Physician-Brief Progress Note Patient Name: Charles Blankenship DOB: November 10, 1960 MRN: [id number]   Date of Service  09/06/2019  HPI/Events of Note  Multiple issues: 1. Hypoglycemia - Blood glucose = 55. Given D50 and 2. Lactic Acid = 5.2 --> 7.3 Hgb = 11.2.   eICU Interventions  Will order: 1. Increase D10W IV infusion to 125 mL/hour. 2. D/C LR IV infusion. 3. Bolus with 0.9 NaCl 1 liter IV over 1 hour now.      Intervention Category Major Interventions: Other:;Acid-Base disturbance - evaluation and management  [name] Dennard Nip 09/06/2019, 2:36 AM

## 2019-09-06 NOTE — Progress Notes (Signed)
Notified ELink patient has not voided since arrival to unit. Bladder scan shows 798 mL in bladder. Patient states he can't urinate and does not feel like he has to. Will perform in and out cath per protocol.

## 2019-09-06 NOTE — Progress Notes (Signed)
Notified ELink, patient's blood glucose level 50 on arrival to unit, 1 Amp dextrose given with repeat blood sugar 163. D10 infusing at 75 mL/hr and LR infusing at 50 mL/hr.

## 2019-09-06 NOTE — Progress Notes (Addendum)
eLink Physician-Brief Progress Note Patient Name: Charles Blankenship DOB: [birth year] MRN: [id number]   Date of Service  09/06/2019  HPI/Events of Note  Hypotension - BP = 79/64 with MAP = 70. I question the validity of this BP given a 15 mmHg pulse pressure and   eICU Interventions  Will order: 1. Monitor CVP now and Q 4 hours. 2. Place A-line. 3. Norepinephrine IV infusion. Titrate to MAP >= 65. 4. ABG STAT.     Intervention Category Major Interventions: Hypotension - evaluation and management  Advaith Lamarque Dennard Nip 09/06/2019, 9:11 PM

## 2019-09-06 NOTE — Progress Notes (Addendum)
NAME:  Charles Blankenship, MRN:  [id number], DOB:  1961-05-17, LOS: 1 ADMISSION DATE:  09/11/2019, CONSULTATION DATE:  09/20/2019 REFERRING MD: ED, Dr. Johnney Killian CHIEF COMPLAINT:  Septic shock  Brief History   This is a 59 year old gentleman with a history of gout, hypertension, recurrent osteomyelitis and neuropathy who presented on 09/24/2019 evening with several days of decreased oral intake, nausea vomiting and worsening lower extremity pain and swelling.  Consults:  Orthopedic surgery  Procedures:    Significant Diagnostic Tests:    Micro Data:  Blood cultures 10/02/2019 are no growth in 1 set, and gram-positive cocci in chains and the second set.  Antimicrobials:  Vancomycin 102/23/2021 >> Cefepime 102/15/2021 >> Flagyl 102/18/2021 >> Interim history/subjective:  Overnight was still hypotensive, required some fluid resuscitation.  Was also still hypoglycemic and required an up titration of his D10 drip.  His morning he is alert and appears oriented and is following commands, but nursing does note that he has waxing and waning mental status and alertness.  Objective   Blood pressure (!) 72/59, pulse (!) 118, temperature 97.6 F (36.4 C), temperature source Oral, resp. rate 20, height 5\' 11"  (1.803 m), weight 77.5 kg, SpO2 100 %.        Intake/Output Summary (Last 24 hours) at 09/06/2019 0915 Last data filed at 09/06/2019 0800 Gross per 24 hour  Intake 6990.67 ml  Output 720 ml  Net 6270.67 ml   Filed Weights   09/16/2019 1349 09/08/2019 2044 09/06/19 0427  Weight: 93 kg 77.5 kg 77.5 kg    Examination: General: resting comfortably, no distress HENT: right eye enucleation Lungs: clear to asucltation bilaterally no distress on room air Cardiovascular: tachycardic, regular Abdomen: soft, nontender Extremities: bilateral pitting LE edema with erythema just below the knees. Right heel ulceration Neuro: AOx3, folows commands normal speech MSK: left upper extremity MCP joints still red but  less warm than yesterday Lines: PIV.   Assessment & Plan:  59 year old gentleman with history of hypertension and chronic osteomyelitis of the left foot who presents with:  Acute encephalopathy, now slowly improving Severe sepsis, likely secondary to skin and soft tissue infection Acute gout attack, suspected in the left upper extremity Lactic acidosis Refractory hypoglycemia Right Calcaneal Osteomyelitis Gram-positive cocci in chains bacteremia, suspect contaminant Suspected Cirrhosis (hypoalbuminemia, elevated INR, failure to clear lactate, thrombocytopenia.) Acute urinary retention - foley placed this afternoon  Patient is currently on a D10 drip for his refractory hypoglycemia.  Will advance diet as tolerated. He needed an increase in his D10 overnight so will need to monitor oral intake before titrating down.   We will continue vancomycin, cefepime, Flagyl empirically for MRSA, gram-negative, and anaerobic organisms which could precipitate his severe sepsis response in the setting of osteomyelitis.    On prednisone for gout 40 mg daily resume colchicine.   Discussed with Dr. Sharol Given. Plan is for OR Wednesday 2/3. Will give IV vitamin K for his INR. Additional blood products per surgical team.   VAT team to place PICC.  Best practice:  Diet: regular diet, will advance as tolerated and try to taper D10 drip.  Pain/Anxiety/Delirium protocol (if indicated): prn pain control DVT prophylaxis: [facility] GI prophylaxis: n/a Glucose control: on D10 drip as above Foley none Mobility: with assist Code Status: Full Disposition: needs ICU due to hypoglycemia, impending shock  Labs   CBC: Recent Labs  Lab 09/18/2019 1303 09/06/19 0330  WBC 10.9* 5.6  NEUTROABS 9.7*  --   HGB 11.2* 9.2*  HCT 33.6*  27.2*  MCV 75.0* 73.9*  PLT 133* 80*    Basic Metabolic Panel: Recent Labs  Lab 2019/09/29 1303 09/06/19 0330  NA 132* 130*  K 4.4 4.6  CL 98 101  CO2 16* 13*  GLUCOSE 38* 139*  BUN  46* 50*  CREATININE 1.60* 1.58*  CALCIUM 8.4* 7.5*  MG 1.9 1.7  PHOS 4.2  --    GFR: Estimated Creatinine Clearance: 54.3 mL/min (A) (by C-G formula based on SCr of 1.58 mg/dL (H)). Recent Labs  Lab 09-29-2019 1303 2019/09/29 1303 09-29-19 1833 2019-09-29 2121 09/06/19 0040 09/06/19 0330  WBC 10.9*  --   --   --   --  5.6  LATICACIDVEN 4.1*   < > 4.8* 5.2* 7.3* 7.3*   < > = values in this interval not displayed.    Liver Function Tests: Recent Labs  Lab 2019/09/29 1303 09/06/19 0330  AST 137* 97*  ALT 92* 68*  ALKPHOS 144* 98  BILITOT 6.0* 5.8*  PROT 7.1 5.5*  ALBUMIN 1.5* 1.0*   No results for input(s): LIPASE, AMYLASE in the last 168 hours. Recent Labs  Lab 09/29/19 1341  AMMONIA 19    ABG No results found for: PHART, PCO2ART, PO2ART, HCO3, TCO2, ACIDBASEDEF, O2SAT   Coagulation Profile: Recent Labs  Lab 2019/09/29 1303 09/06/19 0330  INR 1.9* 2.2*    Cardiac Enzymes: No results for input(s): CKTOTAL, CKMB, CKMBINDEX, TROPONINI in the last 168 hours.  HbA1C: No results found for: HGBA1C  CBG: Recent Labs  Lab 09/06/19 0156 09/06/19 0351 09/06/19 0515 09/06/19 0632 09/06/19 0736  GLUCAP 108* 131* 147* 128* 149*    Critical care time:   The patient is critically ill with multiple organ systems failure and requires high complexity decision making for assessment and support, frequent evaluation and titration of therapies, application of advanced monitoring technologies and extensive interpretation of multiple databases.   Critical Care Time devoted to patient care services described in this note is 54 minutes. This time reflects the time of my personal involvement. This critical care time does not reflect separately billable procedures or procedure time, teaching time or supervisory time of PA/NP/Med student/Med Resident etc but could involve care discussion time.  Mickel Baas Pulmonary and Critical Care Medicine 09/06/2019 9:15 AM  Pager:  [phone] After hours pager: [phone]

## 2019-09-06 NOTE — Procedures (Signed)
Arterial Catheter Insertion Procedure Note GLORIA LAMBERTSON [id number] 1960/11/19  Procedure: Insertion of Arterial Catheter  Indications: Blood pressure monitoring and Frequent blood sampling  Procedure Details Consent: Unable to obtain consent because of altered level of consciousness. Time Out: Verified patient identification, verified procedure, site/side was marked, verified correct patient position, special equipment/implants available, medications/allergies/relevent history reviewed, required imaging and test results available.  Performed  Maximum sterile technique was used including antiseptics, cap, gloves, gown, hand hygiene, mask and sheet. Skin prep: Chlorhexidine; local anesthetic administered 20 gauge catheter was inserted into left radial artery using the Seldinger technique. ULTRASOUND GUIDANCE USED: NO Evaluation Blood flow good; BP tracing good. Complications: No apparent complications.   Hassan Buckler 09/06/2019

## 2019-09-06 NOTE — Progress Notes (Signed)
Pharmacy Antibiotic Note  Charles Blankenship is a 59 y.o. male admitted on 09/24/2019 with sepsis.  R-foot infection with drainage/redness up leg.  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  Pharmacy has been consulted for vancomycin and cefepime dosing.  Plan: Vancomycin 2000mg  IV x 1, then 1250 mg IV every 24 hours Goal AUC 400-550. Expected AUC: 453 SCr used: 1.58 Cefepime 2g IV every 12 hours Monitor renal function, Cx and clinical progression to narrow Vancomycin levels at steady state  Height: 5\' 11"  (180.3 cm) Weight: 170 lb 13.7 oz (77.5 kg) IBW/kg (Calculated) : 75.3  Temp (24hrs), Avg:97.4 F (36.3 C), Min:96.5 F (35.8 C), Max:97.6 F (36.4 C)  Recent Labs  Lab 10/02/2019 1303 09/19/2019 1833 09/25/2019 2121 09/06/19 0040 09/06/19 0330  WBC 10.9*  --   --   --  5.6  CREATININE 1.60*  --   --   --  1.58*  LATICACIDVEN 4.1* 4.8* 5.2* 7.3* 7.3*    Estimated Creatinine Clearance: 54.3 mL/min (A) (by C-G formula based on SCr of 1.58 mg/dL (H)).    No Known Allergies  11/04/19, PharmD PGY2 Infectious Disease Pharmacy Resident  Please check AMION for all [facility] Pharmacy numbers 09/06/2019 1:02 PM

## 2019-09-06 NOTE — Progress Notes (Signed)
eLink Physician-Brief Progress Note Patient Name: Charles Blankenship DOB: 1960-09-08 MRN: [id number]   Date of Service  09/06/2019  HPI/Events of Note  Multiple issues: 1. Mgg++ = 1.7 and 2. Lactic Acid = 7.3 --> 7.3.  eICU Interventions  Will order: 1. Replace Mg++. 2. Monitor CVP now and Q 4 hours.      Intervention Category Major Interventions: Electrolyte abnormality - evaluation and management;Acid-Base disturbance - evaluation and management  Arvella Massingale Eugene 09/06/2019, 5:34 AM

## 2019-09-06 NOTE — Telephone Encounter (Signed)
Dr. Lajoyce Corners called

## 2019-09-07 ENCOUNTER — Inpatient Hospital Stay (HOSPITAL_COMMUNITY): Payer: Medicaid Other

## 2019-09-07 ENCOUNTER — Encounter (HOSPITAL_COMMUNITY): Payer: Self-pay | Admitting: Internal Medicine

## 2019-09-07 ENCOUNTER — Encounter (HOSPITAL_COMMUNITY): Admission: EM | Disposition: E | Payer: Self-pay | Source: Home / Self Care | Attending: Internal Medicine

## 2019-09-07 ENCOUNTER — Other Ambulatory Visit (HOSPITAL_COMMUNITY): Payer: Medicaid Other

## 2019-09-07 DIAGNOSIS — I469 Cardiac arrest, cause unspecified: Secondary | ICD-10-CM

## 2019-09-07 DIAGNOSIS — B9561 Methicillin susceptible Staphylococcus aureus infection as the cause of diseases classified elsewhere: Secondary | ICD-10-CM

## 2019-09-07 DIAGNOSIS — Z96641 Presence of right artificial hip joint: Secondary | ICD-10-CM

## 2019-09-07 DIAGNOSIS — I420 Dilated cardiomyopathy: Secondary | ICD-10-CM

## 2019-09-07 DIAGNOSIS — M86171 Other acute osteomyelitis, right ankle and foot: Secondary | ICD-10-CM

## 2019-09-07 DIAGNOSIS — R7881 Bacteremia: Secondary | ICD-10-CM

## 2019-09-07 DIAGNOSIS — Z8674 Personal history of sudden cardiac arrest: Secondary | ICD-10-CM

## 2019-09-07 DIAGNOSIS — Z87891 Personal history of nicotine dependence: Secondary | ICD-10-CM

## 2019-09-07 DIAGNOSIS — M86671 Other chronic osteomyelitis, right ankle and foot: Secondary | ICD-10-CM

## 2019-09-07 DIAGNOSIS — B954 Other streptococcus as the cause of diseases classified elsewhere: Secondary | ICD-10-CM

## 2019-09-07 DIAGNOSIS — Z8781 Personal history of (healed) traumatic fracture: Secondary | ICD-10-CM

## 2019-09-07 DIAGNOSIS — R570 Cardiogenic shock: Secondary | ICD-10-CM

## 2019-09-07 DIAGNOSIS — Z9911 Dependence on respirator [ventilator] status: Secondary | ICD-10-CM

## 2019-09-07 DIAGNOSIS — A4101 Sepsis due to Methicillin susceptible Staphylococcus aureus: Principal | ICD-10-CM

## 2019-09-07 DIAGNOSIS — G9341 Metabolic encephalopathy: Secondary | ICD-10-CM

## 2019-09-07 DIAGNOSIS — M109 Gout, unspecified: Secondary | ICD-10-CM

## 2019-09-07 DIAGNOSIS — I10 Essential (primary) hypertension: Secondary | ICD-10-CM

## 2019-09-07 DIAGNOSIS — F1021 Alcohol dependence, in remission: Secondary | ICD-10-CM

## 2019-09-07 HISTORY — DX: Bacteremia: R78.81

## 2019-09-07 HISTORY — DX: Bacteremia: B95.61

## 2019-09-07 LAB — POCT I-STAT 7, (LYTES, BLD GAS, ICA,H+H)
Acid-base deficit: 12 mmol/L — ABNORMAL HIGH (ref 0.0–2.0)
Acid-base deficit: 13 mmol/L — ABNORMAL HIGH (ref 0.0–2.0)
Acid-base deficit: 13 mmol/L — ABNORMAL HIGH (ref 0.0–2.0)
Acid-base deficit: 15 mmol/L — ABNORMAL HIGH (ref 0.0–2.0)
Bicarbonate: 11.4 mmol/L — ABNORMAL LOW (ref 20.0–28.0)
Bicarbonate: 15.7 mmol/L — ABNORMAL LOW (ref 20.0–28.0)
Bicarbonate: 14.6 mmol/L — ABNORMAL LOW (ref 20.0–28.0)
Bicarbonate: 11.8 mmol/L — ABNORMAL LOW (ref 20.0–28.0)
Calcium, Ion: 1.07 mmol/L — ABNORMAL LOW (ref 1.15–1.40)
Calcium, Ion: 0.97 mmol/L — ABNORMAL LOW (ref 1.15–1.40)
Calcium, Ion: 1.13 mmol/L — ABNORMAL LOW (ref 1.15–1.40)
Calcium, Ion: 1.09 mmol/L — ABNORMAL LOW (ref 1.15–1.40)
HCT: 29 % — ABNORMAL LOW (ref 39.0–52.0)
HCT: 26 % — ABNORMAL LOW (ref 39.0–52.0)
HCT: 26 % — ABNORMAL LOW (ref 39.0–52.0)
HCT: 28 % — ABNORMAL LOW (ref 39.0–52.0)
Hemoglobin: 9.9 g/dL — ABNORMAL LOW (ref 13.0–17.0)
Hemoglobin: 8.8 g/dL — ABNORMAL LOW (ref 13.0–17.0)
Hemoglobin: 8.8 g/dL — ABNORMAL LOW (ref 13.0–17.0)
Hemoglobin: 9.5 g/dL — ABNORMAL LOW (ref 13.0–17.0)
O2 Saturation: 99 %
O2 Saturation: 100 %
O2 Saturation: 100 %
O2 Saturation: 100 %
Patient temperature: 36
Patient temperature: 97.5
Patient temperature: 97.5
Potassium: 4.8 mmol/L (ref 3.5–5.1)
Potassium: 5.1 mmol/L (ref 3.5–5.1)
Potassium: 4.6 mmol/L (ref 3.5–5.1)
Potassium: 4.7 mmol/L (ref 3.5–5.1)
Sodium: 127 mmol/L — ABNORMAL LOW (ref 135–145)
Sodium: 130 mmol/L — ABNORMAL LOW (ref 135–145)
Sodium: 128 mmol/L — ABNORMAL LOW (ref 135–145)
Sodium: 128 mmol/L — ABNORMAL LOW (ref 135–145)
TCO2: 12 mmol/L — ABNORMAL LOW (ref 22–32)
TCO2: 17 mmol/L — ABNORMAL LOW (ref 22–32)
TCO2: 16 mmol/L — ABNORMAL LOW (ref 22–32)
TCO2: 13 mmol/L — ABNORMAL LOW (ref 22–32)
pCO2 arterial: 19.3 mmHg — CL (ref 32.0–48.0)
pCO2 arterial: 47.1 mmHg (ref 32.0–48.0)
pCO2 arterial: 40.8 mmHg (ref 32.0–48.0)
pCO2 arterial: 30 mmHg — ABNORMAL LOW (ref 32.0–48.0)
pH, Arterial: 7.379 (ref 7.350–7.450)
pH, Arterial: 7.126 — CL (ref 7.350–7.450)
pH, Arterial: 7.158 — CL (ref 7.350–7.450)
pH, Arterial: 7.198 — CL (ref 7.350–7.450)
pO2, Arterial: 127 mmHg — ABNORMAL HIGH (ref 83.0–108.0)
pO2, Arterial: 365 mmHg — ABNORMAL HIGH (ref 83.0–108.0)
pO2, Arterial: 407 mmHg — ABNORMAL HIGH (ref 83.0–108.0)
pO2, Arterial: 257 mmHg — ABNORMAL HIGH (ref 83.0–108.0)

## 2019-09-07 LAB — GLUCOSE, CAPILLARY
Glucose-Capillary: 105 mg/dL — ABNORMAL HIGH (ref 70–99)
Glucose-Capillary: 114 mg/dL — ABNORMAL HIGH (ref 70–99)
Glucose-Capillary: 114 mg/dL — ABNORMAL HIGH (ref 70–99)
Glucose-Capillary: 116 mg/dL — ABNORMAL HIGH (ref 70–99)
Glucose-Capillary: 121 mg/dL — ABNORMAL HIGH (ref 70–99)
Glucose-Capillary: 128 mg/dL — ABNORMAL HIGH (ref 70–99)
Glucose-Capillary: 130 mg/dL — ABNORMAL HIGH (ref 70–99)
Glucose-Capillary: 137 mg/dL — ABNORMAL HIGH (ref 70–99)
Glucose-Capillary: 145 mg/dL — ABNORMAL HIGH (ref 70–99)
Glucose-Capillary: 32 mg/dL — CL (ref 70–99)
Glucose-Capillary: 61 mg/dL — ABNORMAL LOW (ref 70–99)
Glucose-Capillary: 99 mg/dL (ref 70–99)

## 2019-09-07 LAB — BLOOD CULTURE ID PANEL (REFLEXED)

## 2019-09-07 LAB — ECHOCARDIOGRAM COMPLETE
Height: 71 in
Weight: 2991.2 oz

## 2019-09-07 LAB — COMPREHENSIVE METABOLIC PANEL
ALT: 73 U/L — ABNORMAL HIGH (ref 0–44)
AST: 112 U/L — ABNORMAL HIGH (ref 15–41)
Albumin: 1.5 g/dL — ABNORMAL LOW (ref 3.5–5.0)
Alkaline Phosphatase: 109 U/L (ref 38–126)
Anion gap: 18 — ABNORMAL HIGH (ref 5–15)
BUN: 63 mg/dL — ABNORMAL HIGH (ref 6–20)
CO2: 13 mmol/L — ABNORMAL LOW (ref 22–32)
Calcium: 8.2 mg/dL — ABNORMAL LOW (ref 8.9–10.3)
Chloride: 99 mmol/L (ref 98–111)
Creatinine, Ser: 2.17 mg/dL — ABNORMAL HIGH (ref 0.61–1.24)
GFR calc Af Amer: 38 mL/min — ABNORMAL LOW (ref >60)
GFR calc non Af Amer: 32 mL/min — ABNORMAL LOW (ref >60)
Glucose, Bld: 34 mg/dL — CL (ref 70–99)
Potassium: 4.8 mmol/L (ref 3.5–5.1)
Sodium: 130 mmol/L — ABNORMAL LOW (ref 135–145)
Total Bilirubin: 7.5 mg/dL — ABNORMAL HIGH (ref 0.3–1.2)
Total Protein: 6 g/dL — ABNORMAL LOW (ref 6.5–8.1)

## 2019-09-07 LAB — CBC
HCT: 26.1 % — ABNORMAL LOW (ref 39.0–52.0)
HCT: 24.5 % — ABNORMAL LOW (ref 39.0–52.0)
Hemoglobin: 9 g/dL — ABNORMAL LOW (ref 13.0–17.0)
Hemoglobin: 8.1 g/dL — ABNORMAL LOW (ref 13.0–17.0)
MCH: 24.9 pg — ABNORMAL LOW (ref 26.0–34.0)
MCH: 25 pg — ABNORMAL LOW (ref 26.0–34.0)
MCHC: 34.5 g/dL (ref 30.0–36.0)
MCHC: 33.1 g/dL (ref 30.0–36.0)
MCV: 72.3 fL — ABNORMAL LOW (ref 80.0–100.0)
MCV: 75.6 fL — ABNORMAL LOW (ref 80.0–100.0)
Platelets: 92 10*3/uL — ABNORMAL LOW (ref 150–400)
Platelets: 113 10*3/uL — ABNORMAL LOW (ref 150–400)
RBC: 3.61 MIL/uL — ABNORMAL LOW (ref 4.22–5.81)
RBC: 3.24 MIL/uL — ABNORMAL LOW (ref 4.22–5.81)
RDW: 18.8 % — ABNORMAL HIGH (ref 11.5–15.5)
RDW: 19.1 % — ABNORMAL HIGH (ref 11.5–15.5)
WBC: 9.2 10*3/uL (ref 4.0–10.5)
WBC: 9.3 10*3/uL (ref 4.0–10.5)
nRBC: 0.4 % — ABNORMAL HIGH (ref 0.0–0.2)
nRBC: 0.9 % — ABNORMAL HIGH (ref 0.0–0.2)

## 2019-09-07 LAB — BASIC METABOLIC PANEL
Anion gap: 16 — ABNORMAL HIGH (ref 5–15)
BUN: 61 mg/dL — ABNORMAL HIGH (ref 6–20)
CO2: 12 mmol/L — ABNORMAL LOW (ref 22–32)
Calcium: 7.5 mg/dL — ABNORMAL LOW (ref 8.9–10.3)
Chloride: 98 mmol/L (ref 98–111)
Creatinine, Ser: 1.9 mg/dL — ABNORMAL HIGH (ref 0.61–1.24)
GFR calc Af Amer: 44 mL/min — ABNORMAL LOW (ref >60)
GFR calc non Af Amer: 38 mL/min — ABNORMAL LOW (ref >60)
Glucose, Bld: 121 mg/dL — ABNORMAL HIGH (ref 70–99)
Potassium: 4.6 mmol/L (ref 3.5–5.1)
Sodium: 126 mmol/L — ABNORMAL LOW (ref 135–145)

## 2019-09-07 LAB — PREPARE PLATELET PHERESIS: Unit division: 0

## 2019-09-07 LAB — URINE CULTURE: Culture: >=100000 — AB

## 2019-09-07 LAB — TROPONIN I (HIGH SENSITIVITY)
Troponin I (High Sensitivity): 39 ng/L — ABNORMAL HIGH (ref <18)
Troponin I (High Sensitivity): 44 ng/L — ABNORMAL HIGH (ref <18)

## 2019-09-07 LAB — COOXEMETRY PANEL
Carboxyhemoglobin: 0.8 % (ref 0.5–1.5)
Methemoglobin: 0.8 % (ref 0.0–1.5)
O2 Saturation: 47.4 %
Total hemoglobin: 9.3 g/dL — ABNORMAL LOW (ref 12.0–16.0)

## 2019-09-07 LAB — BPAM PLATELET PHERESIS
Blood Product Expiration Date: 202102022359
ISSUE DATE / TIME: 202102022109
Unit Type and Rh: 600

## 2019-09-07 LAB — LACTIC ACID, PLASMA: Lactic Acid, Venous: 6.4 mmol/L (ref 0.5–1.9)

## 2019-09-07 LAB — SURGICAL PCR SCREEN
MRSA, PCR: NEGATIVE
Staphylococcus aureus: POSITIVE — AB

## 2019-09-07 LAB — PROTIME-INR
INR: 2.2 — ABNORMAL HIGH (ref 0.8–1.2)
Prothrombin Time: 24.3 seconds — ABNORMAL HIGH (ref 11.4–15.2)

## 2019-09-07 SURGERY — CANCELLED PROCEDURE
Anesthesia: General

## 2019-09-07 MED ORDER — ONDANSETRON HCL 4 MG/2ML IJ SOLN
INTRAMUSCULAR | Status: AC
  Filled 2019-09-07: qty 2

## 2019-09-07 MED ORDER — CALCIUM CHLORIDE 10 % IV SOLN
INTRAVENOUS | Status: DC | PRN
  Administered 2019-09-07: 1 g via INTRAVENOUS

## 2019-09-07 MED ORDER — SODIUM CHLORIDE (PF) 0.9 % IJ SOLN
INTRAMUSCULAR | Status: AC
  Filled 2019-09-07: qty 20

## 2019-09-07 MED ORDER — SUCCINYLCHOLINE CHLORIDE 200 MG/10ML IV SOSY
PREFILLED_SYRINGE | INTRAVENOUS | Status: DC | PRN
  Administered 2019-09-07: 100 mg via INTRAVENOUS

## 2019-09-07 MED ORDER — SODIUM BICARBONATE 8.4 % IV SOLN
INTRAVENOUS | Status: DC | PRN
  Administered 2019-09-07 (×3): 50 meq via INTRAVENOUS

## 2019-09-07 MED ORDER — PHENYLEPHRINE 40 MCG/ML (10ML) SYRINGE FOR IV PUSH (FOR BLOOD PRESSURE SUPPORT)
PREFILLED_SYRINGE | INTRAVENOUS | Status: AC
  Filled 2019-09-07: qty 10

## 2019-09-07 MED ORDER — PHENYLEPHRINE HCL-NACL 10-0.9 MG/250ML-% IV SOLN
INTRAVENOUS | Status: DC | PRN
  Administered 2019-09-07: 300 ug/min via INTRAVENOUS

## 2019-09-07 MED ORDER — CHLORHEXIDINE GLUCONATE 4 % EX LIQD
60.0000 mL | Freq: Once | CUTANEOUS | Status: AC
  Administered 2019-09-07: 4 via TOPICAL
  Filled 2019-09-07: qty 60

## 2019-09-07 MED ORDER — SODIUM CHLORIDE 0.9% IV SOLUTION: Freq: Once | INTRAVENOUS | Status: DC

## 2019-09-07 MED ORDER — ETOMIDATE 2 MG/ML IV SOLN
INTRAVENOUS | Status: DC | PRN
  Administered 2019-09-07: 10 mg via INTRAVENOUS

## 2019-09-07 MED ORDER — DOBUTAMINE IN D5W 4-5 MG/ML-% IV SOLN
5.0000 ug/kg/min | INTRAVENOUS | Status: DC
  Administered 2019-09-07: 2.5 ug/kg/min via INTRAVENOUS
  Filled 2019-09-07: qty 250

## 2019-09-07 MED ORDER — VASOPRESSIN 20 UNIT/ML IV SOLN
INTRAVENOUS | Status: AC
  Filled 2019-09-07: qty 1

## 2019-09-07 MED ORDER — ORAL CARE MOUTH RINSE
15.0000 mL | OROMUCOSAL | Status: DC
  Administered 2019-09-07 – 2019-09-10 (×24): 15 mL via OROMUCOSAL

## 2019-09-07 MED ORDER — PROPOFOL 10 MG/ML IV BOLUS
INTRAVENOUS | Status: AC
  Filled 2019-09-07: qty 20

## 2019-09-07 MED ORDER — VANCOMYCIN HCL 500 MG/100ML IV SOLN
500.0000 mg | Freq: Once | INTRAVENOUS | Status: AC
  Administered 2019-09-07: 500 mg via INTRAVENOUS
  Filled 2019-09-07: qty 100

## 2019-09-07 MED ORDER — VITAL 1.5 CAL PO LIQD
1000.0000 mL | ORAL | Status: DC
  Administered 2019-09-07: 1000 mL
  Filled 2019-09-07 (×4): qty 1000

## 2019-09-07 MED ORDER — DOBUTAMINE IN D5W 4-5 MG/ML-% IV SOLN
5.0000 ug/kg/min | INTRAVENOUS | Status: DC
  Administered 2019-09-07: 2.5 ug/kg/min via INTRAVENOUS
  Administered 2019-09-09: 5 ug/kg/min via INTRAVENOUS
  Filled 2019-09-07 (×3): qty 250

## 2019-09-07 MED ORDER — PRO-STAT SUGAR FREE PO LIQD
30.0000 mL | Freq: Four times a day (QID) | ORAL | Status: DC
  Administered 2019-09-07 – 2019-09-08 (×5): 30 mL
  Filled 2019-09-07 (×6): qty 30

## 2019-09-07 MED ORDER — ETOMIDATE 2 MG/ML IV SOLN
INTRAVENOUS | Status: AC
  Filled 2019-09-07: qty 10

## 2019-09-07 MED ORDER — NOREPINEPHRINE 4 MG/250ML-% IV SOLN
INTRAVENOUS | Status: DC | PRN
  Administered 2019-09-07: 50 ug/kg/min via INTRAVENOUS

## 2019-09-07 MED ORDER — MIDAZOLAM HCL 2 MG/2ML IJ SOLN
INTRAMUSCULAR | Status: DC | PRN
  Administered 2019-09-07: 2 mg via INTRAVENOUS

## 2019-09-07 MED ORDER — LACTATED RINGERS IV SOLN: INTRAVENOUS | Status: DC | PRN

## 2019-09-07 MED ORDER — CHLORHEXIDINE GLUCONATE CLOTH 2 % EX PADS
6.0000 | MEDICATED_PAD | Freq: Every day | CUTANEOUS | Status: DC
  Administered 2019-09-07 – 2019-09-08 (×2): 6 via TOPICAL

## 2019-09-07 MED ORDER — SODIUM CHLORIDE 0.9 % IV SOLN: 2.0000 g | INTRAVENOUS | Status: DC

## 2019-09-07 MED ORDER — CEFAZOLIN SODIUM-DEXTROSE 2-4 GM/100ML-% IV SOLN
2.0000 g | Freq: Three times a day (TID) | INTRAVENOUS | Status: DC
  Administered 2019-09-07 – 2019-09-08 (×2): 2 g via INTRAVENOUS
  Filled 2019-09-07 (×4): qty 100

## 2019-09-07 MED ORDER — LIDOCAINE 2% (20 MG/ML) 5 ML SYRINGE
INTRAMUSCULAR | Status: AC
  Filled 2019-09-07: qty 10

## 2019-09-07 MED ORDER — DEXMEDETOMIDINE HCL IN NACL 200 MCG/50ML IV SOLN
INTRAVENOUS | Status: AC
  Filled 2019-09-07: qty 50

## 2019-09-07 MED ORDER — ROCURONIUM BROMIDE 10 MG/ML (PF) SYRINGE
PREFILLED_SYRINGE | INTRAVENOUS | Status: DC | PRN
  Administered 2019-09-07: 100 mg via INTRAVENOUS

## 2019-09-07 MED ORDER — ATROPINE SULFATE 0.4 MG/ML IJ SOLN
INTRAMUSCULAR | Status: DC | PRN
  Administered 2019-09-07: 1.2 mg via INTRAVENOUS

## 2019-09-07 MED ORDER — FENTANYL CITRATE (PF) 250 MCG/5ML IJ SOLN
INTRAMUSCULAR | Status: AC
  Filled 2019-09-07: qty 5

## 2019-09-07 MED ORDER — SODIUM BICARBONATE-DEXTROSE 150-5 MEQ/L-% IV SOLN
150.0000 meq | INTRAVENOUS | Status: DC
  Administered 2019-09-07 – 2019-09-09 (×4): 150 meq via INTRAVENOUS
  Filled 2019-09-07 (×6): qty 1000

## 2019-09-07 MED ORDER — NOREPINEPHRINE 16 MG/250ML-% IV SOLN
0.0000 ug/min | INTRAVENOUS | Status: DC
  Administered 2019-09-07: 09:00:00 20 ug/min via INTRAVENOUS
  Administered 2019-09-07 – 2019-09-08 (×2): 22 ug/min via INTRAVENOUS
  Administered 2019-09-08: 19:00:00 38 ug/min via INTRAVENOUS
  Administered 2019-09-09: 32 ug/min via INTRAVENOUS
  Administered 2019-09-09: 20:00:00 65 ug/min via INTRAVENOUS
  Administered 2019-09-09: 52 ug/min via INTRAVENOUS
  Administered 2019-09-10: 60 ug/min via INTRAVENOUS
  Filled 2019-09-07 (×9): qty 250

## 2019-09-07 MED ORDER — VASOPRESSIN 20 UNIT/ML IV SOLN
0.0300 [IU]/min | INTRAVENOUS | Status: AC
  Administered 2019-09-07: 0.03 [IU]/min via INTRAVENOUS
  Filled 2019-09-07: qty 2

## 2019-09-07 MED ORDER — EPINEPHRINE HCL 5 MG/250ML IV SOLN IN NS
INTRAVENOUS | Status: DC | PRN
  Administered 2019-09-07: 10 ug/kg/min via INTRAVENOUS

## 2019-09-07 MED ORDER — DEXTROSE 50 % IV SOLN
25.0000 g | Freq: Once | INTRAVENOUS | Status: AC
  Administered 2019-09-07: 25 g via INTRAVENOUS
  Filled 2019-09-07: qty 50

## 2019-09-07 MED ORDER — VASOPRESSIN 20 UNIT/ML IV SOLN
INTRAVENOUS | Status: DC | PRN
  Administered 2019-09-07: .03 m[IU]/kg/h via INTRAVENOUS

## 2019-09-07 MED ORDER — MUPIROCIN 2 % EX OINT
1.0000 "application " | TOPICAL_OINTMENT | Freq: Two times a day (BID) | CUTANEOUS | Status: DC
  Administered 2019-09-07 – 2019-09-09 (×7): 1 via NASAL

## 2019-09-07 MED ORDER — CHLORHEXIDINE GLUCONATE 0.12% ORAL RINSE (MEDLINE KIT)
15.0000 mL | Freq: Two times a day (BID) | OROMUCOSAL | Status: DC
  Administered 2019-09-07 – 2019-09-09 (×6): 15 mL via OROMUCOSAL

## 2019-09-07 MED ORDER — ALBUMIN HUMAN 5 % IV SOLN: INTRAVENOUS | Status: DC | PRN

## 2019-09-07 MED ORDER — EPINEPHRINE 1 MG/10ML IJ SOSY
PREFILLED_SYRINGE | INTRAMUSCULAR | Status: AC
  Filled 2019-09-07: qty 20

## 2019-09-07 MED ORDER — SODIUM CHLORIDE 0.9 % IV SOLN
INTRAVENOUS | Status: DC | PRN
  Administered 2019-09-07: 03:00:00 250 mL via INTRAVENOUS

## 2019-09-07 MED ORDER — VASOPRESSIN 20 UNIT/ML IV SOLN
INTRAVENOUS | Status: DC | PRN
  Administered 2019-09-07: 5 m[IU] via INTRAVENOUS
  Administered 2019-09-07: 1 m[IU] via INTRAVENOUS
  Administered 2019-09-07 (×2): 2 m[IU] via INTRAVENOUS

## 2019-09-07 MED ORDER — DEXMEDETOMIDINE HCL IN NACL 200 MCG/50ML IV SOLN
INTRAVENOUS | Status: DC | PRN
  Administered 2019-09-07: .5 ug/kg/h via INTRAVENOUS

## 2019-09-07 MED ORDER — CEFAZOLIN SODIUM-DEXTROSE 2-4 GM/100ML-% IV SOLN
2.0000 g | INTRAVENOUS | Status: DC
  Filled 2019-09-07: qty 100

## 2019-09-07 MED ORDER — DEXAMETHASONE SODIUM PHOSPHATE 10 MG/ML IJ SOLN
INTRAMUSCULAR | Status: AC
  Filled 2019-09-07: qty 1

## 2019-09-07 MED ORDER — EPINEPHRINE PF 1 MG/ML IJ SOLN
INTRAMUSCULAR | Status: DC | PRN
  Administered 2019-09-07 (×2): 1 mg via INTRAVENOUS

## 2019-09-07 MED ORDER — MIDAZOLAM HCL 2 MG/2ML IJ SOLN
INTRAMUSCULAR | Status: AC
  Filled 2019-09-07: qty 2

## 2019-09-07 SURGICAL SUPPLY — 38 items
BLADE SAW RECIP 87.9 MT (BLADE) ×4 IMPLANT
BLADE SURG 21 STRL SS (BLADE) ×4 IMPLANT
BNDG COHESIVE 6X5 TAN STRL LF (GAUZE/BANDAGES/DRESSINGS) IMPLANT
CANISTER WOUND CARE 500ML ATS (WOUND CARE) ×4 IMPLANT
COVER SURGICAL LIGHT HANDLE (MISCELLANEOUS) ×4 IMPLANT
COVER WAND RF STERILE (DRAPES) IMPLANT
CUFF TOURN SGL QUICK 34 (TOURNIQUET CUFF) ×2
CUFF TRNQT CYL 34X4.125X (TOURNIQUET CUFF) ×2 IMPLANT
DRAPE INCISE IOBAN 66X45 STRL (DRAPES) ×4 IMPLANT
DRAPE U-SHAPE 47X51 STRL (DRAPES) ×4 IMPLANT
DRESSING PREVENA PLUS CUSTOM (GAUZE/BANDAGES/DRESSINGS) ×2 IMPLANT
DRSG PREVENA PLUS CUSTOM (GAUZE/BANDAGES/DRESSINGS) ×3
DURAPREP 26ML APPLICATOR (WOUND CARE) ×4 IMPLANT
ELECT REM PT RETURN 9FT ADLT (ELECTROSURGICAL) ×3
ELECTRODE REM PT RTRN 9FT ADLT (ELECTROSURGICAL) ×2 IMPLANT
GLOVE BIOGEL PI IND STRL 9 (GLOVE) ×2 IMPLANT
GLOVE BIOGEL PI INDICATOR 9 (GLOVE) ×2
GLOVE SURG ORTHO 9.0 STRL STRW (GLOVE) ×4 IMPLANT
GOWN STRL REUS W/ TWL XL LVL3 (GOWN DISPOSABLE) ×4 IMPLANT
GOWN STRL REUS W/TWL XL LVL3 (GOWN DISPOSABLE) ×4
KIT BASIN OR (CUSTOM PROCEDURE TRAY) ×4 IMPLANT
KIT TURNOVER KIT B (KITS) ×4 IMPLANT
MANIFOLD NEPTUNE II (INSTRUMENTS) ×4 IMPLANT
NS IRRIG 1000ML POUR BTL (IV SOLUTION) ×4 IMPLANT
PACK ORTHO EXTREMITY (CUSTOM PROCEDURE TRAY) ×4 IMPLANT
PAD ARMBOARD 7.5X6 YLW CONV (MISCELLANEOUS) ×4 IMPLANT
PREVENA RESTOR ARTHOFORM 46X30 (CANNISTER) ×4 IMPLANT
SPONGE LAP 18X18 RF (DISPOSABLE) IMPLANT
STAPLER VISISTAT 35W (STAPLE) IMPLANT
STOCKINETTE IMPERVIOUS LG (DRAPES) ×4 IMPLANT
SUT ETHILON 2 0 PSLX (SUTURE) IMPLANT
SUT SILK 2 0 (SUTURE) ×2
SUT SILK 2-0 18XBRD TIE 12 (SUTURE) ×2 IMPLANT
SUT VIC AB 1 CTX 27 (SUTURE) ×8 IMPLANT
TOWEL GREEN STERILE (TOWEL DISPOSABLE) ×4 IMPLANT
TUBE CONNECTING 12'X1/4 (SUCTIONS) ×1
TUBE CONNECTING 12X1/4 (SUCTIONS) ×3 IMPLANT
YANKAUER SUCT BULB TIP NO VENT (SUCTIONS) ×4 IMPLANT

## 2019-09-07 NOTE — Anesthesia Preprocedure Evaluation (Addendum)
Anesthesia Evaluation  Patient identified by MRN, date of birth, ID band Patient confused    Reviewed: Allergy & Precautions, H&P , NPO status , Patient's Chart, lab work & pertinent test results  Airway Mallampati: II  TM Distance: >3 FB Neck ROM: Full    Dental no notable dental hx. (+) Teeth Intact, Dental Advisory Given   Pulmonary neg pulmonary ROS, former smoker,    Pulmonary exam normal breath sounds clear to auscultation       Cardiovascular hypertension, Pt. on medications  Rhythm:Regular Rate:Normal  Septic Hypotensive   Neuro/Psych Anxiety Encephalopathy    GI/Hepatic negative GI ROS, Neg liver ROS,   Endo/Other  negative endocrine ROS  Renal/GU Renal InsufficiencyRenal disease  negative genitourinary   Musculoskeletal  (+) Arthritis ,   Abdominal   Peds  Hematology  (+) anemia ,   Anesthesia Other Findings   Reproductive/Obstetrics negative OB ROS                            Anesthesia Physical Anesthesia Plan  ASA: IV  Anesthesia Plan: General   Post-op Pain Management:    Induction: Intravenous  PONV Risk Score and Plan: 3 and Ondansetron and Treatment may vary due to age or medical condition  Airway Management Planned: Oral ETT  Additional Equipment:   Intra-op Plan:   Post-operative Plan: Extubation in OR  Informed Consent: I have reviewed the patients History and Physical, chart, labs and discussed the procedure including the risks, benefits and alternatives for the proposed anesthesia with the patient or authorized representative who has indicated his/her understanding and acceptance.     Dental advisory given  Plan Discussed with: CRNA  Anesthesia Plan Comments:        Anesthesia Quick Evaluation

## 2019-09-07 NOTE — Transfer of Care (Signed)
Immediate Anesthesia Transfer of Care Note  Patient: Charles Blankenship  Procedure(s) Performed: CANCEL PROCEDURE (Right Knee)  Patient Location: ICU  Anesthesia Type:General  Level of Consciousness: Patient remains intubated per anesthesia plan  Airway & Oxygen Therapy: Patient remains intubated per anesthesia plan and Patient placed on Ventilator (see vital sign flow sheet for setting)  Post-op Assessment: Report given to RN and Post -op Vital signs reviewed and stable  Post vital signs: Reviewed and stable  Last Vitals:  Vitals Value Taken Time  BP 102/81 09/28/2019 1148  Temp    Pulse 126 09/23/2019 1156  Resp 22 09/09/2019 1156  SpO2 94 % 09/17/2019 1156  Vitals shown include unvalidated device data.  Last Pain:  Vitals:   09/25/2019 1012  TempSrc: Axillary  PainSc:       Patients Stated Pain Goal: 0 (10/05/19 2145)  Complications: No apparent anesthesia complications

## 2019-09-07 NOTE — Progress Notes (Signed)
Assisted tele visit to patient with family members and provider.  Tereza Gilham M Amparo Donalson, RN   

## 2019-09-07 NOTE — Progress Notes (Addendum)
Echocardiogram 2D Echocardiogram has been performed.  Charles Blankenship 09/16/2019, 12:18 PM   Dr. Jacques Navy notified of critical result.

## 2019-09-07 NOTE — Progress Notes (Signed)
eLink Physician-Brief Progress Note Patient Name: Charles Blankenship DOB: [birth year] MRN: [id number]   Date of Service  09/06/2019  HPI/Events of Note  Shock - COOX = 4729 --> 47.4 on Dobutamine IV infusion at 2.5 mcg/kg/min.  eICU Interventions  Will order: 1. Increase Dobutamine IV infusion to 5 mcg/kg/min. 2. Repeat COOX at 8 AM.     Intervention Category Major Interventions: Shock - evaluation and management  [name] 09/16/2019, 5:10 AM
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
## 2019-09-07 NOTE — Progress Notes (Signed)
Pt. Blood sugar remains hypoglycemic. Notified MD and D10 rate was changed to 125. Will continue to monitor patient.

## 2019-09-07 NOTE — Progress Notes (Addendum)
PHARMACY - PHYSICIAN COMMUNICATION CRITICAL VALUE ALERT - BLOOD CULTURE IDENTIFICATION (BCID)  Charles Blankenship is an 59 y.o. male who presented to [facility] on 09/29/2019 with a chief complaint of septic shock 2/2 osteomyelitis  Assessment:  Osteomyelitis of  L foot   Name of physician (or Provider) ContactedCeline Mans, PCCM  Current antibiotics: vancomycin + ceftriaxone  Changes to prescribed antibiotics recommended:  Recommendations accepted by provider > consolidate to cefazolin  Results for orders placed or performed during the hospital encounter of 09/06/2019  Blood Culture ID Panel (Reflexed) (Collected: 09/22/2019  9:15 PM)  Result Value Ref Range   Enterococcus species NOT DETECTED NOT DETECTED   Listeria monocytogenes NOT DETECTED NOT DETECTED   Staphylococcus species DETECTED (A) NOT DETECTED   Staphylococcus aureus (BCID) DETECTED (A) NOT DETECTED   Methicillin resistance NOT DETECTED NOT DETECTED   Streptococcus species NOT DETECTED NOT DETECTED   Streptococcus agalactiae NOT DETECTED NOT DETECTED   Streptococcus pneumoniae NOT DETECTED NOT DETECTED   Streptococcus pyogenes NOT DETECTED NOT DETECTED   Acinetobacter baumannii NOT DETECTED NOT DETECTED   Enterobacteriaceae species NOT DETECTED NOT DETECTED   Enterobacter cloacae complex NOT DETECTED NOT DETECTED   Escherichia coli NOT DETECTED NOT DETECTED   Klebsiella oxytoca NOT DETECTED NOT DETECTED   Klebsiella pneumoniae NOT DETECTED NOT DETECTED   Proteus species NOT DETECTED NOT DETECTED   Serratia marcescens NOT DETECTED NOT DETECTED   Haemophilus influenzae NOT DETECTED NOT DETECTED   Neisseria meningitidis NOT DETECTED NOT DETECTED   Pseudomonas aeruginosa NOT DETECTED NOT DETECTED   Candida albicans NOT DETECTED NOT DETECTED   Candida glabrata NOT DETECTED NOT DETECTED   Candida krusei NOT DETECTED NOT DETECTED   Candida parapsilosis NOT DETECTED NOT DETECTED   Candida tropicalis NOT DETECTED NOT DETECTED     Fredonia Highland, PharmD, BCPS Clinical Pharmacist [phone] Please check AMION for all [facility] numbers 10/01/2019

## 2019-09-07 NOTE — Anesthesia Procedure Notes (Signed)
Central Venous Catheter Insertion Performed by: Gaynelle Adu, MD, anesthesiologist Start/EndMar 01, 2021 12:20 PM, 2019-10-03 12:30 PM Patient location: OR. Preanesthetic checklist: patient identified, IV checked, site marked, risks and benefits discussed, surgical consent, monitors and equipment checked, pre-op evaluation, timeout performed and anesthesia consent Position: Trendelenburg Lidocaine 1% used for infiltration and patient sedated Hand hygiene performed , maximum sterile barriers used  and Seldinger technique used Catheter size: 8 Fr Total catheter length 16. Central line was placed.Double lumen Procedure performed using ultrasound guided technique. Ultrasound Notes:anatomy identified, needle tip was noted to be adjacent to the nerve/plexus identified, no ultrasound evidence of intravascular and/or intraneural injection and image(s) printed for medical record Attempts: 1 Following insertion, dressing applied, line sutured and Biopatch. Post procedure assessment: blood return through all ports  Patient tolerated the procedure well with no immediate complications.

## 2019-09-07 NOTE — OR Nursing (Signed)
Patient experienced cardiac arrest. ACLS interventions applied. See patient chart.

## 2019-09-07 NOTE — Progress Notes (Signed)
Initial Nutrition Assessment  DOCUMENTATION CODES:   Not applicable  INTERVENTION:    Vital 1.5 at 45 ml/h (1080 ml per day)   Pro-stat 30 ml QID   Provides 2020 kcal, 133 gm protein, 825 ml free water daily  NUTRITION DIAGNOSIS:   Increased nutrient needs related to wound healing as evidenced by estimated needs.  GOAL:   Patient will meet greater than or equal to 90% of their needs  MONITOR:   Vent status, TF tolerance, Labs, Skin  REASON FOR ASSESSMENT:   Consult Enteral/tube feeding initiation and management  ASSESSMENT:   59 yo male admitted with severe sepsis. PMH includes gout, HTN, recurrent osteomyelitis L foot, neuropathy.   Patient went to the OR today for R BKA for recurrent calcaneous osteomyelitis. He went into PEA cardiac arrest prior to surgery, requiring intubation, therefore surgery was cancelled.   Cardiology is following for acute systolic heart failure.  Received MD Consult for TF initiation and management. OG tube in place. Abdominal xray 2/3 showed progressive diffuse small bowel distention suspicious for developing partial SBO.   Patient is currently intubated on ventilator support MV: 14.5 L/min Temp (24hrs), Avg:96.7 F (35.9 C), Min:96 F (35.6 C), Max:97.6 F (36.4 C)   Labs reviewed. Na 128 (L), BUN 63 (H), creat 2.17 (H) CBG's: 32-99-61  Medications reviewed and include levophed.  Patient with 9% weight loss within the past 6-7 months, not quite significant for the time frame.   Patient had been eating poorly with nausea and vomiting for several days PTA. This, along with recent weight loss put patient at increased nutrition risk.   I/O +12.8 L  Weight up by 7.3 kg since admission  NUTRITION - FOCUSED PHYSICAL EXAM:  unable to complete  Diet Order:   Diet Order            Diet Carb Modified Fluid consistency: Thin; Room service appropriate? Yes  Diet effective now              EDUCATION NEEDS:   Not  appropriate for education at this time  Skin:  Skin Assessment: Skin Integrity Issues:(draining abscess to R foot,blistering lesions to L foot)  Last BM:  2/3 type 6  Height:   Ht Readings from Last 1 Encounters:  09/14/2019 5\' 11"  (1.803 m)    Weight:   Wt Readings from Last 1 Encounters:  09/08/2019 84.8 kg  09/20/2019 77.5 kg  Ideal Body Weight:  78.2 kg  BMI:  Body mass index is 26.07 kg/m.  Estimated Nutritional Needs:   Kcal:  2042  Protein:  130-150 gm  Fluid:  >/= 2 L    2043, RD, LDN, CNSC Pager [phone] After Hours Pager [phone]

## 2019-09-07 NOTE — Anesthesia Procedure Notes (Signed)
Procedure Name: Intubation Date/Time: 09/27/2019 10:53 AM Performed by: Janace Litten, CRNA Pre-anesthesia Checklist: Patient identified, Emergency Drugs available, Suction available and Patient being monitored Patient Re-evaluated:Patient Re-evaluated prior to induction Oxygen Delivery Method: Circle System Utilized Preoxygenation: Pre-oxygenation with 100% oxygen Induction Type: IV induction Laryngoscope Size: Mac and 4 Grade View: Grade I Tube type: Oral Tube size: 7.5 mm Number of attempts: 1 Airway Equipment and Method: Stylet Placement Confirmation: ETT inserted through vocal cords under direct vision,  positive ETCO2 and breath sounds checked- equal and bilateral Secured at: 23 cm Tube secured with: Tape Dental Injury: Teeth and Oropharynx as per pre-operative assessment

## 2019-09-07 NOTE — Progress Notes (Signed)
CRITICAL VALUE ALERT  Critical Value:  +staphylococcus aureus PCR  Date & Time Notied:  10-02-2019 3:34 AM  Provider Notified: Will notify ortho MD in AM  Orders Received/Actions taken: Staph Aureus + PCR standing orders initiated

## 2019-09-07 NOTE — Plan of Care (Signed)
  Problem: Clinical Measurements: Goal: Ability to maintain clinical measurements within normal limits will improve Outcome: Not Progressing Goal: Will remain free from infection Outcome: Not Progressing Goal: Diagnostic test results will improve Outcome: Not Progressing Goal: Cardiovascular complication will be avoided Outcome: Not Progressing   

## 2019-09-07 NOTE — Progress Notes (Signed)
Appreciate CCM management of patient.  FPTS will be happy to resume care once patient more stable and able to transfer to floor.  Dana Allan MD PGY1

## 2019-09-07 NOTE — Progress Notes (Signed)
eLink Physician-Brief Progress Note Patient Name: Charles Blankenship DOB: September 11, 1960 MRN: [id number]   Date of Service  02-Oct-2019  HPI/Events of Note  COOX  = 47.9. Hbg = 9.5 and CVP = 18.   eICU Interventions  Will order: 1. Dobutamine IV infusion at 2.5 mcg/kg/min. 2. Repeat COOX at 3 AM.     Intervention Category Major Interventions: Acid-Base disturbance - evaluation and management  Sommer,Steven Eugene 10-02-19, 1:06 AM

## 2019-09-07 NOTE — Progress Notes (Signed)
Significant Event:  [name] went to the OR today for amputation of the osteomyelitis.  Unfortunately soon after induction of anesthesia he underwent hypotension and cardiac arrest.  He required about 9 minutes of chest compressions and received 5 rounds of epinephrine as well as atropine pushes.  He returned on epinephrine vasopressin and norepinephrine drips.  Plan is to obtain bedside echocardiogram stat, and repeat labs.  I am now increasingly concerned that his primary shock is likely secondary to a cardiogenic component.  His wife does note that he has a history of longstanding hypertension and has never sought medical care for it.  I did update face today over the phone and she is coming in later today.  We did decide that chest compressions would do further harm than good and she did make him a DNR today.  The patient is critically ill with multiple organ systems failure and requires high complexity decision making for assessment and support, frequent evaluation and titration of therapies, application of advanced monitoring technologies and extensive interpretation of multiple databases.   Critical Care Time devoted to patient care services described in this note is an additional 45 minutes. This time reflects time of care of this signee [name] . This critical care time does not reflect separately billable procedures or procedure time, teaching time or supervisory time of PA/NP/Med student/Med Resident etc but could involve care discussion time.  Mickel Baas Pulmonary and Critical Care Medicine 09/30/2019 12:20 PM  Pager: [phone] After hours pager: [phone]

## 2019-09-07 NOTE — Anesthesia Postprocedure Evaluation (Signed)
Anesthesia Post Note  Patient: Charles Blankenship  Procedure(s) Performed: CANCEL PROCEDURE (Right Knee)     Patient location during evaluation: SICU Anesthesia Type: General Level of consciousness: sedated Pain management: pain level controlled Vital Signs Assessment: post-procedure vital signs reviewed and stable Respiratory status: respiratory function unstable and patient remains intubated per anesthesia plan Cardiovascular status: unstable Postop Assessment: no apparent nausea or vomiting Anesthetic complications: no Comments: Pt arrested after induction. CPR administered with ROSC. Case cancelled. Pt to ICU.    Last Vitals:  Vitals:   09/25/2019 1156 09/29/2019 1200  BP: 132/79 108/88  Pulse: (!) 127 (!) 127  Resp: (!) 22 20  Temp:  (!) 36.4 C  SpO2: 100% 100%    Last Pain:  Vitals:   09/23/2019 1200  TempSrc: Oral  PainSc:                  Derek Huneycutt,W. EDMOND

## 2019-09-07 NOTE — Interval H&P Note (Signed)
History and Physical Interval Note:  09/16/2019 6:49 AM  Charles Blankenship  has presented today for surgery, with the diagnosis of Osteomyelitis Right Calcaneus.  The various methods of treatment have been discussed with the patient and family. After consideration of risks, benefits and other options for treatment, the patient has consented to  Procedure(s): RIGHT BELOW KNEE AMPUTATION (Right) as a surgical intervention.  The patient's history has been reviewed, patient examined, no change in status, stable for surgery.  I have reviewed the patient's chart and labs.  Questions were answered to the patient's satisfaction.     [name]

## 2019-09-07 NOTE — Progress Notes (Addendum)
NAME:  Charles Blankenship, MRN:  [id number], DOB:  07-14-1961, LOS: 2 ADMISSION DATE:  Sep 20, 2019, CONSULTATION DATE:  September 20, 2019 REFERRING MD: ED, Dr. Donnald Garre CHIEF COMPLAINT:  Septic shock  Brief History   This is a 59 year old gentleman with a history of gout, hypertension, recurrent osteomyelitis and neuropathy who presented on 09-20-2019 evening with several days of decreased oral intake, nausea vomiting and worsening lower extremity pain and swelling.  Consults:  Orthopedic surgery  Procedures:  2/2 PICC placed 2/2 Arterial line placed  Significant Diagnostic Tests:  Xray with right heel osteomyelitis.   Micro Data:  Blood cultures 2019/09/20 are no growth in 1 set, and gram-positive cocci in chains and the second set.  Antimicrobials:  Vancomycin 07/04/2020 >> Cefepime 07/04/2020 >> Flagyl 07/04/2020 >> Interim history/subjective:  Overnight more confused, hypotension. A-line placed. Started on dobutamine and norepinephrine. Still with elevated lactate. Co-ox his lwo.   Objective   Blood pressure (!) 85/70, pulse (!) 114, temperature (!) 96 F (35.6 C), temperature source Axillary, resp. rate (!) 21, height 5\' 11"  (1.803 m), weight 84.8 kg, SpO2 100 %. CVP:  [18 mmHg-22 mmHg] 22 mmHg      Intake/Output Summary (Last 24 hours) at 09/24/2019 0838 Last data filed at 09/15/2019 0800 Gross per 24 hour  Intake 5031.6 ml  Output 578 ml  Net 4453.6 ml   Filed Weights   Sep 20, 2019 2044 09/06/19 0427 09/30/2019 0154  Weight: 77.5 kg 77.5 kg 84.8 kg    Examination: General: resting comfortably, no distress, delirious.  HENT: right eye enucleation Lungs: clear to asucltation bilaterally no distress on room air Cardiovascular: tachycardic, regular Abdomen: soft, nontender Extremities: bilateral pitting LE edema with erythema just below the knees. Right heel ulceration Neuro: AOx3, folows commands normal speech MSK: left upper extremity MCP joint Lines: PIV. PICC  Assessment & Plan:    59 year old gentleman with history of hypertension and chronic osteomyelitis of the left foot who presents with:  Acute metabolic encephalopathy Severe sepsis, likely secondary to skin and soft tissue infection Acute gout attack, suspected in the left upper extremity Lactic acidosis Refractory hypoglycemia Right Calcaneal Osteomyelitis Gram-positive cocci in chains bacteremia, from MSSA and strep G bacteremia Cardiogenic with septic shock  Patient is currently on a D10 drip for his refractory hypoglycemia.  Will advance diet as tolerated. He needed an increase in his D10 overnight so will need to monitor oral intake before titrating down.   Echo ordered today to evaluate shock - no cirrhosis on RUQUS, consider cardiogenic etiology.   We will continue vancomycin, cefepime, Flagyl empirically for MRSA, gram-negative, and anaerobic organisms which could precipitate his severe sepsis response in the setting of osteomyelitis.  Discussed with Dr. 41. Plan is for OR today.     On prednisone for gout 40 mg daily resume colchicine.   Addendum: Patient now with mixed cardiogenic and septic shock after having cardiac arrest.  Please see separate documentation of updated plan from note around 12 PM.  We will narrow his antibiotics to cefazolin to cover his MSSA bacteremia and a strategy.  He is also on a bicarb drip now for worsening lactic acidosis despite vasopressor and inotropic support per cardiology.    Best practice:  Diet: Tube feeds  pain/Anxiety/Delirium protocol (if indicated): prn pain control DVT prophylaxis: [facility] GI prophylaxis: n/a Glucose control: on D10 drip Foley yes for urinary retention. Mobility: with assist Code Status: Full Disposition: needs ICU due to hypoglycemia, shock  Labs   CBC: Recent Labs  Lab 2019/09/21 1303 09/06/19 0330 09/06/19 2211 09/06/2019 0323  WBC 10.9* 5.6  --  9.2  NEUTROABS 9.7*  --   --   --   HGB 11.2* 9.2* 9.5* 9.0*  HCT 33.6* 27.2*  28.0* 26.1*  MCV 75.0* 73.9*  --  72.3*  PLT 133* 80*  --  92*    Basic Metabolic Panel: Recent Labs  Lab 2019/09/21 1303 09/06/19 0330 09/06/19 2211 09/06/2019 0323  NA 132* 130* 127* 126*  K 4.4 4.6 4.3 4.6  CL 98 101  --  98  CO2 16* 13*  --  12*  GLUCOSE 38* 139*  --  121*  BUN 46* 50*  --  61*  CREATININE 1.60* 1.58*  --  1.90*  CALCIUM 8.4* 7.5*  --  7.5*  MG 1.9 1.7  --   --   PHOS 4.2  --   --   --    GFR: Estimated Creatinine Clearance: 45.1 mL/min (A) (by C-G formula based on SCr of 1.9 mg/dL (H)). Recent Labs  Lab 2019/09/21 1303 2019/09/21 1833 09/21/19 2121 09/06/19 0040 09/06/19 0330 09/06/19 1602 09/22/2019 0323  WBC 10.9*  --   --   --  5.6  --  9.2  LATICACIDVEN 4.1*   < > 5.2* 7.3* 7.3* 6.0*  --    < > = values in this interval not displayed.    Liver Function Tests: Recent Labs  Lab 09-21-2019 1303 09/06/19 0330  AST 137* 97*  ALT 92* 68*  ALKPHOS 144* 98  BILITOT 6.0* 5.8*  PROT 7.1 5.5*  ALBUMIN 1.5* 1.0*   No results for input(s): LIPASE, AMYLASE in the last 168 hours. Recent Labs  Lab 2019-09-21 1341  AMMONIA 19    ABG    Component Value Date/Time   PHART 7.374 09/06/2019 2211   PCO2ART 20.3 (L) 09/06/2019 2211   PO2ART 122.0 (H) 09/06/2019 2211   HCO3 12.0 (L) 09/06/2019 2211   TCO2 13 (L) 09/06/2019 2211   ACIDBASEDEF 12.0 (H) 09/06/2019 2211   O2SAT 47.4 09/12/2019 0424     Coagulation Profile: Recent Labs  Lab Sep 21, 2019 1303 09/06/19 0330  INR 1.9* 2.2*    Cardiac Enzymes: No results for input(s): CKTOTAL, CKMB, CKMBINDEX, TROPONINI in the last 168 hours.  HbA1C: No results found for: HGBA1C  CBG: Recent Labs  Lab 09/22/2019 0053 09/18/2019 0157 09/11/2019 0232 09/20/2019 0326 09/23/2019 0422  GLUCAP 105* 114* 116* 128* 114*    Critical care time:   The patient is critically ill with multiple organ systems failure and requires high complexity decision making for assessment and support, frequent evaluation and titration  of therapies, application of advanced monitoring technologies and extensive interpretation of multiple databases.   Critical Care Time devoted to patient care services described in this note is 57 minutes. This time reflects the time of my personal involvement. This critical care time does not reflect separately billable procedures or procedure time, teaching time or supervisory time of PA/NP/Med student/Med Resident etc but could involve care discussion time.  [facility] 09/15/2019 8:38 AM  Pager: [phone] After hours pager: [phone]
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
## 2019-09-07 NOTE — Consult Note (Addendum)
  Patient went into cardiac arrest prior to surgery.  He underwent 9 minutes of chest compression and vasodepressive medication.  Patient did restore his own heart rate and blood pressure.  Surgery was canceled patient will be taken back to the ICU additional central lines were placed.  Patient will need to be stabilized prior to proceeding with surgery.

## 2019-09-08 ENCOUNTER — Inpatient Hospital Stay (HOSPITAL_COMMUNITY): Payer: Medicaid Other

## 2019-09-08 DIAGNOSIS — R7881 Bacteremia: Secondary | ICD-10-CM

## 2019-09-08 LAB — POCT I-STAT 7, (LYTES, BLD GAS, ICA,H+H)
Acid-base deficit: 5 mmol/L — ABNORMAL HIGH (ref 0.0–2.0)
Bicarbonate: 18.8 mmol/L — ABNORMAL LOW (ref 20.0–28.0)
Calcium, Ion: 0.88 mmol/L — CL (ref 1.15–1.40)
HCT: 26 % — ABNORMAL LOW (ref 39.0–52.0)
Hemoglobin: 8.8 g/dL — ABNORMAL LOW (ref 13.0–17.0)
O2 Saturation: 99 %
Patient temperature: 97.5
Potassium: 4.2 mmol/L (ref 3.5–5.1)
Sodium: 126 mmol/L — ABNORMAL LOW (ref 135–145)
TCO2: 20 mmol/L — ABNORMAL LOW (ref 22–32)
pCO2 arterial: 29.4 mmHg — ABNORMAL LOW (ref 32.0–48.0)
pH, Arterial: 7.411 (ref 7.350–7.450)
pO2, Arterial: 152 mmHg — ABNORMAL HIGH (ref 83.0–108.0)

## 2019-09-08 LAB — BPAM PLATELET PHERESIS
Blood Product Expiration Date: 202102062359
ISSUE DATE / TIME: 202102030941
Unit Type and Rh: 6200

## 2019-09-08 LAB — GLUCOSE, CAPILLARY
Glucose-Capillary: 117 mg/dL — ABNORMAL HIGH (ref 70–99)
Glucose-Capillary: 118 mg/dL — ABNORMAL HIGH (ref 70–99)
Glucose-Capillary: 130 mg/dL — ABNORMAL HIGH (ref 70–99)
Glucose-Capillary: 132 mg/dL — ABNORMAL HIGH (ref 70–99)
Glucose-Capillary: 134 mg/dL — ABNORMAL HIGH (ref 70–99)
Glucose-Capillary: 135 mg/dL — ABNORMAL HIGH (ref 70–99)
Glucose-Capillary: 139 mg/dL — ABNORMAL HIGH (ref 70–99)
Glucose-Capillary: 147 mg/dL — ABNORMAL HIGH (ref 70–99)
Glucose-Capillary: 150 mg/dL — ABNORMAL HIGH (ref 70–99)
Glucose-Capillary: 159 mg/dL — ABNORMAL HIGH (ref 70–99)
Glucose-Capillary: 61 mg/dL — ABNORMAL LOW (ref 70–99)

## 2019-09-08 LAB — COOXEMETRY PANEL
Carboxyhemoglobin: 1.3 % (ref 0.5–1.5)
Methemoglobin: 1.1 % (ref 0.0–1.5)
O2 Saturation: 58.3 %
Total hemoglobin: 8.4 g/dL — ABNORMAL LOW (ref 12.0–16.0)

## 2019-09-08 LAB — PREPARE PLATELET PHERESIS: Unit division: 0

## 2019-09-08 LAB — PROTIME-INR
INR: 2.5 — ABNORMAL HIGH (ref 0.8–1.2)
Prothrombin Time: 26.5 seconds — ABNORMAL HIGH (ref 11.4–15.2)

## 2019-09-08 LAB — BASIC METABOLIC PANEL
Anion gap: 18 — ABNORMAL HIGH (ref 5–15)
BUN: 71 mg/dL — ABNORMAL HIGH (ref 6–20)
CO2: 15 mmol/L — ABNORMAL LOW (ref 22–32)
Calcium: 7.1 mg/dL — ABNORMAL LOW (ref 8.9–10.3)
Chloride: 93 mmol/L — ABNORMAL LOW (ref 98–111)
Creatinine, Ser: 2.28 mg/dL — ABNORMAL HIGH (ref 0.61–1.24)
GFR calc Af Amer: 35 mL/min — ABNORMAL LOW (ref >60)
GFR calc non Af Amer: 30 mL/min — ABNORMAL LOW (ref >60)
Glucose, Bld: 152 mg/dL — ABNORMAL HIGH (ref 70–99)
Potassium: 4.5 mmol/L (ref 3.5–5.1)
Sodium: 126 mmol/L — ABNORMAL LOW (ref 135–145)

## 2019-09-08 LAB — CULTURE, BLOOD (ROUTINE X 2)

## 2019-09-08 MED ORDER — HEPARIN SODIUM (PORCINE) 1000 UNIT/ML DIALYSIS
1000.0000 [IU] | INTRAMUSCULAR | Status: DC | PRN
  Administered 2019-09-09: 20:00:00 4000 [IU] via INTRAVENOUS_CENTRAL
  Filled 2019-09-08: qty 6
  Filled 2019-09-08: qty 4
  Filled 2019-09-08: qty 6

## 2019-09-08 MED ORDER — MIDAZOLAM HCL 2 MG/2ML IJ SOLN: 2.0000 mg | Freq: Once | INTRAMUSCULAR | Status: AC

## 2019-09-08 MED ORDER — FUROSEMIDE 10 MG/ML IJ SOLN
100.0000 mg | Freq: Once | INTRAVENOUS | Status: AC
  Administered 2019-09-08: 11:00:00 100 mg via INTRAVENOUS
  Filled 2019-09-08: qty 10

## 2019-09-08 MED ORDER — PRISMASOL BGK 4/2.5 32-4-2.5 MEQ/L REPLACEMENT SOLN
Status: DC
  Filled 2019-09-08 (×5): qty 5000

## 2019-09-08 MED ORDER — VASOPRESSIN 20 UNIT/ML IV SOLN
0.0300 [IU]/min | INTRAVENOUS | Status: DC
  Administered 2019-09-09: 16:00:00 0.03 [IU]/min via INTRAVENOUS
  Filled 2019-09-08: qty 2

## 2019-09-08 MED ORDER — FENTANYL CITRATE (PF) 100 MCG/2ML IJ SOLN
50.0000 ug | INTRAMUSCULAR | Status: AC | PRN
  Administered 2019-09-08 – 2019-09-09 (×3): 50 ug via INTRAVENOUS
  Filled 2019-09-08: qty 2

## 2019-09-08 MED ORDER — FENTANYL CITRATE (PF) 100 MCG/2ML IJ SOLN
50.0000 ug | INTRAMUSCULAR | Status: DC | PRN
  Administered 2019-09-08 – 2019-09-09 (×13): 100 ug via INTRAVENOUS
  Administered 2019-09-10 (×2): 200 ug via INTRAVENOUS
  Filled 2019-09-08: qty 2
  Filled 2019-09-08: qty 4
  Filled 2019-09-08 (×2): qty 2
  Filled 2019-09-08: qty 4
  Filled 2019-09-08: qty 2
  Filled 2019-09-08 (×2): qty 4
  Filled 2019-09-08 (×6): qty 2

## 2019-09-08 MED ORDER — FENTANYL CITRATE (PF) 100 MCG/2ML IJ SOLN: 50.0000 ug | Freq: Once | INTRAMUSCULAR | Status: AC

## 2019-09-08 MED ORDER — PRISMASOL BGK 4/2.5 32-4-2.5 MEQ/L REPLACEMENT SOLN
Status: DC
  Filled 2019-09-08 (×2): qty 5000

## 2019-09-08 MED ORDER — FENTANYL 2500MCG IN NS 250ML (10MCG/ML) PREMIX INFUSION: 50.0000 ug/h | INTRAVENOUS | Status: DC

## 2019-09-08 MED ORDER — WHITE PETROLATUM EX OINT
TOPICAL_OINTMENT | CUTANEOUS | Status: AC
  Filled 2019-09-08: qty 28.35

## 2019-09-08 MED ORDER — DEXMEDETOMIDINE HCL IN NACL 400 MCG/100ML IV SOLN
0.0000 ug/kg/h | INTRAVENOUS | Status: DC
  Administered 2019-09-08 (×2): 1.2 ug/kg/h via INTRAVENOUS
  Administered 2019-09-08: 0.1 ug/kg/h via INTRAVENOUS
  Administered 2019-09-09 (×4): 1.2 ug/kg/h via INTRAVENOUS
  Filled 2019-09-08 (×11): qty 100

## 2019-09-08 MED ORDER — PRISMASOL BGK 4/2.5 32-4-2.5 MEQ/L IV SOLN
INTRAVENOUS | Status: DC
  Filled 2019-09-08 (×15): qty 5000

## 2019-09-08 MED ORDER — MIDAZOLAM HCL 2 MG/2ML IJ SOLN
INTRAMUSCULAR | Status: AC
  Administered 2019-09-08: 2 mg via INTRAVENOUS
  Filled 2019-09-08: qty 2

## 2019-09-08 MED ORDER — FENTANYL CITRATE (PF) 100 MCG/2ML IJ SOLN
INTRAMUSCULAR | Status: AC
  Filled 2019-09-08: qty 2

## 2019-09-08 MED ORDER — FENTANYL BOLUS VIA INFUSION
50.0000 ug | INTRAVENOUS | Status: DC | PRN
  Filled 2019-09-08: qty 50

## 2019-09-08 MED ORDER — FENTANYL CITRATE (PF) 100 MCG/2ML IJ SOLN
25.0000 ug | INTRAMUSCULAR | Status: DC | PRN
  Administered 2019-09-08: 06:00:00 50 ug via INTRAVENOUS
  Filled 2019-09-08: qty 2

## 2019-09-08 MED ORDER — CEFAZOLIN SODIUM-DEXTROSE 2-4 GM/100ML-% IV SOLN
2.0000 g | Freq: Two times a day (BID) | INTRAVENOUS | Status: DC
  Administered 2019-09-08 – 2019-09-09 (×3): 2 g via INTRAVENOUS
  Filled 2019-09-08 (×4): qty 100

## 2019-09-08 NOTE — Progress Notes (Signed)
PHARMACY NOTE:  ANTIMICROBIAL RENAL DOSAGE ADJUSTMENT  Current antimicrobial regimen includes a mismatch between antimicrobial dosage and estimated renal function.  As per policy approved by the Pharmacy [employer] Therapeutics and Medical Executive Committees, the antimicrobial dosage will be adjusted accordingly.  Current antimicrobial dosage:  Cefazolin 2 gm q 8 hours  Indication: MSSA/Group G Strep Bacteremia   Renal Function:  Estimated Creatinine Clearance: 37.6 mL/min (A) (by C-G formula based on SCr of 2.28 mg/dL (H)). []      On intermittent HD, scheduled: []      On CRRT    Antimicrobial dosage has been changed to:  Cefazolin 2 gm Q 12 hours  Additional comments: Poor urine output and possible initiation of CRRT - Will lower dose for now- Can increase back to Q 8 if any improvement in renal function   Thank you for allowing pharmacy to be a part of this patient's care.  , PharmD, BCPS, BCIDP Infectious Diseases Clinical Pharmacist Phone: [phone] 09/08/2019 12:53 PM

## 2019-09-08 NOTE — Consult Note (Signed)
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 Enteric tube extends to the distal stomach. Electronically Signed   By: Richardean Sale M.D.   On: 09/14/2019 13:29   ECHOCARDIOGRAM COMPLETE  Result Date: 09/19/2019   ECHOCARDIOGRAM REPORT   Patient Name:   Charles Blankenship Date of Exam: 09/08/2019 Medical Rec #:  [id number]          Height:       71.0 in Accession #:    [id number]         Weight:       186.9 lb Date of Birth:  [birth year]           BSA:          2.05 m Patient Age:    35 years           BP:           102/81 mmHg Patient Gender: M                  HR:           127 bpm. Exam Location:  Inpatient Procedure: 2D Echo, Color Doppler and Cardiac Doppler Indications:    I50.9* Heart failure (unspecified)  History:        Patient has no prior history of Echocardiogram examinations.                  Risk Factors:Hypertension and Dyslipidemia.  Sonographer:    Raquel Sarna Senior RDCS Referring Phys: [id number] [place]  1. Left ventricular ejection fraction, by visual estimation, is 10-15%. The left ventricle has severely decreased function. There is no left ventricular hypertrophy. The septum and anterior wall are akinetic, inferior wall is severely hypokinetic, best preserved function in the lateral wall.  2. Severely dilated left ventricular internal cavity size.  3. Elevated left ventricular end-diastolic pressure.  4. Global right ventricle has severely reduced systolic function.The right ventricular size is mildly enlarged. No increase in right ventricular wall thickness.  5. Moderately elevated pulmonary artery systolic pressure, 48 mmHg.  6. Left atrial size was severely dilated.  7. Right atrial size was moderately dilated.  8. The mitral valve is normal in structure. Moderate mitral valve regurgitation. No evidence of mitral stenosis.  9. The tricuspid valve is normal in structure. Tricuspid valve regurgitation is moderate. 10. The aortic valve is tricuspid.  Aortic valve regurgitation is trivial. No evidence of aortic valve sclerosis or stenosis. 11. The pulmonic valve was normal in structure. Pulmonic valve regurgitation is mild. 12. The inferior vena cava is dilated in size with <50% respiratory variability, suggesting right atrial pressure of 15 mmHg. FINDINGS  Left Ventricle: Left ventricular ejection fraction, by visual estimation, is 10-15%. The left ventricle has severely decreased function. The left ventricle demonstrates regional wall motion abnormalities. The left ventricular internal cavity size was severely dilated left ventricle. There is no left ventricular hypertrophy. Left ventricular diastolic parameters are indeterminate. Elevated left ventricular end-diastolic pressure. Right Ventricle: The right ventricular size is mildly enlarged. No increase in right ventricular wall  thickness. Global RV systolic function is has severely reduced systolic function. The tricuspid regurgitant velocity is 2.86 m/s, and with an assumed right atrial pressure of 15 mmHg, the estimated right ventricular systolic pressure is moderately elevated at 47.7 mmHg. Left Atrium: Left atrial size was severely dilated. Right Atrium: Right atrial size was moderately dilated Pericardium: There is no evidence of pericardial effusion. Mitral Valve: The mitral valve is normal in structure. Moderate mitral valve regurgitation, with posteriorly-directed jet. No evidence of mitral valve stenosis by observation. Tricuspid Valve: The tricuspid valve is normal in structure. Tricuspid valve regurgitation is moderate. Aortic Valve: The aortic valve is tricuspid. Aortic valve regurgitation is trivial. The aortic valve is structurally normal, with no evidence of sclerosis or stenosis. Pulmonic Valve: The pulmonic valve was normal in structure. Pulmonic valve regurgitation is mild. Pulmonic regurgitation is mild. Aorta: The aortic root and ascending aorta are structurally normal, with no evidence of dilitation. Venous: The inferior vena cava is dilated in size with less than 50% respiratory variability, suggesting right atrial pressure of 15 mmHg. IAS/Shunts: No atrial level shunt detected by color flow Doppler. There is no evidence of a patent foramen ovale.  LEFT VENTRICLE PLAX 2D LVIDd:         6.90 cm       Diastology LVIDs:         6.10 cm       LV e' lateral:   11.70 cm/s LV PW:         0.90 cm       LV E/e' lateral: 7.6 LV IVS:        0.70 cm       LV e' medial:    5.98 cm/s LVOT diam:     2.20 cm       LV E/e' medial:  15.0 LV SV:         60 ml LV SV Index:   29.12 LVOT Area:     3.80 cm  LV Volumes (MOD) LV area d, A2C:    55.60 cm LV area d, A4C:    54.90 cm LV area s, A2C:    49.60 cm LV area s, A4C:    46.20 cm LV major d, A2C:   10.90 cm LV major d, A4C:   10.20 cm LV major s, A2C:   9.99 cm LV major s, A4C:    9.48 cm LV vol d, MOD A2C: 239.0 ml LV vol d, MOD A4C: 246.0 ml LV vol s, MOD A2C: 214.0 ml LV vol s, MOD A4C: 191.0 ml LV SV MOD A2C:     25.0 ml LV SV MOD A4C:     246.0 ml LV SV MOD BP:      43.1 ml RIGHT VENTRICLE RV S prime:     4.90  cm/s TAPSE (M-mode): 0.8 cm LEFT ATRIUM              Index       RIGHT ATRIUM           Index LA diam:        4.60 cm  2.25 cm/m  RA Area:     24.30 cm LA Vol (A2C):   130.0 ml 63.45 ml/m RA Volume:   79.80 ml  38.95 ml/m LA Vol (A4C):   96.8 ml  47.24 ml/m LA Biplane Vol: 112.0 ml 54.66 ml/m  AORTIC VALVE LVOT Vmax:   60.70 cm/s LVOT Vmean:  38.900 cm/s LVOT VTI:    0.073 m  AORTA Ao Root diam: 3.10 cm Ao Asc diam:  3.20 cm MITRAL VALVE                        TRICUSPID VALVE MV Area (PHT): 6.37 cm             TR Peak grad:   32.7 mmHg MV PHT:        34.51 msec           TR Vmax:        286.00 cm/s MV Decel Time: 119 msec MR Peak grad: 75.7 mmHg             SHUNTS MR Mean grad: 46.0 mmHg             Systemic VTI:  0.07 m MR Vmax:      435.00 cm/s           Systemic Diam: 2.20 cm MR Vmean:     321.0 cm/s MV E velocity: 89.50 cm/s 103 cm/s MV A velocity: 34.70 cm/s 70.3 cm/s MV E/A ratio:  2.58       1.5  Cherlynn Kaiser MD Electronically signed by Cherlynn Kaiser MD Signature Date/Time: 09/16/2019/12:44:34 PM    Final    [place] EKG SITE RITE  Result Date: 09/06/2019 If Site Rite image not attached, placement could not be confirmed due to current cardiac rhythm.   Assessment/Plan  1.  AKI with oliguria: in the setting of profound shock, cardiac arrest.  Volume overloaded.  Will try Lasix challenge with 100 IV Lasix (I have ordered) but if no sig UOP, will need to start CRRT.  He is a poor candidate for long-term dialysis and this should be a time- limited trial.    2.  Cardiac arrest: s/p 9 min of CPR with ROSC.  Now following commands  3.  Persistent shock: mix of cardiogenic and septic, on pressors  4.  MSSA and Strep G bacteremia: on Cefazolin  5.  Acute on  chronic systolic CHF: EF 34-37%, AHF following.  6.  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  Persistent hypoglycemia: on D10 gtt  8.  Dispo: critically ill.  Looks like prognosis is grim.  Madelon Lips 09/08/2019, 11:09 AM

## 2019-09-08 NOTE — Progress Notes (Signed)
Appreciate CCM management of patient.  FPTS will be happy to resume care once patient more stable and able to transfer to floor.  Abass Misener MD PGY1 

## 2019-09-08 NOTE — Progress Notes (Addendum)
Subjective: No new complaints   Antibiotics:  Anti-infectives (From admission, onward)   Start     Dose/Rate Route Frequency Ordered Stop   09/08/19 2200  ceFAZolin (ANCEF) IVPB 2g/100 mL premix     2 g 200 mL/hr over 30 Minutes Intravenous Every 12 hours 09/08/19 1255     09/21/2019 2100  cefTRIAXone (ROCEPHIN) 2 g in sodium chloride 0.9 % 100 mL IVPB  Status:  Discontinued     2 g 200 mL/hr over 30 Minutes Intravenous Every 24 hours 09/26/2019 1426 09/12/2019 1828   09/12/2019 2100  ceFAZolin (ANCEF) IVPB 2g/100 mL premix  Status:  Discontinued     2 g 200 mL/hr over 30 Minutes Intravenous Every 8 hours 09/09/2019 1828 09/08/19 1255   09/06/2019 1700  vancomycin (VANCOREADY) IVPB 500 mg/100 mL     500 mg 100 mL/hr over 60 Minutes Intravenous  Once 09/06/2019 1258 09/16/2019 1819   09/23/2019 0600  ceFAZolin (ANCEF) IVPB 2g/100 mL premix  Status:  Discontinued     2 g 200 mL/hr over 30 Minutes Intravenous To Short Stay 09/16/2019 0340 09/16/2019 1148   09/06/19 1400  vancomycin (VANCOREADY) IVPB 1500 mg/300 mL  Status:  Discontinued     1,500 mg 150 mL/hr over 120 Minutes Intravenous Every 24 hours 09/17/2019 1512 09/06/19 1257   09/06/19 1400  vancomycin (VANCOREADY) IVPB 1250 mg/250 mL  Status:  Discontinued     1,250 mg 166.7 mL/hr over 90 Minutes Intravenous Every 24 hours 09/06/19 1257 09/06/2019 1258   09/09/2019 2200  ceFEPIme (MAXIPIME) 2 g in sodium chloride 0.9 % 100 mL IVPB  Status:  Discontinued     2 g 200 mL/hr over 30 Minutes Intravenous Every 12 hours 09/06/2019 1509 09/22/2019 1426   10/02/2019 1345  vancomycin (VANCOREADY) IVPB 2000 mg/400 mL     2,000 mg 200 mL/hr over 120 Minutes Intravenous  Once 09/21/2019 1339 09/27/2019 2004   09/14/2019 1330  ceFEPIme (MAXIPIME) 2 g in sodium chloride 0.9 % 100 mL IVPB     2 g 200 mL/hr over 30 Minutes Intravenous  Once 09/29/2019 1324 09/14/2019 1440   09/22/2019 1330  metroNIDAZOLE (FLAGYL) IVPB 500 mg     500 mg 100 mL/hr over 60 Minutes  Intravenous  Once 09/16/2019 1324 09/11/2019 1515   09/15/2019 1330  vancomycin (VANCOCIN) IVPB 1000 mg/200 mL premix  Status:  Discontinued     1,000 mg 200 mL/hr over 60 Minutes Intravenous  Once 09/18/2019 1324 09/28/2019 1339      Medications: Scheduled Meds:  sodium chloride   Intravenous Once   sodium chloride   Intravenous Once   chlorhexidine gluconate (MEDLINE KIT)  15 mL Mouth Rinse BID   Chlorhexidine Gluconate Cloth  6 each Topical Q0600   Chlorhexidine Gluconate Cloth  6 each Topical Daily   feeding supplement (PRO-STAT SUGAR FREE 64)  30 mL Per Tube QID   Gerhardt's butt cream   Topical TID   mouth rinse  15 mL Mouth Rinse 10 times per day   mupirocin ointment  1 application Nasal BID   sodium chloride flush  10-40 mL Intracatheter Q12H   Continuous Infusions:  sodium chloride Stopped (09/17/2019 0914)    ceFAZolin (ANCEF) IV     dexmedetomidine (PRECEDEX) IV infusion 1.2 mcg/kg/hr (09/08/19 1200)   dextrose 75 mL/hr at 09/08/19 0938   DOBUTamine 5 mcg/kg/min (09/08/19 1200)   feeding supplement (VITAL 1.5 CAL) 1,000 mL (09/26/2019 1837)   norepinephrine (LEVOPHED)  Adult infusion 30 mcg/min (09/08/19 1200)   sodium bicarbonate 150 mEq in dextrose 5% 1000 mL 100 mL/hr at 09/08/19 1200   PRN Meds:.sodium chloride, fentaNYL (SUBLIMAZE) injection, fentaNYL (SUBLIMAZE) injection, sodium chloride flush    Objective: Weight change: 0 kg  Intake/Output Summary (Last 24 hours) at 09/08/2019 1401 Last data filed at 09/08/2019 1200 Gross per 24 hour  Intake 4767.83 ml  Output 480 ml  Net 4287.83 ml   Blood pressure (!) 87/66, pulse (!) 119, temperature 97.8 F (36.6 C), temperature source Axillary, resp. rate (!) 26, height '5\' 11"'  (1.803 m), weight 84.8 kg, SpO2 100 %. Temp:  [97.5 F (36.4 C)-98.4 F (36.9 C)] 97.8 F (36.6 C) (02/04 1120) Pulse Rate:  [110-232] 119 (02/04 1200) Resp:  [24-34] 26 (02/04 1200) BP: (82-114)/(61-85) 87/66 (02/04 1200) SpO2:  [87  %-100 %] 100 % (02/04 1200) Arterial Line BP: (79-112)/(53-76) 98/54 (02/04 1200) FiO2 (%):  [40 %-50 %] 40 % (02/04 1200)  Physical Exam: General: Sedated on the ventilator HEENT: anicteric sclera, EOMI CVS tachycardic Chest: , Rhonchi Abdomen: soft non-distended,  Extremities: Right lower extremity wrapped edematous extremities  Neuro: nonfocal  CBC:    BMET Recent Labs    09/19/2019 1233 09/16/2019 1235 09/18/2019 1508 09/08/19 0427  NA 130*   < > 128* 126*  K 4.8   < > 4.7 4.5  CL 99  --   --  93*  CO2 13*  --   --  15*  GLUCOSE 34*  --   --  152*  BUN 63*  --   --  71*  CREATININE 2.17*  --   --  2.28*  CALCIUM 8.2*  --   --  7.1*   < > = values in this interval not displayed.     Liver Panel  Recent Labs    09/06/19 0330 09/17/2019 1233  PROT 5.5* 6.0*  ALBUMIN 1.0* 1.5*  AST 97* 112*  ALT 68* 73*  ALKPHOS 98 109  BILITOT 5.8* 7.5*       Sedimentation Rate Recent Labs    09/06/19 0330  ESRSEDRATE 78*   C-Reactive Protein Recent Labs    09/19/2019 1833  CRP 25.4*    Micro Results: Recent Results (from the past 720 hour(s))  Culture, blood (Routine x 2)     Status: Abnormal (Preliminary result)   Collection Time: 09/27/2019  1:04 PM   Specimen: BLOOD RIGHT ARM  Result Value Ref Range Status   Specimen Description BLOOD RIGHT ARM  Final   Special Requests   Final    BOTTLES DRAWN AEROBIC AND ANAEROBIC Blood Culture adequate volume   Culture  Setup Time   Final    IN BOTH AEROBIC AND ANAEROBIC BOTTLES GRAM POSITIVE COCCI IN CHAINS CRITICAL RESULT CALLED TO, READ BACK BY AND VERIFIED WITH: G ABBOTT PHARMD 09/06/19 0041 JDW    Culture (A)  Final    STREPTOCOCCUS GROUP G STAPHYLOCOCCUS AUREUS SUSCEPTIBILITIES TO FOLLOW Performed at [facility], [place] [address], [place], [place] [address]    Report Status PENDING  Incomplete   Organism ID, Bacteria STAPHYLOCOCCUS AUREUS  Final      Susceptibility   Staphylococcus aureus - MIC*     CIPROFLOXACIN <=0.5 SENSITIVE Sensitive     ERYTHROMYCIN <=0.25 SENSITIVE Sensitive     GENTAMICIN <=0.5 SENSITIVE Sensitive     OXACILLIN 0.5 SENSITIVE Sensitive     TETRACYCLINE <=1 SENSITIVE Sensitive     VANCOMYCIN 1 SENSITIVE Sensitive  TRIMETH/SULFA <=10 SENSITIVE Sensitive     CLINDAMYCIN <=0.25 SENSITIVE Sensitive     RIFAMPIN <=0.5 SENSITIVE Sensitive     Inducible Clindamycin NEGATIVE Sensitive     * STAPHYLOCOCCUS AUREUS  Urine culture     Status: Abnormal   Collection Time: 09/06/2019  1:53 PM   Specimen: In/Out Cath Urine  Result Value Ref Range Status   Specimen Description IN/OUT CATH URINE  Final   Special Requests   Final    NONE Performed at [facility], [place] [address] [place], [place] [address]    Culture >=100,000 COLONIES/mL STAPHYLOCOCCUS AUREUS (A)  Final   Report Status 09/08/2019 FINAL  Final   Organism ID, Bacteria STAPHYLOCOCCUS AUREUS (A)  Final      Susceptibility   Staphylococcus aureus - MIC*    CIPROFLOXACIN <=0.5 SENSITIVE Sensitive     GENTAMICIN <=0.5 SENSITIVE Sensitive     NITROFURANTOIN <=16 SENSITIVE Sensitive     OXACILLIN <=0.25 SENSITIVE Sensitive     TETRACYCLINE <=1 SENSITIVE Sensitive     VANCOMYCIN 1 SENSITIVE Sensitive     TRIMETH/SULFA <=10 SENSITIVE Sensitive     CLINDAMYCIN <=0.25 SENSITIVE Sensitive     RIFAMPIN <=0.5 SENSITIVE Sensitive     Inducible Clindamycin NEGATIVE Sensitive     * >=100,000 COLONIES/mL STAPHYLOCOCCUS AUREUS  Respiratory Panel by RT PCR (Flu A&B, Covid) - Nasopharyngeal Swab     Status: None   Collection Time: 10/02/2019  1:53 PM   Specimen: Nasopharyngeal Swab  Result Value Ref Range Status   SARS Coronavirus 2 by RT PCR NEGATIVE NEGATIVE Final    Comment: (NOTE) SARS-CoV-2 target nucleic acids are NOT DETECTED. The SARS-CoV-2 RNA is generally detectable in upper respiratoy specimens during the acute phase of infection. The lowest concentration of SARS-CoV-2 viral copies this assay can  detect is 131 copies/mL. A negative result does not preclude SARS-Cov-2 infection and should not be used as the sole basis for treatment or other patient management decisions. A negative result may occur with  improper specimen collection/handling, submission of specimen other than nasopharyngeal swab, presence of viral mutation(s) within the areas targeted by this assay, and inadequate number of viral copies (<131 copies/mL). A negative result must be combined with clinical observations, patient history, and epidemiological information. The expected result is Negative. Fact Sheet for Patients:  PinkCheek.be Fact Sheet for Healthcare Providers:  GravelBags.it This test is not yet ap proved or cleared by the [place] FDA and  has been authorized for detection and/or diagnosis of SARS-CoV-2 by FDA under an Emergency Use Authorization (EUA). This EUA will remain  in effect (meaning this test can be used) for the duration of the COVID-19 declaration under Section 564(b)(1) of the Act, 21 U.S.C. section 360bbb-3(b)(1), unless the authorization is terminated or revoked sooner.    Influenza A by PCR NEGATIVE NEGATIVE Final   Influenza B by PCR NEGATIVE NEGATIVE Final    Comment: (NOTE) The Xpert Xpress SARS-CoV-2/FLU/RSV assay is intended as an aid in  the diagnosis of influenza from Nasopharyngeal swab specimens and  should not be used as a sole basis for treatment. Nasal washings and  aspirates are unacceptable for Xpert Xpress SARS-CoV-2/FLU/RSV  testing. Fact Sheet for Patients: PinkCheek.be Fact Sheet for Healthcare Providers: GravelBags.it This test is not yet approved or cleared by the [place] FDA and  has been authorized for detection and/or diagnosis of SARS-CoV-2 by  FDA under an Emergency Use Authorization (EUA). This EUA will remain  in effect (  meaning  this test can be used) for the duration of the  Covid-19 declaration under Section 564(b)(1) of the Act, 21  U.S.C. section 360bbb-3(b)(1), unless the authorization is  terminated or revoked. Performed at [facility], [place] [address] [place], [place] [address]   MRSA PCR Screening     Status: None   Collection Time: 09/06/2019  8:49 PM   Specimen: Nasal Mucosa; Nasopharyngeal  Result Value Ref Range Status   MRSA by PCR NEGATIVE NEGATIVE Final    Comment:        The GeneXpert MRSA Assay (FDA approved for NASAL specimens only), is one component of a comprehensive MRSA colonization surveillance program. It is not intended to diagnose MRSA infection nor to guide or monitor treatment for MRSA infections. Performed at [facility], [place] [address], [place], [place] [address]   Culture, blood (Routine x 2)     Status: Abnormal   Collection Time: 09/06/2019  9:15 PM   Specimen: BLOOD  Result Value Ref Range Status   Specimen Description BLOOD LEFT ARM  Final   Special Requests   Final    BOTTLES DRAWN AEROBIC ONLY Blood Culture results may not be optimal due to an inadequate volume of blood received in culture bottles   Culture  Setup Time   Final    GRAM POSITIVE COCCI AEROBIC BOTTLE ONLY CRITICAL RESULT CALLED TO, READ BACK BY AND VERIFIED WITH: Salome Holmes [facility] 09/17/2019 1813 JDW    Culture (A)  Final    STAPHYLOCOCCUS AUREUS SUSCEPTIBILITIES PERFORMED ON PREVIOUS CULTURE WITHIN THE LAST 5 DAYS. Performed at [facility], [place] [address], [place], [place] [address]    Report Status 09/08/2019 FINAL  Final  Blood Culture ID Panel (Reflexed)     Status: Abnormal   Collection Time: 09/06/2019  9:15 PM  Result Value Ref Range Status   Enterococcus species NOT DETECTED NOT DETECTED Final   Listeria monocytogenes NOT DETECTED NOT DETECTED Final   Staphylococcus species DETECTED (A) NOT DETECTED Final    Comment: CRITICAL RESULT CALLED TO, READ BACK BY AND VERIFIED  WITH: Salome Holmes [facility] 09/29/2019 1813 JDW    Staphylococcus aureus (BCID) DETECTED (A) NOT DETECTED Final    Comment: Methicillin (oxacillin) susceptible Staphylococcus aureus (MSSA). Preferred therapy is anti staphylococcal beta lactam antibiotic (Cefazolin or Nafcillin), unless clinically contraindicated. CRITICAL RESULT CALLED TO, READ BACK BY AND VERIFIED WITH: Salome Holmes [facility] 09/23/2019 1813 JDW    Methicillin resistance NOT DETECTED NOT DETECTED Final   Streptococcus species NOT DETECTED NOT DETECTED Final   Streptococcus agalactiae NOT DETECTED NOT DETECTED Final   Streptococcus pneumoniae NOT DETECTED NOT DETECTED Final   Streptococcus pyogenes NOT DETECTED NOT DETECTED Final   Acinetobacter baumannii NOT DETECTED NOT DETECTED Final   Enterobacteriaceae species NOT DETECTED NOT DETECTED Final   Enterobacter cloacae complex NOT DETECTED NOT DETECTED Final   Escherichia coli NOT DETECTED NOT DETECTED Final   Klebsiella oxytoca NOT DETECTED NOT DETECTED Final   Klebsiella pneumoniae NOT DETECTED NOT DETECTED Final   Proteus species NOT DETECTED NOT DETECTED Final   Serratia marcescens NOT DETECTED NOT DETECTED Final   Haemophilus influenzae NOT DETECTED NOT DETECTED Final   Neisseria meningitidis NOT DETECTED NOT DETECTED Final   Pseudomonas aeruginosa NOT DETECTED NOT DETECTED Final   Candida albicans NOT DETECTED NOT DETECTED Final   Candida glabrata NOT DETECTED NOT DETECTED Final   Candida krusei NOT DETECTED NOT DETECTED Final   Candida parapsilosis NOT  DETECTED NOT DETECTED Final   Candida tropicalis NOT DETECTED NOT DETECTED Final    Comment: Performed at [facility], [place] [address] [place], [place] [address]  Surgical PCR screen     Status: Abnormal   Collection Time: 09/06/19 11:20 PM   Specimen: Nasal Mucosa; Nasal Swab  Result Value Ref Range Status   MRSA, PCR NEGATIVE NEGATIVE Final   Staphylococcus aureus POSITIVE (A) NEGATIVE Final    Comment: CRITICAL RESULT  CALLED TO, READ BACK BY AND VERIFIED WITH: RN MINDY HOPPER [id number] '@[id number]'  THANEY Performed at [place] [address] [place],  [address]     Studies/Results: US Abdomen Limited  Result Date: 09/29/2019 CLINICAL DATA:  Altered mental status. Right upper quadrant fullness. EXAM: ULTRASOUND ABDOMEN LIMITED RIGHT UPPER QUADRANT COMPARISON:  None. FINDINGS: Gallbladder: The gallbladder wall measures approximately 3 mm. There is no pericholecystic free fluid. There is cholelithiasis with a dominant 1.8 cm stone. The sonographic Murphy sign cannot be adequately assessed. Common bile duct: Diameter: 4 mm Liver: No focal lesion identified. Within normal limits in parenchymal echogenicity. Portal vein is patent on color Doppler imaging with normal direction of blood flow towards the liver. Other: None. IMPRESSION: 1. Cholelithiasis without definite sonographic evidence for acute cholecystitis. However, evaluation was limited as the sonographic Percell Miller sign could not be adequately assessed in this patient. 2. Normal appearing common bile duct where visualized. Electronically Signed   By: Constance Holster M.D.   On: 09/09/2019 00:50   DG Abd Portable 1V  Result Date: 09/20/2019 CLINICAL DATA:  Abdominal distension. Cardiopulmonary arrest in the operating room during leg amputation. EXAM: PORTABLE ABDOMEN - 1 VIEW COMPARISON:  One view abdomen 09/06/2019. FINDINGS: 1317 hours. Enteric tube has been placed, projecting to the level of the distal stomach. The stomach is decompressed. There is progressive diffuse small bowel distension. Some gas is present in the right colon, although the colon is relatively decompressed, and these findings suggest a developing partial small bowel obstruction. No supine evidence of free intraperitoneal air. Previous right total hip arthroplasty noted. IMPRESSION: Progressive diffuse small bowel distension suspicious for developing partial small bowel obstruction.  Enteric tube extends to the distal stomach. Electronically Signed   By: Richardean Sale M.D.   On: 10/02/2019 13:29   ECHOCARDIOGRAM COMPLETE  Result Date: 09/20/2019   ECHOCARDIOGRAM REPORT   Patient Name:   Charles Blankenship Date of Exam: 09/20/2019 Medical Rec #:  [id number]          Height:       71.0 in Accession #:    [id number]         Weight:       186.9 lb Date of Birth:  [birth year]           BSA:          2.05 m Patient Age:    29 years           BP:           102/81 mmHg Patient Gender: M                  HR:           127 bpm. Exam Location:  Inpatient Procedure: 2D Echo, Color Doppler and Cardiac Doppler Indications:    I50.9* Heart failure (unspecified)  History:        Patient has no prior history of Echocardiogram examinations.  Risk Factors:Hypertension and Dyslipidemia.  Sonographer:    Raquel Sarna Senior RDCS Referring Phys: [id number] [place]  1. Left ventricular ejection fraction, by visual estimation, is 10-15%. The left ventricle has severely decreased function. There is no left ventricular hypertrophy. The septum and anterior wall are akinetic, inferior wall is severely hypokinetic, best preserved function in the lateral wall.  2. Severely dilated left ventricular internal cavity size.  3. Elevated left ventricular end-diastolic pressure.  4. Global right ventricle has severely reduced systolic function.The right ventricular size is mildly enlarged. No increase in right ventricular wall thickness.  5. Moderately elevated pulmonary artery systolic pressure, 48 mmHg.  6. Left atrial size was severely dilated.  7. Right atrial size was moderately dilated.  8. The mitral valve is normal in structure. Moderate mitral valve regurgitation. No evidence of mitral stenosis.  9. The tricuspid valve is normal in structure. Tricuspid valve regurgitation is moderate. 10. The aortic valve is tricuspid. Aortic valve regurgitation is trivial. No evidence of aortic valve sclerosis or  stenosis. 11. The pulmonic valve was normal in structure. Pulmonic valve regurgitation is mild. 12. The inferior vena cava is dilated in size with <50% respiratory variability, suggesting right atrial pressure of 15 mmHg. FINDINGS  Left Ventricle: Left ventricular ejection fraction, by visual estimation, is 10-15%. The left ventricle has severely decreased function. The left ventricle demonstrates regional wall motion abnormalities. The left ventricular internal cavity size was severely dilated left ventricle. There is no left ventricular hypertrophy. Left ventricular diastolic parameters are indeterminate. Elevated left ventricular end-diastolic pressure. Right Ventricle: The right ventricular size is mildly enlarged. No increase in right ventricular wall thickness. Global RV systolic function is has severely reduced systolic function. The tricuspid regurgitant velocity is 2.86 m/s, and with an assumed right atrial pressure of 15 mmHg, the estimated right ventricular systolic pressure is moderately elevated at 47.7 mmHg. Left Atrium: Left atrial size was severely dilated. Right Atrium: Right atrial size was moderately dilated Pericardium: There is no evidence of pericardial effusion. Mitral Valve: The mitral valve is normal in structure. Moderate mitral valve regurgitation, with posteriorly-directed jet. No evidence of mitral valve stenosis by observation. Tricuspid Valve: The tricuspid valve is normal in structure. Tricuspid valve regurgitation is moderate. Aortic Valve: The aortic valve is tricuspid. Aortic valve regurgitation is trivial. The aortic valve is structurally normal, with no evidence of sclerosis or stenosis. Pulmonic Valve: The pulmonic valve was normal in structure. Pulmonic valve regurgitation is mild. Pulmonic regurgitation is mild. Aorta: The aortic root and ascending aorta are structurally normal, with no evidence of dilitation. Venous: The inferior vena cava is dilated in size with less than  50% respiratory variability, suggesting right atrial pressure of 15 mmHg. IAS/Shunts: No atrial level shunt detected by color flow Doppler. There is no evidence of a patent foramen ovale.  LEFT VENTRICLE PLAX 2D LVIDd:         6.90 cm       Diastology LVIDs:         6.10 cm       LV e' lateral:   11.70 cm/s LV PW:         0.90 cm       LV E/e' lateral: 7.6 LV IVS:        0.70 cm       LV e' medial:    5.98 cm/s LVOT diam:     2.20 cm       LV E/e' medial:  15.0 LV SV:  60 ml LV SV Index:   29.12 LVOT Area:     3.80 cm  LV Volumes (MOD) LV area d, A2C:    55.60 cm LV area d, A4C:    54.90 cm LV area s, A2C:    49.60 cm LV area s, A4C:    46.20 cm LV major d, A2C:   10.90 cm LV major d, A4C:   10.20 cm LV major s, A2C:   9.99 cm LV major s, A4C:   9.48 cm LV vol d, MOD A2C: 239.0 ml LV vol d, MOD A4C: 246.0 ml LV vol s, MOD A2C: 214.0 ml LV vol s, MOD A4C: 191.0 ml LV SV MOD A2C:     25.0 ml LV SV MOD A4C:     246.0 ml LV SV MOD BP:      43.1 ml RIGHT VENTRICLE RV S prime:     4.90 cm/s TAPSE (M-mode): 0.8 cm LEFT ATRIUM              Index       RIGHT ATRIUM           Index LA diam:        4.60 cm  2.25 cm/m  RA Area:     24.30 cm LA Vol (A2C):   130.0 ml 63.45 ml/m RA Volume:   79.80 ml  38.95 ml/m LA Vol (A4C):   96.8 ml  47.24 ml/m LA Biplane Vol: 112.0 ml 54.66 ml/m  AORTIC VALVE LVOT Vmax:   60.70 cm/s LVOT Vmean:  38.900 cm/s LVOT VTI:    0.073 m  AORTA Ao Root diam: 3.10 cm Ao Asc diam:  3.20 cm MITRAL VALVE                        TRICUSPID VALVE MV Area (PHT): 6.37 cm             TR Peak grad:   32.7 mmHg MV PHT:        34.51 msec           TR Vmax:        286.00 cm/s MV Decel Time: 119 msec MR Peak grad: 75.7 mmHg             SHUNTS MR Mean grad: 46.0 mmHg             Systemic VTI:  0.07 m MR Vmax:      435.00 cm/s           Systemic Diam: 2.20 cm MR Vmean:     321.0 cm/s MV E velocity: 89.50 cm/s 103 cm/s MV A velocity: 34.70 cm/s 70.3 cm/s MV E/A ratio:  2.58       1.5  Cherlynn Kaiser MD  Electronically signed by Cherlynn Kaiser MD Signature Date/Time: 09/15/2019/12:44:34 PM    Final    [place] EKG SITE RITE  Result Date: 09/06/2019 If Site Rite image not attached, placement could not be confirmed due to current cardiac rhythm.     Assessment/Plan:  INTERVAL HISTORY: Patient was apparently following commands this morning   Principal Problem:   Staphylococcus aureus bacteremia Active Problems:   Sepsis ([place])   Severe protein-calorie malnutrition ([place])   Stasis dermatitis with venous ulcer of lower extremity due to chronic peripheral venous hypertension ([facility])   Cardiogenic shock ([place])    Charles Blankenship is a 59 y.o. male with chronic right calcaneal osteomyelitis with prior MSSA and group G  streptococcal infection then MRSA infection who is been admitted with septic shock due to progression of his calcaneal osteomyelitis and now with MSSA and group G strep in the blood status post cardiac arrest prior to planned trip to the OR for BKA.  Echocardiogram done post cardiac arrest showed him to have an EF of 10 to 15% and a dilated cardiomyopathy.  He does have a history of heavy alcohol abuse.  He is currently on pressors and dobutamine and CRRT is being initiated.  #1 MSSA bacteremia group G streptococcal bacteremia with septic shock and endorgan failure due to chronic osteomyelitis in right foot  --Agree with consolidation to cefazolin and renal dosing of this drug in light of CRRT --He will need BKA if he survives --Will need a line holiday if he survives --- He also need to have an investigation for endocarditis with a TEE performed if he survives       LOS: 3 days   Alcide Evener 09/08/2019, 2:01 PM

## 2019-09-08 NOTE — Progress Notes (Addendum)
Advanced Heart Failure Rounding Note  PCP-Cardiologist: No primary care provider on file.   Subjective:    Remains on dobutamine 2.5 mcg + norepi 22 mcg. CO-OX 58%   Remains intubated 40%.  Opens eyes remains intubated.    Objective:   Weight Range: 84.8 kg Body mass index is 26.07 kg/m.   Vital Signs:   Temp:  [97.5 F (36.4 C)-98.4 F (36.9 C)] 97.8 F (36.6 C) (02/04 0740) Pulse Rate:  [110-232] 118 (02/04 1000) Resp:  [20-34] 26 (02/04 1000) BP: (82-132)/(61-88) 88/66 (02/04 1000) SpO2:  [87 %-100 %] 100 % (02/04 1000) Arterial Line BP: (79-132)/(53-80) 80/57 (02/04 0800) FiO2 (%):  [40 %-100 %] 40 % (02/04 0816) Last BM Date: 10/01/2019  Weight change: Filed Weights   09/06/19 0427 09/24/2019 0154 09/29/2019 1017  Weight: 77.5 kg 84.8 kg 84.8 kg    Intake/Output:   Intake/Output Summary (Last 24 hours) at 09/08/2019 1028 Last data filed at 09/08/2019 1000 Gross per 24 hour  Intake 5905.72 ml  Output 510 ml  Net 5395.72 ml      Physical Exam   CVP 20  General:  Intubated  HEENT: normal Neck: supple. JVP to jaw  Carotids 2+ bilat; no bruits. No lymphadenopathy or thryomegaly appreciated. Cor: PMI nondisplaced. Regular rate & rhythm. No rubs,  or murmurs. +S#  Lungs: clear Abdomen: soft, nontender, nondistended. No hepatosplenomegaly. No bruits or masses. Good bowel sounds. Extremities: no cyanosis, clubbing, rash, R and LLE with ischemic changes.  Neuro: intubated opens eyes.    Telemetry  Sinus Tach   EKG    N/a   Labs    CBC Recent Labs    09/08/2019 1303 09/06/19 0330 09/13/2019 0323 09/22/2019 0947 09/17/2019 1233 09/06/2019 1233 09/11/2019 1235 09/11/2019 1508  WBC 10.9*   < > 9.2  --  9.3  --   --   --   NEUTROABS 9.7*  --   --   --   --   --   --   --   HGB 11.2*   < > 9.0*   < > 8.1*   < > 8.8* 9.5*  HCT 33.6*   < > 26.1*   < > 24.5*   < > 26.0* 28.0*  MCV 75.0*   < > 72.3*  --  75.6*  --   --   --   PLT 133*   < > 92*  --  113*  --   --    --    < > = values in this interval not displayed.   Basic Metabolic Panel Recent Labs    09/09/2019 1303 09/21/2019 1303 09/06/19 0330 09/06/19 2211 09/26/2019 1233 09/27/2019 1235 09/11/2019 1508 09/08/19 0427  NA 132*   < > 130*   < > 130*   < > 128* 126*  K 4.4   < > 4.6   < > 4.8   < > 4.7 4.5  CL 98   < > 101   < > 99  --   --  93*  CO2 16*   < > 13*   < > 13*  --   --  15*  GLUCOSE 38*   < > 139*   < > 34*  --   --  152*  BUN 46*   < > 50*   < > 63*  --   --  71*  CREATININE 1.60*   < > 1.58*   < > 2.17*  --   --  2.28*  CALCIUM 8.4*   < > 7.5*   < > 8.2*  --   --  7.1*  MG 1.9  --  1.7  --   --   --   --   --   PHOS 4.2  --   --   --   --   --   --   --    < > = values in this interval not displayed.   Liver Function Tests Recent Labs    09/06/19 0330 09/06/2019 1233  AST 97* 112*  ALT 68* 73*  ALKPHOS 98 109  BILITOT 5.8* 7.5*  PROT 5.5* 6.0*  ALBUMIN 1.0* 1.5*   No results for input(s): LIPASE, AMYLASE in the last 72 hours. Cardiac Enzymes No results for input(s): CKTOTAL, CKMB, CKMBINDEX, TROPONINI in the last 72 hours.  BNP: BNP (last 3 results) No results for input(s): BNP in the last 8760 hours.  ProBNP (last 3 results) No results for input(s): PROBNP in the last 8760 hours.   D-Dimer No results for input(s): DDIMER in the last 72 hours. Hemoglobin A1C No results for input(s): HGBA1C in the last 72 hours. Fasting Lipid Panel No results for input(s): CHOL, HDL, LDLCALC, TRIG, CHOLHDL, LDLDIRECT in the last 72 hours. Thyroid Function Tests No results for input(s): TSH, T4TOTAL, T3FREE, THYROIDAB in the last 72 hours.  Invalid input(s): FREET3  Other results:   Imaging    DG Abd Portable 1V  Result Date: 10/01/2019 CLINICAL DATA:  Abdominal distension. Cardiopulmonary arrest in the operating room during leg amputation. EXAM: PORTABLE ABDOMEN - 1 VIEW COMPARISON:  One view abdomen 09/06/2019. FINDINGS: 1317 hours. Enteric tube has been placed,  projecting to the level of the distal stomach. The stomach is decompressed. There is progressive diffuse small bowel distension. Some gas is present in the right colon, although the colon is relatively decompressed, and these findings suggest a developing partial small bowel obstruction. No supine evidence of free intraperitoneal air. Previous right total hip arthroplasty noted. IMPRESSION: Progressive diffuse small bowel distension suspicious for developing partial small bowel obstruction. Enteric tube extends to the distal stomach. Electronically Signed   By: Richardean Sale M.D.   On: 09/29/2019 13:29   ECHOCARDIOGRAM COMPLETE  Result Date: 09/28/2019   ECHOCARDIOGRAM REPORT   Patient Name:   Charles Blankenship Date of Exam: 09/14/2019 Medical Rec #:  [id number]          Height:       71.0 in Accession #:    [id number]         Weight:       186.9 lb Date of Birth:  May 15, 1961           BSA:          2.05 m Patient Age:    59 years           BP:           102/81 mmHg Patient Gender: M                  HR:           127 bpm. Exam Location:  Inpatient Procedure: 2D Echo, Color Doppler and Cardiac Doppler Indications:    I50.9* Heart failure (unspecified)  History:        Patient has no prior history of Echocardiogram examinations.                 Risk Factors:Hypertension and Dyslipidemia.  Sonographer:  [place] Referring Phys: [id number] [place]  1. Left ventricular ejection fraction, by visual estimation, is 10-15%. The left ventricle has severely decreased function. There is no left ventricular hypertrophy. The septum and anterior wall are akinetic, inferior wall is severely hypokinetic, best preserved function in the lateral wall.  2. Severely dilated left ventricular internal cavity size.  3. Elevated left ventricular end-diastolic pressure.  4. Global right ventricle has severely reduced systolic function.The right ventricular size is mildly enlarged. No increase in right ventricular  wall thickness.  5. Moderately elevated pulmonary artery systolic pressure, 48 mmHg.  6. Left atrial size was severely dilated.  7. Right atrial size was moderately dilated.  8. The mitral valve is normal in structure. Moderate mitral valve regurgitation. No evidence of mitral stenosis.  9. The tricuspid valve is normal in structure. Tricuspid valve regurgitation is moderate. 10. The aortic valve is tricuspid. Aortic valve regurgitation is trivial. No evidence of aortic valve sclerosis or stenosis. 11. The pulmonic valve was normal in structure. Pulmonic valve regurgitation is mild. 12. The inferior vena cava is dilated in size with <50% respiratory variability, suggesting right atrial pressure of 15 mmHg. FINDINGS  Left Ventricle: Left ventricular ejection fraction, by visual estimation, is 10-15%. The left ventricle has severely decreased function. The left ventricle demonstrates regional wall motion abnormalities. The left ventricular internal cavity size was severely dilated left ventricle. There is no left ventricular hypertrophy. Left ventricular diastolic parameters are indeterminate. Elevated left ventricular end-diastolic pressure. Right Ventricle: The right ventricular size is mildly enlarged. No increase in right ventricular wall thickness. Global RV systolic function is has severely reduced systolic function. The tricuspid regurgitant velocity is 2.86 m/s, and with an assumed right atrial pressure of 15 mmHg, the estimated right ventricular systolic pressure is moderately elevated at 47.7 mmHg. Left Atrium: Left atrial size was severely dilated. Right Atrium: Right atrial size was moderately dilated Pericardium: There is no evidence of pericardial effusion. Mitral Valve: The mitral valve is normal in structure. Moderate mitral valve regurgitation, with posteriorly-directed jet. No evidence of mitral valve stenosis by observation. Tricuspid Valve: The tricuspid valve is normal in structure. Tricuspid  valve regurgitation is moderate. Aortic Valve: The aortic valve is tricuspid. Aortic valve regurgitation is trivial. The aortic valve is structurally normal, with no evidence of sclerosis or stenosis. Pulmonic Valve: The pulmonic valve was normal in structure. Pulmonic valve regurgitation is mild. Pulmonic regurgitation is mild. Aorta: The aortic root and ascending aorta are structurally normal, with no evidence of dilitation. Venous: The inferior vena cava is dilated in size with less than 50% respiratory variability, suggesting right atrial pressure of 15 mmHg. IAS/Shunts: No atrial level shunt detected by color flow Doppler. There is no evidence of a patent foramen ovale.  LEFT VENTRICLE PLAX 2D LVIDd:         6.90 cm       Diastology LVIDs:         6.10 cm       LV e' lateral:   11.70 cm/s LV PW:         0.90 cm       LV E/e' lateral: 7.6 LV IVS:        0.70 cm       LV e' medial:    5.98 cm/s LVOT diam:     2.20 cm       LV E/e' medial:  15.0 LV SV:         60  ml LV SV Index:   29.12 LVOT Area:     3.80 cm  LV Volumes (MOD) LV area d, A2C:    55.60 cm LV area d, A4C:    54.90 cm LV area s, A2C:    49.60 cm LV area s, A4C:    46.20 cm LV major d, A2C:   10.90 cm LV major d, A4C:   10.20 cm LV major s, A2C:   9.99 cm LV major s, A4C:   9.48 cm LV vol d, MOD A2C: 239.0 ml LV vol d, MOD A4C: 246.0 ml LV vol s, MOD A2C: 214.0 ml LV vol s, MOD A4C: 191.0 ml LV SV MOD A2C:     25.0 ml LV SV MOD A4C:     246.0 ml LV SV MOD BP:      43.1 ml RIGHT VENTRICLE RV S prime:     4.90 cm/s TAPSE (M-mode): 0.8 cm LEFT ATRIUM              Index       RIGHT ATRIUM           Index LA diam:        4.60 cm  2.25 cm/m  RA Area:     24.30 cm LA Vol (A2C):   130.0 ml 63.45 ml/m RA Volume:   79.80 ml  38.95 ml/m LA Vol (A4C):   96.8 ml  47.24 ml/m LA Biplane Vol: 112.0 ml 54.66 ml/m  AORTIC VALVE LVOT Vmax:   60.70 cm/s LVOT Vmean:  38.900 cm/s LVOT VTI:    0.073 m  AORTA Ao Root diam: 3.10 cm Ao Asc diam:  3.20 cm MITRAL  VALVE                        TRICUSPID VALVE MV Area (PHT): 6.37 cm             TR Peak grad:   32.7 mmHg MV PHT:        34.51 msec           TR Vmax:        286.00 cm/s MV Decel Time: 119 msec MR Peak grad: 75.7 mmHg             SHUNTS MR Mean grad: 46.0 mmHg             Systemic VTI:  0.07 m MR Vmax:      435.00 cm/s           Systemic Diam: 2.20 cm MR Vmean:     321.0 cm/s MV E velocity: 89.50 cm/s 103 cm/s MV A velocity: 34.70 cm/s 70.3 cm/s MV E/A ratio:  2.58       1.5  Cherlynn Kaiser MD Electronically signed by Cherlynn Kaiser MD Signature Date/Time: 09/06/2019/12:44:34 PM    Final       Medications:     Scheduled Medications: . sodium chloride   Intravenous Once  . sodium chloride   Intravenous Once  . chlorhexidine gluconate (MEDLINE KIT)  15 mL Mouth Rinse BID  . Chlorhexidine Gluconate Cloth  6 each Topical Q0600  . Chlorhexidine Gluconate Cloth  6 each Topical Daily  . feeding supplement (PRO-STAT SUGAR FREE 64)  30 mL Per Tube QID  . Gerhardt's butt cream   Topical TID  . mouth rinse  15 mL Mouth Rinse 10 times per day  . mupirocin ointment  1 application Nasal BID  . sodium  chloride flush  10-40 mL Intracatheter Q12H     Infusions: . sodium chloride Stopped (09/06/2019 0914)  .  ceFAZolin (ANCEF) IV Stopped (09/08/19 0555)  . dexmedetomidine (PRECEDEX) IV infusion 0.1 mcg/kg/hr (09/08/19 1000)  . dextrose 75 mL/hr at 09/08/19 0938  . DOBUTamine 2.5 mcg/kg/min (09/08/19 1000)  . feeding supplement (VITAL 1.5 CAL) 1,000 mL (09/23/2019 1837)  . furosemide    . norepinephrine (LEVOPHED) Adult infusion 22 mcg/min (09/08/19 1000)  . sodium bicarbonate 150 mEq in dextrose 5% 1000 mL 100 mL/hr at 09/08/19 1000     PRN Medications:  sodium chloride, fentaNYL (SUBLIMAZE) injection, fentaNYL (SUBLIMAZE) injection, sodium chloride flush   Assessment/Plan  1. Septic Shock/Cardiogenic Shock.  Hypotensive on Norepi 70mg. Increase dobutamine 5 mcg . CO-OX 58% - Lactic Acid 7    Blood Cx 1/2 gram + cocci Urine Cx- staph aureus  2. PEA 09/26/2019 - soon after anesthesia  -  CPR 9 min with epi /atroprine.  - Remains intubated/unresponsive.   3. Lactic Acidosis Lactic Acid 4.1>4.8>5.2>7.3   4.  R Calcaneal Osteo  Per Ortho - Plan for amputation but aborted due to cardiac arrest   5.  Acute Systolic Heart Failure ECHO today severely reduced LVEF < 15%. WMA HS Trop 39>44 - CO-OX 58% -CVP  20  -  Received IV lasix this morning with minimal results.  - Continue norepi 22 mcg - Increase dobutamine to 5 mcg.    6 . Hypoglycemia  CCM managing   6. AKI  -Creatinine trending up 2.2>2.3  -  Minimal urine output. Not a candidate for CVVHD.  - Nephrology consulted.   7. Gout   8. DNR/DNI    Length of Stay: 3  Amy Clegg, NP  09/08/2019, 10:28 AM  Advanced Heart Failure Team Pager [phone](M-F; 7a - 4p)  Please contact [facility] for night-coverage after hours (4p -7a ) and weekends on [email or web address]  Patient seen with NP, agree with the above note .  He remains on vent.  He will wake up with stimulation and appears to follow some commands.  He is oliguric, UOP 135 cc last 24 hrs.  CVP > 20.  Co-ox 58% on dobutamine 2.5 + norepinephrine 22 with MAP 65 range. He has MSSA and Group G strep bacteremia, on cefazolin.   General: on vent Neck: JVP 14+, no thyromegaly or thyroid nodule.  Lungs: Decreased at bases.  CV: Nondisplaced PMI.  Heart mildly tachy, regular S1/S2, no S3/S4, no murmur.  2+ edema to knees. Abdomen: Soft, nontender, no hepatosplenomegaly, no distention.  Skin: Intact without lesions or rashes.  Neurologic: Will awaken, follow some commands.  Extremities: No clubbing or cyanosis.  HEENT: Normal.   1. Shock: Mixed septic/cardiogenic.  Echo with EF 10-15%, severely decreased RV systolic function.  Had history of ETOH abuse prior to admission, also cannot rule out coronary disease (did not have ACS pattern Hs-Troponin trend).  Could  also be stress cardiomyopathy related to sepsis.  He is markedly volume overloaded with oliguria.  Co-ox 58% with MAP 65 range.  - Increase dobutamine to 5 mcg/kg/min.  - Continue norepinephrine, titrate as needed.  - If needs additional pressor, use vasopressin.  - Getting Lasix 100 mg IV x 1 this morning.  If no response to Lasix boluses, will need to move to CVVH.  - Eventual ischemic evaluation if recovers.  - Not candidate for mechanical support at this time with bacteremia.  2. ID: Component of septic shock.  Calcaneal osteomyelitis  now with MSSA and Group G strep bacteremia.   - Cefazolin.  - ID following.  - Will need eventual TEE.  Do not have do do now as will not change current management.  - Eventually needs right BKA.  3. AKI: Oliguria with creatinine up to 2.78.  If not response to IV Lasix, will likely need CVVH with CVP now > 20.  Renal following.  4. PEA arrest 2/3, 9 minutes CPR.  5. Neuro: Seems to be responding/following commands today.   CRITICAL CARE Performed by: Loralie Champagne  Total critical care time: 35 minutes  Critical care time was exclusive of separately billable procedures and treating other patients.  Critical care was necessary to treat or prevent imminent or life-threatening deterioration.  Critical care was time spent personally by me on the following activities: development of treatment plan with patient and/or surrogate as well as nursing, discussions with consultants, evaluation of patient's response to treatment, examination of patient, obtaining history from patient or surrogate, ordering and performing treatments and interventions, ordering and review of laboratory studies, ordering and review of radiographic studies, pulse oximetry and re-evaluation of patient's condition.  Loralie Champagne 09/08/2019 11:12 AM

## 2019-09-08 NOTE — Progress Notes (Signed)
NAME:  Charles Blankenship, MRN:  [id number], DOB:  06/03/1961, LOS: 3 ADMISSION DATE:  09/14/2019, CONSULTATION DATE:  09/08/2019 REFERRING MD: ED, Dr. Donnald Garre CHIEF COMPLAINT:  Septic shock  Brief History   This is a 59 year old gentleman with a history of gout, hypertension, recurrent osteomyelitis and neuropathy who presented on 09/13/2019 evening with several days of decreased oral intake, nausea vomiting and worsening lower extremity pain and swelling.  Consults:  Orthopedic surgery  Procedures:  2/2 PICC placed 2/2 Arterial line placed  Significant Diagnostic Tests:  Xray with right heel osteomyelitis.   Micro Data:  Blood cultures 09/06/2019  gram-positive cocci in chains - MSSA and Strep G  Antimicrobials:  Vancomycin 102/28/2021 >> Cefepime 102/10/2019 >> Flagyl 102/15/2021 >> Interim history/subjective:  This morning alert and agitated. Following commands. Uncomfortable.  Objective   Blood pressure (!) 82/68, pulse (!) 119, temperature 97.8 F (36.6 C), temperature source Axillary, resp. rate (!) 26, height 5\' 11"  (1.803 m), weight 84.8 kg, SpO2 100 %. CVP:  [18 mmHg-20 mmHg] 19 mmHg  Vent Mode: PRVC FiO2 (%):  [40 %-100 %] 40 % Set Rate:  [22 bmp-26 bmp] 26 bmp Vt Set:  [600 mL] 600 mL PEEP:  [5 cmH20-8 cmH20] 5 cmH20 Plateau Pressure:  [20 cmH20-22 cmH20] 20 cmH20   Intake/Output Summary (Last 24 hours) at 09/08/2019 0906 Last data filed at 09/08/2019 0600 Gross per 24 hour  Intake 5931.03 ml  Output 510 ml  Net 5421.03 ml   Filed Weights   09/06/19 0427 2019/09/23 0154 09/23/2019 1017  Weight: 77.5 kg 84.8 kg 84.8 kg    Examination: General: alert, intubated uncomfortable, following commands. HENT: right eye enucleation Lungs: intubated, sedated Cardiovascular: tachycardic, regular Abdomen: soft, nontender Extremities: bilateral pitting LE edema with erythema just below the knees. Right heel ulceration Neuro: AOx3, folows commands normal speech MSK: left upper  extremity MCP joint Lines: right radial arterial line. Right IJ CVC.   Assessment & Plan:  59 year old gentleman with history of hypertension and chronic osteomyelitis of the left foot who presents with:  Acute metabolic encephalopathy, improved Cardiac Arrest - CPR for 10 minutes, achieved ROSC.  Cardiogenic with septic shock, mixed Refractory hypoglycemia Right Calcaneal Osteomyelitis Septic Shock from MSSA and strep G bacteremia Lactic Acidosis Oliguric AKI  Will sedate with precedex infusion. He is still in shock. Continue Dobutamine 2.5 mcg and norepinephrine. ScvO2 is low so I believe cardiogenic component is driving the majority of his shock. He is not a candidate for advanced mechanical support at this time.   Still with refractory hypoglycemia Bicarb running at 100, D10 at 125. Will decrease D10- rate and closely monitor CBG  Continue Cefazolin for his MSSA and Strep G bacteremia. May need TEE at some point. TTE without veg.  Will consult nephrology for oliguric AKI.    Best practice:  Diet: Tube feeds at goal still requiring D10 pain/Anxiety/Delirium protocol (if indicated): prn pain control DVT prophylaxis: [facility] GI prophylaxis: n/a Glucose control: on D10 drip Foley yes for urinary retention. Mobility: with assist Code Status: Full Disposition: needs ICU due to hypoglycemia, shock  Labs   CBC: Recent Labs  Lab 09/09/2019 1303 09/09/2019 1303 09/06/19 0330 09/06/19 2211 09-23-2019 0323 Sep 23, 2019 0323 September 23, 2019 0947 09-23-2019 1116 23-Sep-2019 1233 09-23-2019 1235 2019/09/23 1508  WBC 10.9*  --  5.6  --  9.2  --   --   --  9.3  --   --   NEUTROABS 9.7*  --   --   --   --   --   --   --   --   --   --  HGB 11.2*   < > 9.2*   < > 9.0*   < > 9.9* 8.8* 8.1* 8.8* 9.5*  HCT 33.6*   < > 27.2*   < > 26.1*   < > 29.0* 26.0* 24.5* 26.0* 28.0*  MCV 75.0*  --  73.9*  --  72.3*  --   --   --  75.6*  --   --   PLT 133*  --  80*  --  92*  --   --   --  113*  --   --    < > = values  in this interval not displayed.    Basic Metabolic Panel: Recent Labs  Lab 09/27/2019 1303 09/20/2019 1303 09/06/19 0330 09/06/19 2211 09/29/2019 0323 09/27/2019 0947 09/05/2019 1116 09/23/2019 1233 09/06/2019 1235 09/13/2019 1508 09/08/19 0427  NA 132*   < > 130*   < > 126*   < > 130* 130* 128* 128* 126*  K 4.4   < > 4.6   < > 4.6   < > 5.1 4.8 4.6 4.7 4.5  CL 98  --  101  --  98  --   --  99  --   --  93*  CO2 16*  --  13*  --  12*  --   --  13*  --   --  15*  GLUCOSE 38*  --  139*  --  121*  --   --  34*  --   --  152*  BUN 46*  --  50*  --  61*  --   --  63*  --   --  71*  CREATININE 1.60*  --  1.58*  --  1.90*  --   --  2.17*  --   --  2.28*  CALCIUM 8.4*  --  7.5*  --  7.5*  --   --  8.2*  --   --  7.1*  MG 1.9  --  1.7  --   --   --   --   --   --   --   --   PHOS 4.2  --   --   --   --   --   --   --   --   --   --    < > = values in this interval not displayed.   GFR: Estimated Creatinine Clearance: 37.6 mL/min (A) (by C-G formula based on SCr of 2.28 mg/dL (H)). Recent Labs  Lab 09/16/2019 1303 09/27/2019 1833 09/06/19 0040 09/06/19 0330 09/06/19 1602 10/01/2019 0323 09/17/2019 1230 09/16/2019 1233  WBC 10.9*  --   --  5.6  --  9.2  --  9.3  LATICACIDVEN 4.1*   < > 7.3* 7.3* 6.0*  --  6.4*  --    < > = values in this interval not displayed.    Liver Function Tests: Recent Labs  Lab 09/26/2019 1303 09/06/19 0330 09/21/2019 1233  AST 137* 97* 112*  ALT 92* 68* 73*  ALKPHOS 144* 98 109  BILITOT 6.0* 5.8* 7.5*  PROT 7.1 5.5* 6.0*  ALBUMIN 1.5* 1.0* 1.5*   No results for input(s): LIPASE, AMYLASE in the last 168 hours. Recent Labs  Lab 09/06/2019 1341  AMMONIA 19    ABG    Component Value Date/Time   PHART 7.198 (LL) 09/24/2019 1508   PCO2ART 30.0 (L) 09/22/2019 1508   PO2ART 257.0 (H) 09/22/2019 1508   HCO3 11.8 (L) 09/17/2019  1508   TCO2 13 (L) 09/24/2019 1508   ACIDBASEDEF 15.0 (H) 10/01/2019 1508   O2SAT 58.3 09/08/2019 0427     Coagulation Profile: Recent Labs   Lab 09/16/19 1303 09/06/19 0330 09/12/2019 1402 09/08/19 0427  INR 1.9* 2.2* 2.2* 2.5*    Cardiac Enzymes: No results for input(s): CKTOTAL, CKMB, CKMBINDEX, TROPONINI in the last 168 hours.  HbA1C: No results found for: HGBA1C  CBG: Recent Labs  Lab 09/20/2019 2105 09/13/2019 2349 09/08/19 0326 09/08/19 0433 09/08/19 0738  GLUCAP 159* 147* 130* 150* 132*    Critical care time:   The patient is critically ill with multiple organ systems failure and requires high complexity decision making for assessment and support, frequent evaluation and titration of therapies, application of advanced monitoring technologies and extensive interpretation of multiple databases.   Critical Care Time devoted to patient care services described in this note is 37 minutes. This time reflects the time of my personal involvement. This critical care time does not reflect separately billable procedures or procedure time, teaching time or supervisory time of PA/NP/Med student/Med Resident etc but could involve care discussion time.  [facility] 09/08/2019 9:06 AM  Pager: [phone] After hours pager: [phone]

## 2019-09-08 NOTE — Procedures (Signed)
Central Venous Catheter Insertion Procedure Note Charles Blankenship [id number] 06/29/1961  Procedure: Insertion of Central Venous Catheter - Trialysis catheter for CRRT.  Indications: renal failure  Procedure Details Consent: Risks of procedure as well as the alternatives and risks of each were explained to the (patient/caregiver).  Consent for procedure obtained. Time Out: Verified patient identification, verified procedure, site/side was marked, verified correct patient position, special equipment/implants available, medications/allergies/relevent history reviewed, required imaging and test results available.  Performed  Maximum sterile technique was used including antiseptics, cap, gloves, gown, hand hygiene, mask and sheet. Skin prep: Chlorhexidine; local anesthetic administered A antimicrobial bonded/coated triple lumen catheter was placed in the left internal jugular vein using the Seldinger technique. Ultrasound guidance was used and wire was confirmed in the left internal jugular vein in the long and short axis.   Evaluation Blood flow good Complications: No apparent complications Patient did tolerate procedure well. Chest X-ray ordered to verify placement.  CXR: pending.  Charles Blankenship 09/08/2019, 6:31 PM

## 2019-09-08 NOTE — Progress Notes (Signed)
eLink Physician-Brief Progress Note Patient Name: Charles Blankenship DOB: 04-09-1961 MRN: [id number]   Date of Service  09/08/2019  HPI/Events of Note  Hypotension - SBP = 86.   eICU Interventions  Will order:  1. Increase ceiling on Norepinephrine IV infusion to 60 mcg/min. 2. Vasopressin IV infusion at shock dose.  3. ABG STAT.     Intervention Category Major Interventions: Hypotension - evaluation and management  Von Quintanar Dennard Nip 09/08/2019, 8:54 PM

## 2019-09-08 NOTE — Progress Notes (Signed)
eLink Physician-Brief Progress Note Patient Name: Charles Blankenship DOB: 1960/09/01 MRN: [id number]   Date of Service  09/08/2019  HPI/Events of Note  Agitation - Patient now waking up.  eICU Interventions  Will order: 1. Fentanyl 25-50 mcg IV Q 2 hours PRN pain, agitation or sedation.      Intervention Category Major Interventions: Delirium, psychosis, severe agitation - evaluation and management  [name] 09/08/2019, 6:25 AM

## 2019-09-08 NOTE — Progress Notes (Signed)
Assisted tele visit to patient with family member.  Keitha Kolk R, RN  

## 2019-09-09 DIAGNOSIS — E440 Moderate protein-calorie malnutrition: Secondary | ICD-10-CM

## 2019-09-09 DIAGNOSIS — N179 Acute kidney failure, unspecified: Secondary | ICD-10-CM

## 2019-09-09 DIAGNOSIS — M86072 Acute hematogenous osteomyelitis, left ankle and foot: Secondary | ICD-10-CM

## 2019-09-09 LAB — RENAL FUNCTION PANEL
Albumin: 1.2 g/dL — ABNORMAL LOW (ref 3.5–5.0)
Albumin: 1.2 g/dL — ABNORMAL LOW (ref 3.5–5.0)
Anion gap: 13 (ref 5–15)
Anion gap: 15 (ref 5–15)
BUN: 59 mg/dL — ABNORMAL HIGH (ref 6–20)
BUN: 44 mg/dL — ABNORMAL HIGH (ref 6–20)
CO2: 19 mmol/L — ABNORMAL LOW (ref 22–32)
CO2: 18 mmol/L — ABNORMAL LOW (ref 22–32)
Calcium: 6.7 mg/dL — ABNORMAL LOW (ref 8.9–10.3)
Calcium: 6.8 mg/dL — ABNORMAL LOW (ref 8.9–10.3)
Chloride: 95 mmol/L — ABNORMAL LOW (ref 98–111)
Chloride: 98 mmol/L (ref 98–111)
Creatinine, Ser: 1.86 mg/dL — ABNORMAL HIGH (ref 0.61–1.24)
Creatinine, Ser: 1.58 mg/dL — ABNORMAL HIGH (ref 0.61–1.24)
GFR calc Af Amer: 45 mL/min — ABNORMAL LOW (ref >60)
GFR calc Af Amer: 55 mL/min — ABNORMAL LOW (ref >60)
GFR calc non Af Amer: 39 mL/min — ABNORMAL LOW (ref >60)
GFR calc non Af Amer: 48 mL/min — ABNORMAL LOW (ref >60)
Glucose, Bld: 156 mg/dL — ABNORMAL HIGH (ref 70–99)
Glucose, Bld: 86 mg/dL (ref 70–99)
Phosphorus: 3.7 mg/dL (ref 2.5–4.6)
Phosphorus: 3.7 mg/dL (ref 2.5–4.6)
Potassium: 3.8 mmol/L (ref 3.5–5.1)
Potassium: 4.1 mmol/L (ref 3.5–5.1)
Sodium: 127 mmol/L — ABNORMAL LOW (ref 135–145)
Sodium: 131 mmol/L — ABNORMAL LOW (ref 135–145)

## 2019-09-09 LAB — POCT I-STAT 7, (LYTES, BLD GAS, ICA,H+H)
Acid-base deficit: 4 mmol/L — ABNORMAL HIGH (ref 0.0–2.0)
Bicarbonate: 20.7 mmol/L (ref 20.0–28.0)
Calcium, Ion: 0.94 mmol/L — ABNORMAL LOW (ref 1.15–1.40)
HCT: 28 % — ABNORMAL LOW (ref 39.0–52.0)
Hemoglobin: 9.5 g/dL — ABNORMAL LOW (ref 13.0–17.0)
O2 Saturation: 100 %
Patient temperature: 98.6
Potassium: 3.8 mmol/L (ref 3.5–5.1)
Sodium: 131 mmol/L — ABNORMAL LOW (ref 135–145)
TCO2: 22 mmol/L (ref 22–32)
pCO2 arterial: 34.3 mmHg (ref 32.0–48.0)
pH, Arterial: 7.39 (ref 7.350–7.450)
pO2, Arterial: 179 mmHg — ABNORMAL HIGH (ref 83.0–108.0)

## 2019-09-09 LAB — COOXEMETRY PANEL
Carboxyhemoglobin: 1.6 % — ABNORMAL HIGH (ref 0.5–1.5)
Carboxyhemoglobin: 0.8 % (ref 0.5–1.5)
Methemoglobin: 1 % (ref 0.0–1.5)
Methemoglobin: 0.3 % (ref 0.0–1.5)
O2 Saturation: 99.4 %
O2 Saturation: 58.1 %
Total hemoglobin: 9.5 g/dL — ABNORMAL LOW (ref 12.0–16.0)
Total hemoglobin: 9 g/dL — ABNORMAL LOW (ref 12.0–16.0)

## 2019-09-09 LAB — GLUCOSE, CAPILLARY
Glucose-Capillary: 128 mg/dL — ABNORMAL HIGH (ref 70–99)
Glucose-Capillary: 155 mg/dL — ABNORMAL HIGH (ref 70–99)
Glucose-Capillary: 162 mg/dL — ABNORMAL HIGH (ref 70–99)
Glucose-Capillary: 209 mg/dL — ABNORMAL HIGH (ref 70–99)
Glucose-Capillary: 58 mg/dL — ABNORMAL LOW (ref 70–99)
Glucose-Capillary: 69 mg/dL — ABNORMAL LOW (ref 70–99)
Glucose-Capillary: 69 mg/dL — ABNORMAL LOW (ref 70–99)
Glucose-Capillary: 71 mg/dL (ref 70–99)
Glucose-Capillary: 92 mg/dL (ref 70–99)

## 2019-09-09 LAB — CULTURE, BLOOD (ROUTINE X 2): Special Requests: ADEQUATE

## 2019-09-09 LAB — MAGNESIUM: Magnesium: 2.1 mg/dL (ref 1.7–2.4)

## 2019-09-09 MED ORDER — PANTOPRAZOLE SODIUM 40 MG IV SOLR
40.0000 mg | INTRAVENOUS | Status: DC
  Administered 2019-09-09: 12:00:00 40 mg via INTRAVENOUS
  Filled 2019-09-09: qty 40

## 2019-09-09 MED ORDER — HEPARIN SODIUM (PORCINE) 5000 UNIT/ML IJ SOLN
5000.0000 [IU] | Freq: Three times a day (TID) | INTRAMUSCULAR | Status: DC
  Administered 2019-09-09 (×2): 5000 [IU] via SUBCUTANEOUS
  Filled 2019-09-09 (×2): qty 1

## 2019-09-09 MED ORDER — HYDROCORTISONE NA SUCCINATE PF 100 MG IJ SOLR
50.0000 mg | Freq: Four times a day (QID) | INTRAMUSCULAR | Status: DC
  Administered 2019-09-09 (×2): 50 mg via INTRAVENOUS
  Filled 2019-09-09 (×2): qty 2

## 2019-09-09 MED ORDER — METOCLOPRAMIDE HCL 5 MG/ML IJ SOLN
5.0000 mg | Freq: Three times a day (TID) | INTRAMUSCULAR | Status: DC
  Administered 2019-09-09 (×2): 5 mg via INTRAVENOUS
  Filled 2019-09-09 (×2): qty 2

## 2019-09-09 MED ORDER — DEXTROSE 50 % IV SOLN
INTRAVENOUS | Status: AC
  Administered 2019-09-09: 22:00:00 50 mL
  Filled 2019-09-09: qty 50

## 2019-09-09 MED ORDER — CHLORHEXIDINE GLUCONATE CLOTH 2 % EX PADS: 6.0000 | MEDICATED_PAD | Freq: Every day | CUTANEOUS | Status: DC

## 2019-09-09 MED ORDER — DEXTROSE 50 % IV SOLN
INTRAVENOUS | Status: AC
  Filled 2019-09-09: qty 50

## 2019-09-09 NOTE — Progress Notes (Signed)
Dr. Glenna Fellows made aware of pt having consistently low SBP's (60-70's) with maps 50-54 while infusing Levo@65 , Vaso, and Dobutamine@5  mcg.  Decision made to stop CRRT and return blood d/t patients' instability. Was able to return all of patients blood to him without difficulty.  Continuing to monitor patient.

## 2019-09-09 NOTE — Progress Notes (Signed)
Subjective: On ventilator and sedated  Antibiotics:  Anti-infectives (From admission, onward)   Start     Dose/Rate Route Frequency Ordered Stop   09/08/19 2200  ceFAZolin (ANCEF) IVPB 2g/100 mL premix     2 g 200 mL/hr over 30 Minutes Intravenous Every 12 hours 09/08/19 1255     09/21/2019 2100  cefTRIAXone (ROCEPHIN) 2 g in sodium chloride 0.9 % 100 mL IVPB  Status:  Discontinued     2 g 200 mL/hr over 30 Minutes Intravenous Every 24 hours 10/02/2019 1426 09/12/2019 1828   09/18/2019 2100  ceFAZolin (ANCEF) IVPB 2g/100 mL premix  Status:  Discontinued     2 g 200 mL/hr over 30 Minutes Intravenous Every 8 hours 09/21/2019 1828 09/08/19 1255   09/20/2019 1700  vancomycin (VANCOREADY) IVPB 500 mg/100 mL     500 mg 100 mL/hr over 60 Minutes Intravenous  Once 10/01/2019 1258 09/11/2019 1819   10/02/2019 0600  ceFAZolin (ANCEF) IVPB 2g/100 mL premix  Status:  Discontinued     2 g 200 mL/hr over 30 Minutes Intravenous To Short Stay 09/30/2019 0340 09/24/2019 1148   09/06/19 1400  vancomycin (VANCOREADY) IVPB 1500 mg/300 mL  Status:  Discontinued     1,500 mg 150 mL/hr over 120 Minutes Intravenous Every 24 hours 09/21/2019 1512 09/06/19 1257   09/06/19 1400  vancomycin (VANCOREADY) IVPB 1250 mg/250 mL  Status:  Discontinued     1,250 mg 166.7 mL/hr over 90 Minutes Intravenous Every 24 hours 09/06/19 1257 09/21/2019 1258   09/21/2019 2200  ceFEPIme (MAXIPIME) 2 g in sodium chloride 0.9 % 100 mL IVPB  Status:  Discontinued     2 g 200 mL/hr over 30 Minutes Intravenous Every 12 hours 09/23/2019 1509 09/29/2019 1426   09/09/2019 1345  vancomycin (VANCOREADY) IVPB 2000 mg/400 mL     2,000 mg 200 mL/hr over 120 Minutes Intravenous  Once 09/11/2019 1339 09/06/2019 2004   09/22/2019 1330  ceFEPIme (MAXIPIME) 2 g in sodium chloride 0.9 % 100 mL IVPB     2 g 200 mL/hr over 30 Minutes Intravenous  Once 09/17/2019 1324 09/06/2019 1440   09/14/2019 1330  metroNIDAZOLE (FLAGYL) IVPB 500 mg     500 mg 100 mL/hr over 60 Minutes  Intravenous  Once 09/13/2019 1324 09/11/2019 1515   09/11/2019 1330  vancomycin (VANCOCIN) IVPB 1000 mg/200 mL premix  Status:  Discontinued     1,000 mg 200 mL/hr over 60 Minutes Intravenous  Once 09/18/2019 1324 09/21/2019 1339      Medications: Scheduled Meds: . chlorhexidine gluconate (MEDLINE KIT)  15 mL Mouth Rinse BID  . Chlorhexidine Gluconate Cloth  6 each Topical Q0600  . Chlorhexidine Gluconate Cloth  6 each Topical Daily  . feeding supplement (PRO-STAT SUGAR FREE 64)  30 mL Per Tube QID  . Gerhardt's butt cream   Topical TID  . heparin injection (subcutaneous)  5,000 Units Subcutaneous Q8H  . mouth rinse  15 mL Mouth Rinse 10 times per day  . mupirocin ointment  1 application Nasal BID  . pantoprazole (PROTONIX) IV  40 mg Intravenous Q24H  . sodium chloride flush  10-40 mL Intracatheter Q12H   Continuous Infusions: .  prismasol BGK 4/2.5 500 mL/hr at 09/09/19 0659  .  prismasol BGK 4/2.5    . sodium chloride Stopped (09/24/2019 0914)  .  ceFAZolin (ANCEF) IV Stopped (09/09/19 0940)  . dexmedetomidine (PRECEDEX) IV infusion 1.2 mcg/kg/hr (09/09/19 1000)  . dextrose Stopped (09/09/19 0910)  .  DOBUTamine 5 mcg/kg/min (09/09/19 1000)  . feeding supplement (VITAL 1.5 CAL) Stopped (09/08/19 2100)  . norepinephrine (LEVOPHED) Adult infusion 32 mcg/min (09/09/19 1000)  . prismasol BGK 4/2.5 1,500 mL/hr at 09/09/19 0711  . sodium bicarbonate 150 mEq in dextrose 5% 1000 mL Stopped (09/09/19 0703)  . vasopressin (PITRESSIN) infusion - *FOR SHOCK*     PRN Meds:.sodium chloride, fentaNYL (SUBLIMAZE) injection, heparin, sodium chloride flush    Objective: Weight change: -0.8 kg  Intake/Output Summary (Last 24 hours) at 09/09/2019 1014 Last data filed at 09/09/2019 1000 Gross per 24 hour  Intake 5637.47 ml  Output 6359 ml  Net -721.53 ml   Blood pressure 90/76, pulse (!) 111, temperature (!) 90.1 F (32.3 C), temperature source Axillary, resp. rate (!) 26, height 5' 11" (1.803 m),  weight 84 kg, SpO2 100 %. Temp:  [90.1 F (32.3 C)-97.8 F (36.6 C)] 90.1 F (32.3 C) (02/05 0411) Pulse Rate:  [107-123] 111 (02/05 0814) Resp:  [22-29] 26 (02/05 0900) BP: (82-103)/(65-90) 90/76 (02/05 0900) SpO2:  [99 %-100 %] 100 % (02/05 0814) Arterial Line BP: (85-107)/(51-73) 96/66 (02/05 0900) FiO2 (%):  [30 %-40 %] 30 % (02/05 0814) Weight:  [84 kg] 84 kg (02/05 0434)  Physical Exam: General: Sedated on the ventilator HEENT: anicteric sclera, EOMI CVS tachycardic Chest: , Rhonchi Abdomen: soft non-distended,  Extremities: Right lower extremity wrapped edematous extremities  Neuro: nonfocal  CBC:    BMET Recent Labs    09/08/19 0427 09/08/19 0427 09/08/19 2154 09/09/19 0427  NA 126*   < > 126* 127*  K 4.5   < > 4.2 3.8  CL 93*  --   --  95*  CO2 15*  --   --  19*  GLUCOSE 152*  --   --  156*  BUN 71*  --   --  59*  CREATININE 2.28*  --   --  1.86*  CALCIUM 7.1*  --   --  6.7*   < > = values in this interval not displayed.     Liver Panel  Recent Labs    09/13/2019 1233 09/09/19 0427  PROT 6.0*  --   ALBUMIN 1.5* 1.2*  AST 112*  --   ALT 73*  --   ALKPHOS 109  --   BILITOT 7.5*  --        Sedimentation Rate No results for input(s): ESRSEDRATE in the last 72 hours. C-Reactive Protein No results for input(s): CRP in the last 72 hours.  Micro Results: Recent Results (from the past 720 hour(s))  Culture, blood (Routine x 2)     Status: Abnormal   Collection Time: 09/17/2019  1:04 PM   Specimen: BLOOD RIGHT ARM  Result Value Ref Range Status   Specimen Description BLOOD RIGHT ARM  Final   Special Requests   Final    BOTTLES DRAWN AEROBIC AND ANAEROBIC Blood Culture adequate volume   Culture  Setup Time   Final    IN BOTH AEROBIC AND ANAEROBIC BOTTLES GRAM POSITIVE COCCI IN CHAINS CRITICAL RESULT CALLED TO, READ BACK BY AND VERIFIED WITH: Denton Brick PHARMD 09/06/19 0041 JDW Performed at [facility], [place] [address] [place], [place]  [address]    Culture STREPTOCOCCUS GROUP G STAPHYLOCOCCUS AUREUS  (A)  Final   Report Status 09/09/2019 FINAL  Final   Organism ID, Bacteria STAPHYLOCOCCUS AUREUS  Final   Organism ID, Bacteria STREPTOCOCCUS GROUP G  Final      Susceptibility  Staphylococcus aureus - MIC*    CIPROFLOXACIN <=0.5 SENSITIVE Sensitive     ERYTHROMYCIN <=0.25 SENSITIVE Sensitive     GENTAMICIN <=0.5 SENSITIVE Sensitive     OXACILLIN 0.5 SENSITIVE Sensitive     TETRACYCLINE <=1 SENSITIVE Sensitive     VANCOMYCIN 1 SENSITIVE Sensitive     TRIMETH/SULFA <=10 SENSITIVE Sensitive     CLINDAMYCIN <=0.25 SENSITIVE Sensitive     RIFAMPIN <=0.5 SENSITIVE Sensitive     Inducible Clindamycin NEGATIVE Sensitive     * STAPHYLOCOCCUS AUREUS   Streptococcus group g - MIC*    CLINDAMYCIN >=1 RESISTANT Resistant     AMPICILLIN <=0.25 SENSITIVE Sensitive     ERYTHROMYCIN >=8 RESISTANT Resistant     VANCOMYCIN 0.5 SENSITIVE Sensitive     CEFTRIAXONE <=0.12 SENSITIVE Sensitive     LEVOFLOXACIN 0.5 SENSITIVE Sensitive     PENICILLIN Value in next row Sensitive      SENSITIVE<=0.06    * STREPTOCOCCUS GROUP G  Urine culture     Status: Abnormal   Collection Time: 09/22/2019  1:53 PM   Specimen: In/Out Cath Urine  Result Value Ref Range Status   Specimen Description IN/OUT CATH URINE  Final   Special Requests   Final    NONE Performed at [facility], [address] [place], [place] [address]    Culture >=100,000 COLONIES/mL STAPHYLOCOCCUS AUREUS (A)  Final   Report Status 09/08/2019 FINAL  Final   Organism ID, Bacteria STAPHYLOCOCCUS AUREUS (A)  Final      Susceptibility   Staphylococcus aureus - MIC*    CIPROFLOXACIN <=0.5 SENSITIVE Sensitive     GENTAMICIN <=0.5 SENSITIVE Sensitive     NITROFURANTOIN <=16 SENSITIVE Sensitive     OXACILLIN <=0.25 SENSITIVE Sensitive     TETRACYCLINE <=1 SENSITIVE Sensitive     VANCOMYCIN 1 SENSITIVE Sensitive     TRIMETH/SULFA <=10 SENSITIVE Sensitive     CLINDAMYCIN  <=0.25 SENSITIVE Sensitive     RIFAMPIN <=0.5 SENSITIVE Sensitive     Inducible Clindamycin NEGATIVE Sensitive     * >=100,000 COLONIES/mL STAPHYLOCOCCUS AUREUS  Respiratory Panel by RT PCR (Flu A&B, Covid) - Nasopharyngeal Swab     Status: None   Collection Time: 09/08/2019  1:53 PM   Specimen: Nasopharyngeal Swab  Result Value Ref Range Status   SARS Coronavirus 2 by RT PCR NEGATIVE NEGATIVE Final    Comment: (NOTE) SARS-CoV-2 target nucleic acids are NOT DETECTED. The SARS-CoV-2 RNA is generally detectable in upper respiratoy specimens during the acute phase of infection. The lowest concentration of SARS-CoV-2 viral copies this assay can detect is 131 copies/mL. A negative result does not preclude SARS-Cov-2 infection and should not be used as the sole basis for treatment or other patient management decisions. A negative result may occur with  improper specimen collection/handling, submission of specimen other than nasopharyngeal swab, presence of viral mutation(s) within the areas targeted by this assay, and inadequate number of viral copies (<131 copies/mL). A negative result must be combined with clinical observations, patient history, and epidemiological information. The expected result is Negative. Fact Sheet for Patients:  PinkCheek.be Fact Sheet for Healthcare Providers:  GravelBags.it This test is not yet ap proved or cleared by the [place] FDA and  has been authorized for detection and/or diagnosis of SARS-CoV-2 by FDA under an Emergency Use Authorization (EUA). This EUA will remain  in effect (meaning this test can be used) for the duration of the COVID-19 declaration under Section 564(b)(1) of the Act,  21 U.S.C. section 360bbb-3(b)(1), unless the authorization is terminated or revoked sooner.    Influenza A by PCR NEGATIVE NEGATIVE Final   Influenza B by PCR NEGATIVE NEGATIVE Final    Comment: (NOTE) The  Xpert Xpress SARS-CoV-2/FLU/RSV assay is intended as an aid in  the diagnosis of influenza from Nasopharyngeal swab specimens and  should not be used as a sole basis for treatment. Nasal washings and  aspirates are unacceptable for Xpert Xpress SARS-CoV-2/FLU/RSV  testing. Fact Sheet for Patients: PinkCheek.be Fact Sheet for Healthcare Providers: GravelBags.it This test is not yet approved or cleared by the [place] FDA and  has been authorized for detection and/or diagnosis of SARS-CoV-2 by  FDA under an Emergency Use Authorization (EUA). This EUA will remain  in effect (meaning this test can be used) for the duration of the  Covid-19 declaration under Section 564(b)(1) of the Act, 21  U.S.C. section 360bbb-3(b)(1), unless the authorization is  terminated or revoked. Performed at [facility], [place] [address], [place], [place] [address]   MRSA PCR Screening     Status: None   Collection Time: 10/01/2019  8:49 PM   Specimen: Nasal Mucosa; Nasopharyngeal  Result Value Ref Range Status   MRSA by PCR NEGATIVE NEGATIVE Final    Comment:        The GeneXpert MRSA Assay (FDA approved for NASAL specimens only), is one component of a comprehensive MRSA colonization surveillance program. It is not intended to diagnose MRSA infection nor to guide or monitor treatment for MRSA infections. Performed at [facility], [place] [address] [place], [place] [address]   Culture, blood (Routine x 2)     Status: Abnormal   Collection Time: 09/30/2019  9:15 PM   Specimen: BLOOD  Result Value Ref Range Status   Specimen Description BLOOD LEFT ARM  Final   Special Requests   Final    BOTTLES DRAWN AEROBIC ONLY Blood Culture results may not be optimal due to an inadequate volume of blood received in culture bottles   Culture  Setup Time   Final    GRAM POSITIVE COCCI AEROBIC BOTTLE ONLY CRITICAL RESULT CALLED TO, READ BACK BY AND  VERIFIED WITH: Salome Holmes [facility] 09/20/2019 1813 JDW    Culture (A)  Final    STAPHYLOCOCCUS AUREUS SUSCEPTIBILITIES PERFORMED ON PREVIOUS CULTURE WITHIN THE LAST 5 DAYS. Performed at [facility], [place] [address], [place], [place] [address]    Report Status 09/08/2019 FINAL  Final  Blood Culture ID Panel (Reflexed)     Status: Abnormal   Collection Time: 09/15/2019  9:15 PM  Result Value Ref Range Status   Enterococcus species NOT DETECTED NOT DETECTED Final   Listeria monocytogenes NOT DETECTED NOT DETECTED Final   Staphylococcus species DETECTED (A) NOT DETECTED Final    Comment: CRITICAL RESULT CALLED TO, READ BACK BY AND VERIFIED WITH: Salome Holmes [facility] 09/13/2019 1813 JDW    Staphylococcus aureus (BCID) DETECTED (A) NOT DETECTED Final    Comment: Methicillin (oxacillin) susceptible Staphylococcus aureus (MSSA). Preferred therapy is anti staphylococcal beta lactam antibiotic (Cefazolin or Nafcillin), unless clinically contraindicated. CRITICAL RESULT CALLED TO, READ BACK BY AND VERIFIED WITH: Salome Holmes [facility] 09/29/2019 1813 JDW    Methicillin resistance NOT DETECTED NOT DETECTED Final   Streptococcus species NOT DETECTED NOT DETECTED Final   Streptococcus agalactiae NOT DETECTED NOT DETECTED Final   Streptococcus pneumoniae NOT DETECTED NOT DETECTED Final   Streptococcus pyogenes NOT DETECTED NOT DETECTED Final  Acinetobacter baumannii NOT DETECTED NOT DETECTED Final   Enterobacteriaceae species NOT DETECTED NOT DETECTED Final   Enterobacter cloacae complex NOT DETECTED NOT DETECTED Final   Escherichia coli NOT DETECTED NOT DETECTED Final   Klebsiella oxytoca NOT DETECTED NOT DETECTED Final   Klebsiella pneumoniae NOT DETECTED NOT DETECTED Final   Proteus species NOT DETECTED NOT DETECTED Final   Serratia marcescens NOT DETECTED NOT DETECTED Final   Haemophilus influenzae NOT DETECTED NOT DETECTED Final   Neisseria meningitidis NOT DETECTED NOT DETECTED Final   Pseudomonas aeruginosa  NOT DETECTED NOT DETECTED Final   Candida albicans NOT DETECTED NOT DETECTED Final   Candida glabrata NOT DETECTED NOT DETECTED Final   Candida krusei NOT DETECTED NOT DETECTED Final   Candida parapsilosis NOT DETECTED NOT DETECTED Final   Candida tropicalis NOT DETECTED NOT DETECTED Final    Comment: Performed at [facility], [place] [address] [place], [place] [address]  Surgical PCR screen     Status: Abnormal   Collection Time: 09/06/19 11:20 PM   Specimen: Nasal Mucosa; Nasal Swab  Result Value Ref Range Status   MRSA, PCR NEGATIVE NEGATIVE Final   Staphylococcus aureus POSITIVE (A) NEGATIVE Final    Comment: CRITICAL RESULT CALLED TO, READ BACK BY AND VERIFIED WITH: RN MINDY HOPPER [id number] [phone]  Charles Blankenship Performed at [facility], [place] [address] [place], [place] [address]     Studies/Results: DG Chest Port 1 View  Result Date: 09/08/2019 CLINICAL DATA:  Central line placement EXAM: PORTABLE CHEST 1 VIEW COMPARISON:  Chest radiograph 10/01/2019 FINDINGS: Support Apparatus: --Endotracheal tube: Tip at the level of the clavicular heads. --Enteric tube:Tip and sideport are below the field of view. --Catheter(s):There is a right IJ approach central venous catheter with tip in the lower SVC. Dual lumen catheter from a left IJ approach also terminates in the lower right BC. Tip of a left approach PICC line is oriented superiorly within the right brachiocephalic vein. --Other: None Hazy opacities at the right lung base. Mild cardiomegaly. No pneumothorax or sizable pleural effusion. IMPRESSION: 1. Left approach PICC line tip is oriented superiorly within the right brachiocephalic vein. Recommend repositioning. 2. Hazy opacities at the right lung base may indicate developing consolidation. Electronically Signed   By: Ulyses Jarred M.D.   On: 09/08/2019 19:14   DG Abd Portable 1V  Result Date: 09/27/2019 CLINICAL DATA:  Abdominal distension. Cardiopulmonary arrest in the operating room  during leg amputation. EXAM: PORTABLE ABDOMEN - 1 VIEW COMPARISON:  One view abdomen 09/06/2019. FINDINGS: 1317 hours. Enteric tube has been placed, projecting to the level of the distal stomach. The stomach is decompressed. There is progressive diffuse small bowel distension. Some gas is present in the right colon, although the colon is relatively decompressed, and these findings suggest a developing partial small bowel obstruction. No supine evidence of free intraperitoneal air. Previous right total hip arthroplasty noted. IMPRESSION: Progressive diffuse small bowel distension suspicious for developing partial small bowel obstruction. Enteric tube extends to the distal stomach. Electronically Signed   By: Richardean Sale M.D.   On: 09/08/2019 13:29   ECHOCARDIOGRAM COMPLETE  Result Date: 09/12/2019   ECHOCARDIOGRAM REPORT   Patient Name:   Charles Blankenship Date of Exam: 09/20/2019 Medical Rec #:  [id number]          Height:       71.0 in Accession #:    [id number]         Weight:  186.9 lb Date of Birth:  08/10/1960           BSA:          2.05 m Patient Age:    59 years           BP:           102/81 mmHg Patient Gender: M                  HR:           127 bpm. Exam Location:  Inpatient Procedure: 2D Echo, Color Doppler and Cardiac Doppler Indications:    I50.9* Heart failure (unspecified)  History:        Patient has no prior history of Echocardiogram examinations.                 Risk Factors:Hypertension and Dyslipidemia.  Sonographer:    Raquel Sarna Senior RDCS Referring Phys: [id number] [place]  1. Left ventricular ejection fraction, by visual estimation, is 10-15%. The left ventricle has severely decreased function. There is no left ventricular hypertrophy. The septum and anterior wall are akinetic, inferior wall is severely hypokinetic, best preserved function in the lateral wall.  2. Severely dilated left ventricular internal cavity size.  3. Elevated left ventricular end-diastolic  pressure.  4. Global right ventricle has severely reduced systolic function.The right ventricular size is mildly enlarged. No increase in right ventricular wall thickness.  5. Moderately elevated pulmonary artery systolic pressure, 48 mmHg.  6. Left atrial size was severely dilated.  7. Right atrial size was moderately dilated.  8. The mitral valve is normal in structure. Moderate mitral valve regurgitation. No evidence of mitral stenosis.  9. The tricuspid valve is normal in structure. Tricuspid valve regurgitation is moderate. 10. The aortic valve is tricuspid. Aortic valve regurgitation is trivial. No evidence of aortic valve sclerosis or stenosis. 11. The pulmonic valve was normal in structure. Pulmonic valve regurgitation is mild. 12. The inferior vena cava is dilated in size with <50% respiratory variability, suggesting right atrial pressure of 15 mmHg. FINDINGS  Left Ventricle: Left ventricular ejection fraction, by visual estimation, is 10-15%. The left ventricle has severely decreased function. The left ventricle demonstrates regional wall motion abnormalities. The left ventricular internal cavity size was severely dilated left ventricle. There is no left ventricular hypertrophy. Left ventricular diastolic parameters are indeterminate. Elevated left ventricular end-diastolic pressure. Right Ventricle: The right ventricular size is mildly enlarged. No increase in right ventricular wall thickness. Global RV systolic function is has severely reduced systolic function. The tricuspid regurgitant velocity is 2.86 m/s, and with an assumed right atrial pressure of 15 mmHg, the estimated right ventricular systolic pressure is moderately elevated at 47.7 mmHg. Left Atrium: Left atrial size was severely dilated. Right Atrium: Right atrial size was moderately dilated Pericardium: There is no evidence of pericardial effusion. Mitral Valve: The mitral valve is normal in structure. Moderate mitral valve regurgitation,  with posteriorly-directed jet. No evidence of mitral valve stenosis by observation. Tricuspid Valve: The tricuspid valve is normal in structure. Tricuspid valve regurgitation is moderate. Aortic Valve: The aortic valve is tricuspid. Aortic valve regurgitation is trivial. The aortic valve is structurally normal, with no evidence of sclerosis or stenosis. Pulmonic Valve: The pulmonic valve was normal in structure. Pulmonic valve regurgitation is mild. Pulmonic regurgitation is mild. Aorta: The aortic root and ascending aorta are structurally normal, with no evidence of dilitation. Venous: The inferior vena cava is dilated in size with less  than 50% respiratory variability, suggesting right atrial pressure of 15 mmHg. IAS/Shunts: No atrial level shunt detected by color flow Doppler. There is no evidence of a patent foramen ovale.  LEFT VENTRICLE PLAX 2D LVIDd:         6.90 cm       Diastology LVIDs:         6.10 cm       LV e' lateral:   11.70 cm/s LV PW:         0.90 cm       LV E/e' lateral: 7.6 LV IVS:        0.70 cm       LV e' medial:    5.98 cm/s LVOT diam:     2.20 cm       LV E/e' medial:  15.0 LV SV:         60 ml LV SV Index:   29.12 LVOT Area:     3.80 cm  LV Volumes (MOD) LV area d, A2C:    55.60 cm LV area d, A4C:    54.90 cm LV area s, A2C:    49.60 cm LV area s, A4C:    46.20 cm LV major d, A2C:   10.90 cm LV major d, A4C:   10.20 cm LV major s, A2C:   9.99 cm LV major s, A4C:   9.48 cm LV vol d, MOD A2C: 239.0 ml LV vol d, MOD A4C: 246.0 ml LV vol s, MOD A2C: 214.0 ml LV vol s, MOD A4C: 191.0 ml LV SV MOD A2C:     25.0 ml LV SV MOD A4C:     246.0 ml LV SV MOD BP:      43.1 ml RIGHT VENTRICLE RV S prime:     4.90 cm/s TAPSE (M-mode): 0.8 cm LEFT ATRIUM              Index       RIGHT ATRIUM           Index LA diam:        4.60 cm  2.25 cm/m  RA Area:     24.30 cm LA Vol (A2C):   130.0 ml 63.45 ml/m RA Volume:   79.80 ml  38.95 ml/m LA Vol (A4C):   96.8 ml  47.24 ml/m LA Biplane Vol: 112.0 ml  54.66 ml/m  AORTIC VALVE LVOT Vmax:   60.70 cm/s LVOT Vmean:  38.900 cm/s LVOT VTI:    0.073 m  AORTA Ao Root diam: 3.10 cm Ao Asc diam:  3.20 cm MITRAL VALVE                        TRICUSPID VALVE MV Area (PHT): 6.37 cm             TR Peak grad:   32.7 mmHg MV PHT:        34.51 msec           TR Vmax:        286.00 cm/s MV Decel Time: 119 msec MR Peak grad: 75.7 mmHg             SHUNTS MR Mean grad: 46.0 mmHg             Systemic VTI:  0.07 m MR Vmax:      435.00 cm/s           Systemic Diam: 2.20 cm MR Vmean:  321.0 cm/s MV E velocity: 89.50 cm/s 103 cm/s MV A velocity: 34.70 cm/s 70.3 cm/s MV E/A ratio:  2.58       1.5  Cherlynn Kaiser MD Electronically signed by Cherlynn Kaiser MD Signature Date/Time: 09/08/2019/12:44:34 PM    Final       Assessment/Plan:  INTERVAL HISTORY:  CRRT started Principal Problem:   Staphylococcus aureus bacteremia Active Problems:   Sepsis ([place])   Severe protein-calorie malnutrition ([place])   Stasis dermatitis with venous ulcer of lower extremity due to chronic peripheral venous hypertension ([place])   Cardiogenic shock ([facility])    Thoma L Borchers is a 59 y.o. male with chronic right calcaneal osteomyelitis with prior MSSA and group G streptococcal infection then MRSA infection who is been admitted with septic shock due to progression of his calcaneal osteomyelitis and now with MSSA and group G strep in the blood status post cardiac arrest prior to planned trip to the OR for BKA.  Echocardiogram done post cardiac arrest showed him to have an EF of 10 to 15% and a dilated cardiomyopathy.  He does have a history of heavy alcohol abuse.  He is currently on pressors and dobutamine and CRRT   #1 MSSA bacteremia group G streptococcal bacteremia with septic shock and endorgan failure due to chronic osteomyelitis in right foot  -- cefazolin and renal dosing of this drug in light of CRRT --He will need BKA if he survives --Will need a line holiday if he survives ---  He also need to have an investigation for endocarditis with a TEE performed if he survives  Dr. Megan Salon is available for questions this weekend and will follow up on blood cultures.  I will be back on Monday.       LOS: 4 days   Charles Blankenship 09/09/2019, 10:14 AM

## 2019-09-09 NOTE — Progress Notes (Addendum)
NAME:  Charles Blankenship, MRN:  [id number], DOB:  1960/10/02, LOS: 4 ADMISSION DATE:  09-20-19, CONSULTATION DATE:  Sep 20, 2019 REFERRING MD: ED, Dr. Donnald Garre CHIEF COMPLAINT:  Septic shock  Brief History   59 year old gentleman with a history of gout, hypertension, recurrent osteomyelitis and neuropathy who presented on 09/20/19 evening with several days of decreased oral intake, nausea vomiting and worsening lower extremity pain and swelling.  Found to be cocaine positive,  MSSA bacteremia with associated mixed septic / cardiogenic shock in the setting of acute on chronic R foot osteomyelitis and LVEF of 10%.  Suffered 9 minute PEA arrest in OR prior to planned amputation.   Consults:  Orthopedic surgery  Procedures:  LUE PICC 2/2 >>  R Radial A-Line 2/2 >>   Significant Diagnostic Tests:  UDS 2/1 >> cocaine positive  Foot Xray 2/2 >> acute on chronic right heel osteomyelitis.  ECHO 2/3 >> LVEF ~ 10-15%, severely reduced function, severely dilated LV, elevated LVEDP, global RV severely reduced systolic function, moderately elevated PA pressure ( ), LA severely dilated, RA moderately dilated  Micro Data:  COVID 2/1 >> negative  Influenza A/B 2/1 >> negative  MRSA PCXR 2/1 >> positive UC 2/1 >> 100k staph aureus  BCID 2/1 >> MSSA detected  BCx2 2/4 >> MSSA >>             Strep group G >>  Antimicrobials:  Vancomycin 2/1 >> 2/3 Cefepime 2/1 >> 2/3 Flagyl 2/1 >> 2/1 Ancef 2/3 >>  Interim history/subjective:  RN reports pt with hypothermia > temp 90.1, pt remains on dobutamine, levophed gtt's.  Periods of intermittent agitation. CRRT with goal of net neg 115ml/hr  I/O - UOP 225, CRRT 3.1L / net even for 24 hours  Glucose range  34 -160  Objective   Blood pressure 90/76, pulse (!) 111, temperature (!) 90.1 F (32.3 C), temperature source Axillary, resp. rate (!) 26, height 5\' 11"  (1.803 m), weight 84 kg, SpO2 100 %. CVP:  [10 mmHg-20 mmHg] 11 mmHg  Vent Mode: PRVC FiO2  (%):  [30 %-40 %] 30 % Set Rate:  [26 bmp] 26 bmp Vt Set:  [600 mL] 600 mL PEEP:  [5 cmH20] 5 cmH20 Plateau Pressure:  [18 cmH20-21 cmH20] 19 cmH20   Intake/Output Summary (Last 24 hours) at 09/09/2019 0935 Last data filed at 09/09/2019 0900 Gross per 24 hour  Intake 5623.56 ml  Output 6151 ml  Net -527.44 ml   Filed Weights   09/27/2019 0154 09/21/2019 1017 09/09/19 0434  Weight: 84.8 kg 84.8 kg 84 kg    Examination: General: critically ill appearing adult male lying in bed on vent, CRRT in progress HEENT: MM pink/dry, ETT, enucleated R eye Neuro: sedate on precedex CV: s1s2 RRR, tachy, no m/r/g PULM:  Non-labored on vent, lungs bilaterally clear GI: soft, bsx4 active  Extremities: warm/dry, 1+ BLE edema, erythema from knees down, left leg weeping, bilateral lower extremity dressings Skin: no rashes or lesions    Assessment & Plan:   Mixed Cardiogenic, Septic Shock  Post Cardiac Arrest  Cocaine Abuse  Low SvO2, suspect largest component is cardiogenic  -appreciate Cardiology input  -continue levophed, dobutamine  -follow SvO2  -volume removal with UF  -will need ischemic evaluation if recovers from acute critical illness   MSSA, Group G Bacteremia  -ID following, appreciate input  -continue ancef  -if stabilizes, will need TEE   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 Moves extremities on exam but not purposeful.  -PAD protocol with precedex, fentanyl   -minimize sedation as able  -frequent neuro checks -pending hemodynamic stability, he may need neuroimaging to assist with neuro  prognostication  Hypothermia  -warming measures   Oliguric AKI  Lactic Acidosis  Hyponatremia  Suspect ischemic ATN with cardiac arrest.  -CVVHD per Nephrology, appreciate assistance with patients care -Trend BMP / urinary output -Replace electrolytes as indicated -Avoid nephrotoxic agents, ensure adequate renal perfusion -stop sodium bicarb   Hypoglycemia  -D10 at 29ml/hr   SBO -NPO  -NGT to LIS  -Hold TF for now, restart trickle trial in am 2/6 -reglan 5mg  IV Q8 x3 doses   Best practice:  Diet: NPO Pain/Anxiety/Delirium protocol (if indicated): PAD protocol  DVT prophylaxis: Sq Heparin GI prophylaxis: PPI Glucose control: D10 infusion Mobility: BR Code Status: Full Disposition: ICU   Labs   CBC: Recent Labs  Lab 09/15/2019 1303 09/18/2019 1303 09/06/19 0330 09/06/19 2211 16-Sep-2019 0323 Sep 16, 2019 0947 Sep 16, 2019 1116 09-16-19 1233 2019-09-16 1235 09-16-19 1508 09/08/19 2154  WBC 10.9*  --  5.6  --  9.2  --   --  9.3  --   --   --   NEUTROABS 9.7*  --   --   --   --   --   --   --   --   --   --   HGB 11.2*   < > 9.2*   < > 9.0*   < > 8.8* 8.1* 8.8* 9.5* 8.8*  HCT 33.6*   < > 27.2*   < > 26.1*   < > 26.0* 24.5* 26.0* 28.0* 26.0*  MCV 75.0*  --  73.9*  --  72.3*  --   --  75.6*  --   --   --   PLT 133*  --  80*  --  92*  --   --  113*  --   --   --    < > = values in this interval not displayed.    Basic Metabolic Panel: Recent Labs  Lab 10/02/2019 1303 09/30/2019 1303 09/06/19 0330 09/06/19 2211 09-16-2019 0323 Sep 16, 2019 0947 16-Sep-2019 1233 09/16/19 1233 2019/09/16 1235 16-Sep-2019 1508 09/08/19 0427 09/08/19 2154 09/09/19 0427  NA 132*   < > 130*   < > 126*   < > 130*   < > 128* 128* 126* 126* 127*  K 4.4   < > 4.6   < > 4.6   < > 4.8   < > 4.6 4.7 4.5 4.2 3.8  CL 98   < > 101  --  98  --  99  --   --   --  93*  --  95*  CO2 16*   < > 13*  --  12*  --  13*  --   --   --  15*  --  19*  GLUCOSE 38*   < > 139*  --  121*  --  34*  --   --   --  152*  --  156*   BUN 46*   < > 50*  --  61*  --  63*  --   --   --  71*  --  59*  CREATININE 1.60*   < > 1.58*  --  1.90*  --  2.17*  --   --   --  2.28*  --  1.86*  CALCIUM 8.4*   < > 7.5*  --  7.5*  --  8.2*  --   --   --  7.1*  --  6.7*  MG 1.9  --  1.7  --   --   --   --   --   --   --   --   --  2.1  PHOS 4.2  --   --   --   --   --   --   --   --   --   --   --  3.7   < > = values in this interval not displayed.   GFR: Estimated Creatinine Clearance: 46.1 mL/min (A) (by C-G formula based on SCr of 1.86 mg/dL (H)). Recent Labs  Lab 09/20/2019 1303 09/12/2019 1833 09/06/19 0040 09/06/19 0330 09/06/19 1602 Oct 02, 2019 0323 10/02/19 1230 2019/10/02 1233  WBC 10.9*  --   --  5.6  --  9.2  --  9.3  LATICACIDVEN 4.1*   < > 7.3* 7.3* 6.0*  --  6.4*  --    < > = values in this interval not displayed.    Liver Function Tests: Recent Labs  Lab 09/25/2019 1303 09/06/19 0330 2019-10-02 1233 09/09/19 0427  AST 137* 97* 112*  --   ALT 92* 68* 73*  --   ALKPHOS 144* 98 109  --   BILITOT 6.0* 5.8* 7.5*  --   PROT 7.1 5.5* 6.0*  --   ALBUMIN 1.5* 1.0* 1.5* 1.2*   No results for input(s): LIPASE, AMYLASE in the last 168 hours. Recent Labs  Lab 09/16/2019 1341  AMMONIA 19    ABG    Component Value Date/Time   PHART 7.411 09/08/2019 2154   PCO2ART 29.4 (L) 09/08/2019 2154   PO2ART 152.0 (H) 09/08/2019 2154   HCO3 18.8 (L) 09/08/2019 2154   TCO2 20 (L) 09/08/2019 2154   ACIDBASEDEF 5.0 (H) 09/08/2019 2154   O2SAT 99.4 09/09/2019 0427     Coagulation Profile: Recent Labs  Lab 09/06/2019 1303 09/06/19 0330 10/02/19 1402 09/08/19 0427  INR 1.9* 2.2* 2.2* 2.5*    Cardiac Enzymes: No results for input(s): CKTOTAL, CKMB, CKMBINDEX, TROPONINI in the last 168 hours.  HbA1C: No results found for: HGBA1C  CBG: Recent Labs  Lab 09/08/19 1535 09/08/19 1940 09/08/19 2338 09/09/19 0334 09/09/19 0750  GLUCAP 117* 135* 134* 155* 128*    Critical care time:  64 minutes      Noe Gens, MSN,  NP-C [facility] 09/09/2019, 10:31 AM   Please see [email or web address] for pager details.

## 2019-09-09 NOTE — Progress Notes (Signed)
Patient ID: Charles Blankenship, male   DOB: 1960/11/11, 59 y.o.   MRN: [id number]     Advanced Heart Failure Rounding Note  PCP-Cardiologist: No primary care provider on file.   Subjective:    Remains on dobutamine 5 mcg + norepi 25 mcg. Co-ox not accurate this morning.   He was started no CVVH last night, net UF at 150 cc/hr.  Making minimal urine.  CVP 11-12 this morning (improved).   Remains intubated 30%.  CXR with hazy opacity right base. Ongoing cefazolin for MSSA and group G Strep.   Per nurse, will open eyes and moves extremities, not sure if purposeful.    Objective:   Weight Range: 84 kg Body mass index is 25.83 kg/m.   Vital Signs:   Temp:  [90.1 F (32.3 C)-97.8 F (36.6 C)] 90.1 F (32.3 C) (02/05 0411) Pulse Rate:  [107-123] 111 (02/05 0814) Resp:  [22-34] 26 (02/05 0814) BP: (82-103)/(65-90) 82/72 (02/05 0814) SpO2:  [99 %-100 %] 100 % (02/05 0814) Arterial Line BP: (85-107)/(51-73) 85/60 (02/05 0800) FiO2 (%):  [30 %-40 %] 30 % (02/05 0814) Weight:  [84 kg] 84 kg (02/05 0434) Last BM Date: 10/02/2019  Weight change: Filed Weights   09/12/2019 0154 09/06/2019 1017 09/09/19 0434  Weight: 84.8 kg 84.8 kg 84 kg    Intake/Output:   Intake/Output Summary (Last 24 hours) at 09/09/2019 0857 Last data filed at 09/09/2019 0800 Gross per 24 hour  Intake 5703.95 ml  Output 5842 ml  Net -138.05 ml      Physical Exam   CVP 11-12 General: Intubated/sedated Neck: JVP 10 cm, no thyromegaly or thyroid nodule.  Lungs: Decreased at bases.  CV: Nondisplaced PMI.  Heart mildly tachy, regular S1/S2, no S3/S4, no murmur.  Trace ankle edema.   Abdomen: Soft, nontender, no hepatosplenomegaly, no distention.  Skin: Intact without lesions or rashes.  Neurologic: Sedated on vent  Extremities: No clubbing or cyanosis.  HEENT: Normal.    Telemetry  Sinus Tachy 110 (personally reviewed)  EKG    N/a   Labs    CBC Recent Labs    09/26/2019 0323 09/23/2019 0947  09/19/2019 1233 09/20/2019 1235 09/17/2019 1508 09/08/19 2154  WBC 9.2  --  9.3  --   --   --   HGB 9.0*   < > 8.1*   < > 9.5* 8.8*  HCT 26.1*   < > 24.5*   < > 28.0* 26.0*  MCV 72.3*  --  75.6*  --   --   --   PLT 92*  --  113*  --   --   --    < > = values in this interval not displayed.   Basic Metabolic Panel Recent Labs    09/08/19 0427 09/08/19 0427 09/08/19 2154 09/09/19 0427  NA 126*   < > 126* 127*  K 4.5   < > 4.2 3.8  CL 93*  --   --  95*  CO2 15*  --   --  19*  GLUCOSE 152*  --   --  156*  BUN 71*  --   --  59*  CREATININE 2.28*  --   --  1.86*  CALCIUM 7.1*  --   --  6.7*  MG  --   --   --  2.1  PHOS  --   --   --  3.7   < > = values in this interval not displayed.   Liver Function Tests  Recent Labs    09/08/2019 1233 09/09/19 0427  AST 112*  --   ALT 73*  --   ALKPHOS 109  --   BILITOT 7.5*  --   PROT 6.0*  --   ALBUMIN 1.5* 1.2*   No results for input(s): LIPASE, AMYLASE in the last 72 hours. Cardiac Enzymes No results for input(s): CKTOTAL, CKMB, CKMBINDEX, TROPONINI in the last 72 hours.  BNP: BNP (last 3 results) No results for input(s): BNP in the last 8760 hours.  ProBNP (last 3 results) No results for input(s): PROBNP in the last 8760 hours.   D-Dimer No results for input(s): DDIMER in the last 72 hours. Hemoglobin A1C No results for input(s): HGBA1C in the last 72 hours. Fasting Lipid Panel No results for input(s): CHOL, HDL, LDLCALC, TRIG, CHOLHDL, LDLDIRECT in the last 72 hours. Thyroid Function Tests No results for input(s): TSH, T4TOTAL, T3FREE, THYROIDAB in the last 72 hours.  Invalid input(s): FREET3  Other results:   Imaging    DG Chest Port 1 View  Result Date: 09/08/2019 CLINICAL DATA:  Central line placement EXAM: PORTABLE CHEST 1 VIEW COMPARISON:  Chest radiograph 09/25/2019 FINDINGS: Support Apparatus: --Endotracheal tube: Tip at the level of the clavicular heads. --Enteric tube:Tip and sideport are below the field of  view. --Catheter(s):There is a right IJ approach central venous catheter with tip in the lower SVC. Dual lumen catheter from a left IJ approach also terminates in the lower right BC. Tip of a left approach PICC line is oriented superiorly within the right brachiocephalic vein. --Other: None Hazy opacities at the right lung base. Mild cardiomegaly. No pneumothorax or sizable pleural effusion. IMPRESSION: 1. Left approach PICC line tip is oriented superiorly within the right brachiocephalic vein. Recommend repositioning. 2. Hazy opacities at the right lung base may indicate developing consolidation. Electronically Signed   By: Ulyses Jarred M.D.   On: 09/08/2019 19:14     Medications:     Scheduled Medications: . sodium chloride   Intravenous Once  . sodium chloride   Intravenous Once  . chlorhexidine gluconate (MEDLINE KIT)  15 mL Mouth Rinse BID  . Chlorhexidine Gluconate Cloth  6 each Topical Q0600  . Chlorhexidine Gluconate Cloth  6 each Topical Daily  . feeding supplement (PRO-STAT SUGAR FREE 64)  30 mL Per Tube QID  . Gerhardt's butt cream   Topical TID  . mouth rinse  15 mL Mouth Rinse 10 times per day  . mupirocin ointment  1 application Nasal BID  . sodium chloride flush  10-40 mL Intracatheter Q12H    Infusions: .  prismasol BGK 4/2.5 500 mL/hr at 09/09/19 0659  .  prismasol BGK 4/2.5    . sodium chloride Stopped (09/18/2019 0914)  .  ceFAZolin (ANCEF) IV Stopped (09/08/19 2330)  . dexmedetomidine (PRECEDEX) IV infusion 1.2 mcg/kg/hr (09/09/19 0800)  . dextrose 30 mL/hr at 09/09/19 0800  . DOBUTamine 5 mcg/kg/min (09/09/19 0800)  . feeding supplement (VITAL 1.5 CAL) Stopped (09/08/19 2100)  . norepinephrine (LEVOPHED) Adult infusion 25 mcg/min (09/09/19 0800)  . prismasol BGK 4/2.5 1,500 mL/hr at 09/09/19 0711  . sodium bicarbonate 150 mEq in dextrose 5% 1000 mL Stopped (09/09/19 0703)  . vasopressin (PITRESSIN) infusion - *FOR SHOCK*      PRN Medications: sodium chloride,  fentaNYL (SUBLIMAZE) injection, fentaNYL (SUBLIMAZE) injection, heparin, sodium chloride flush   Assessment/Plan   1. Shock: Mixed septic/cardiogenic.  Echo with EF 10-15%, severely decreased RV systolic function.  Had history of ETOH  abuse prior to admission, also cannot rule out coronary disease (did not have ACS pattern Hs-Troponin trend).  Could also be stress cardiomyopathy related to sepsis.  Started on CVVH yesterday, volume status improved with CVP 11-12 today.  He remains on dobutamine 5 + NE 25, MAP stable.  Co-ox not accurate this morning.   - Continue dobutamine 5, can titrate NE as able.   - Continue UF at 100-150 cc/hr via CVVH.  Volume improving.  - Eventual ischemic evaluation if recovers.  - Not candidate for mechanical support with bacteremia.  2. ID: Component of septic shock.  Calcaneal osteomyelitis now with MSSA and Group G strep bacteremia.   - Cefazolin.  - ID following.  - Will need eventual TEE.  Do not have do now as will not change current management.  - Eventually needs right BKA.  3. AKI: In setting of shock.  Now on CVVH per renal.  - Continue UF 100-150 cc/hr today net, probably will be near-euvolemic tomorrow.  4. PEA arrest 2/3, 9 minutes CPR.  5. Neuro: Moves limbs, not sure if purposeful.   Still suspect poor prognosis.  If he recovers and stabilizes, will need right BKA.    CRITICAL CARE Performed by: Loralie Champagne  Total critical care time: 35 minutes  Critical care time was exclusive of separately billable procedures and treating other patients.  Critical care was necessary to treat or prevent imminent or life-threatening deterioration.  Critical care was time spent personally by me on the following activities: development of treatment plan with patient and/or surrogate as well as nursing, discussions with consultants, evaluation of patient's response to treatment, examination of patient, obtaining history from patient or surrogate, ordering and  performing treatments and interventions, ordering and review of laboratory studies, ordering and review of radiographic studies, pulse oximetry and re-evaluation of patient's condition.  Loralie Champagne 09/09/2019 8:57 AM

## 2019-09-09 NOTE — Progress Notes (Signed)
Rec'd call from patient's RN that SBP running in 60-70s despite NE 65, vaso, dobutamine 5.  I requested she return the blood and hold CRRT at this time.  This degree of hemodynamic instability is incompatible with CRRT.  Should his hemodynamics improve we can resume.

## 2019-09-09 NOTE — Progress Notes (Signed)
eLink Physician-Brief Progress Note Patient Name: Charles Blankenship DOB: [birth year] MRN: [id number]   Date of Service  09/09/2019  HPI/Events of Note  Hyperglycemia - Blood glucose trending up to 155.   eICU Interventions  Will decrease D10W IV infusion to 30 mL/hour.      Intervention Category Major Interventions: Hyperglycemia - active titration of insulin therapy  Sommer,Steven Dennard Nip 09/09/2019, 3:39 AM

## 2019-09-09 NOTE — Progress Notes (Signed)
[facility] NEPHROLOGY PROGRESS NOTE  Assessment/ Plan: Pt is a 59 y.o. yo male with HTN, HLD, chronic systolic CHF with EF 42-70%, recurrent ostomyelitis, seen for AKI and oliguria.  Patient with MSSA and a strep bacteremia thought to be due to osteomyelitis, was going to get right BKA on 2/3 but had a cardiac arrest in the OR with 9 minutes CPR and ROSC.  # AKI with oliguria: ATN due to profound shock, cardiac arrest.  Volume overloaded.  Started  CRRT on 2/4.  He is a poor candidate for long-term dialysis and this should be a time- limited trial.  Attemp 50 cc/hr net UF of BP allows, d/w ICU RN. Monitor electrolytes.  pH 7.41, discontinue sodium bicarb and IV.  #Cardiogenic/septic shock: Currently on Levophed and dobutamine.  Monitor BP.  On antibiotics  # Cardiac arrest: s/p 9 min of CPR with ROSC.  Post cardiac arrest Echo with EF of 10 to 15%.  Heart failure team is following.  # MSSA and Strep G bacteremia sepsis with multiorgan failure: on Cefazolin.  ID is following.  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  Looks like prognosis is grim.  Subjective: Seen and examined ICU.  Tolerating CRRT well with mostly UF around 50 cc an hour.  Blood pressure remains low on pressure.  Currently intubated.  No other event. Objective Vital signs in last 24 hours: Vitals:   09/09/19 0434 09/09/19 0500 09/09/19 0600 09/09/19 0700  BP:  (!) 85/68 93/74 (!) 88/76  Pulse:      Resp:  (!) 23 (!) 26 (!) 26  Temp:      TempSrc:      SpO2:      Weight: 84 kg     Height:       Weight change: -0.8 kg  Intake/Output Summary (Last 24 hours) at 09/09/2019 0805 Last data filed at 09/09/2019 0600 Gross per 24 hour  Intake 5423.4 ml  Output 5553 ml  Net -129.6 ml       Labs: Basic Metabolic Panel: Recent Labs  Lab 09/20/2019 1303 09/06/19 0330 09/15/2019 1233 09/23/2019 1235 09/08/19 0427 09/08/19 2154 09/09/19 0427  NA 132*   < > 130*   < >  126* 126* 127*  K 4.4   < > 4.8   < > 4.5 4.2 3.8  CL 98   < > 99  --  93*  --  95*  CO2 16*   < > 13*  --  15*  --  19*  GLUCOSE 38*   < > 34*  --  152*  --  156*  BUN 46*   < > 63*  --  71*  --  59*  CREATININE 1.60*   < > 2.17*  --  2.28*  --  1.86*  CALCIUM 8.4*   < > 8.2*  --  7.1*  --  6.7*  PHOS 4.2  --   --   --   --   --  3.7   < > = values in this interval not displayed.   Liver Function Tests: Recent Labs  Lab 09/08/2019 1303 09/25/2019 1303 09/06/19 0330 09/27/2019 1233 09/09/19 0427  AST 137*  --  97* 112*  --   ALT 92*  --  68* 73*  --   ALKPHOS 144*  --  98 109  --   BILITOT 6.0*  --  5.8* 7.5*  --   PROT 7.1  --  5.5* 6.0*  --   ALBUMIN 1.5*   < > 1.0* 1.5* 1.2*   < > = values in this interval not displayed.   No results for input(s): LIPASE, AMYLASE in the last 168 hours. Recent Labs  Lab 09/19/2019 1341  AMMONIA 19   CBC: Recent Labs  Lab 09/20/2019 1303 10/02/2019 1303 09/06/19 0330 09/06/19 2211 09/11/2019 0323 09/24/2019 0947 09/15/2019 1233 09/21/2019 1233 09/17/2019 1235 09/11/2019 1508 09/08/19 2154  WBC 10.9*   < > 5.6  --  9.2  --  9.3  --   --   --   --   NEUTROABS 9.7*  --   --   --   --   --   --   --   --   --   --   HGB 11.2*   < > 9.2*   < > 9.0*   < > 8.1*   < > 8.8* 9.5* 8.8*  HCT 33.6*   < > 27.2*   < > 26.1*   < > 24.5*   < > 26.0* 28.0* 26.0*  MCV 75.0*  --  73.9*  --  72.3*  --  75.6*  --   --   --   --   PLT 133*   < > 80*  --  92*  --  113*  --   --   --   --    < > = values in this interval not displayed.   Cardiac Enzymes: No results for input(s): CKTOTAL, CKMB, CKMBINDEX, TROPONINI in the last 168 hours. CBG: Recent Labs  Lab 09/08/19 1535 09/08/19 1940 09/08/19 2338 09/09/19 0334 09/09/19 0750  GLUCAP 117* 135* 134* 155* 128*    Iron Studies: No results for input(s): IRON, TIBC, TRANSFERRIN, FERRITIN in the last 72 hours. Studies/Results: DG Chest Port 1 View  Result Date: 09/08/2019 CLINICAL DATA:  Central line placement  EXAM: PORTABLE CHEST 1 VIEW COMPARISON:  Chest radiograph 09/15/2019 FINDINGS: Support Apparatus: --Endotracheal tube: Tip at the level of the clavicular heads. --Enteric tube:Tip and sideport are below the field of view. --Catheter(s):There is a right IJ approach central venous catheter with tip in the lower SVC. Dual lumen catheter from a left IJ approach also terminates in the lower right BC. Tip of a left approach PICC line is oriented superiorly within the right brachiocephalic vein. --Other: None Hazy opacities at the right lung base. Mild cardiomegaly. No pneumothorax or sizable pleural effusion. IMPRESSION: 1. Left approach PICC line tip is oriented superiorly within the right brachiocephalic vein. Recommend repositioning. 2. Hazy opacities at the right lung base may indicate developing consolidation. Electronically Signed   By: Ulyses Jarred M.D.   On: 09/08/2019 19:14   DG Abd Portable 1V  Result Date: 09/18/2019 CLINICAL DATA:  Abdominal distension. Cardiopulmonary arrest in the operating room during leg amputation. EXAM: PORTABLE ABDOMEN - 1 VIEW COMPARISON:  One view abdomen 09/06/2019. FINDINGS: 1317 hours. Enteric tube has been placed, projecting to the level of the distal stomach. The stomach is decompressed. There is progressive diffuse small bowel distension. Some gas is present in the right colon, although the colon is relatively decompressed, and these findings suggest a developing partial small bowel obstruction. No supine evidence of free intraperitoneal air. Previous right total hip arthroplasty noted. IMPRESSION: Progressive diffuse small bowel distension suspicious for developing partial small bowel obstruction. Enteric tube extends to the distal stomach. Electronically Signed   By: Richardean Sale M.D.   On: 09/12/2019 13:29  ECHOCARDIOGRAM COMPLETE  Result Date: 09/19/2019   ECHOCARDIOGRAM REPORT   Patient Name:   Charles Blankenship Date of Exam: 09/13/2019 Medical Rec #:  [id number]           Height:       71.0 in Accession #:    [id number]         Weight:       186.9 lb Date of Birth:  1961/07/03           BSA:          2.05 m Patient Age:    11 years           BP:           102/81 mmHg Patient Gender: M                  HR:           127 bpm. Exam Location:  Inpatient Procedure: 2D Echo, Color Doppler and Cardiac Doppler Indications:    I50.9* Heart failure (unspecified)  History:        Patient has no prior history of Echocardiogram examinations.                 Risk Factors:Hypertension and Dyslipidemia.  Sonographer:    Raquel Sarna Senior RDCS Referring Phys: [id number] [place]  1. Left ventricular ejection fraction, by visual estimation, is 10-15%. The left ventricle has severely decreased function. There is no left ventricular hypertrophy. The septum and anterior wall are akinetic, inferior wall is severely hypokinetic, best preserved function in the lateral wall.  2. Severely dilated left ventricular internal cavity size.  3. Elevated left ventricular end-diastolic pressure.  4. Global right ventricle has severely reduced systolic function.The right ventricular size is mildly enlarged. No increase in right ventricular wall thickness.  5. Moderately elevated pulmonary artery systolic pressure, 48 mmHg.  6. Left atrial size was severely dilated.  7. Right atrial size was moderately dilated.  8. The mitral valve is normal in structure. Moderate mitral valve regurgitation. No evidence of mitral stenosis.  9. The tricuspid valve is normal in structure. Tricuspid valve regurgitation is moderate. 10. The aortic valve is tricuspid. Aortic valve regurgitation is trivial. No evidence of aortic valve sclerosis or stenosis. 11. The pulmonic valve was normal in structure. Pulmonic valve regurgitation is mild. 12. The inferior vena cava is dilated in size with <50% respiratory variability, suggesting right atrial pressure of 15 mmHg. FINDINGS  Left Ventricle: Left ventricular ejection fraction,  by visual estimation, is 10-15%. The left ventricle has severely decreased function. The left ventricle demonstrates regional wall motion abnormalities. The left ventricular internal cavity size was severely dilated left ventricle. There is no left ventricular hypertrophy. Left ventricular diastolic parameters are indeterminate. Elevated left ventricular end-diastolic pressure. Right Ventricle: The right ventricular size is mildly enlarged. No increase in right ventricular wall thickness. Global RV systolic function is has severely reduced systolic function. The tricuspid regurgitant velocity is 2.86 m/s, and with an assumed right atrial pressure of 15 mmHg, the estimated right ventricular systolic pressure is moderately elevated at 47.7 mmHg. Left Atrium: Left atrial size was severely dilated. Right Atrium: Right atrial size was moderately dilated Pericardium: There is no evidence of pericardial effusion. Mitral Valve: The mitral valve is normal in structure. Moderate mitral valve regurgitation, with posteriorly-directed jet. No evidence of mitral valve stenosis by observation. Tricuspid Valve: The tricuspid valve is normal in structure. Tricuspid valve regurgitation is moderate. Aortic Valve:  The aortic valve is tricuspid. Aortic valve regurgitation is trivial. The aortic valve is structurally normal, with no evidence of sclerosis or stenosis. Pulmonic Valve: The pulmonic valve was normal in structure. Pulmonic valve regurgitation is mild. Pulmonic regurgitation is mild. Aorta: The aortic root and ascending aorta are structurally normal, with no evidence of dilitation. Venous: The inferior vena cava is dilated in size with less than 50% respiratory variability, suggesting right atrial pressure of 15 mmHg. IAS/Shunts: No atrial level shunt detected by color flow Doppler. There is no evidence of a patent foramen ovale.  LEFT VENTRICLE PLAX 2D LVIDd:         6.90 cm       Diastology LVIDs:         6.10 cm       LV e'  lateral:   11.70 cm/s LV PW:         0.90 cm       LV E/e' lateral: 7.6 LV IVS:        0.70 cm       LV e' medial:    5.98 cm/s LVOT diam:     2.20 cm       LV E/e' medial:  15.0 LV SV:         60 ml LV SV Index:   29.12 LVOT Area:     3.80 cm  LV Volumes (MOD) LV area d, A2C:    55.60 cm LV area d, A4C:    54.90 cm LV area s, A2C:    49.60 cm LV area s, A4C:    46.20 cm LV major d, A2C:   10.90 cm LV major d, A4C:   10.20 cm LV major s, A2C:   9.99 cm LV major s, A4C:   9.48 cm LV vol d, MOD A2C: 239.0 ml LV vol d, MOD A4C: 246.0 ml LV vol s, MOD A2C: 214.0 ml LV vol s, MOD A4C: 191.0 ml LV SV MOD A2C:     25.0 ml LV SV MOD A4C:     246.0 ml LV SV MOD BP:      43.1 ml RIGHT VENTRICLE RV S prime:     4.90 cm/s TAPSE (M-mode): 0.8 cm LEFT ATRIUM              Index       RIGHT ATRIUM           Index LA diam:        4.60 cm  2.25 cm/m  RA Area:     24.30 cm LA Vol (A2C):   130.0 ml 63.45 ml/m RA Volume:   79.80 ml  38.95 ml/m LA Vol (A4C):   96.8 ml  47.24 ml/m LA Biplane Vol: 112.0 ml 54.66 ml/m  AORTIC VALVE LVOT Vmax:   60.70 cm/s LVOT Vmean:  38.900 cm/s LVOT VTI:    0.073 m  AORTA Ao Root diam: 3.10 cm Ao Asc diam:  3.20 cm MITRAL VALVE                        TRICUSPID VALVE MV Area (PHT): 6.37 cm             TR Peak grad:   32.7 mmHg MV PHT:        34.51 msec           TR Vmax:        286.00 cm/s MV Decel Time: 119 msec MR Peak  grad: 75.7 mmHg             SHUNTS MR Mean grad: 46.0 mmHg             Systemic VTI:  0.07 m MR Vmax:      435.00 cm/s           Systemic Diam: 2.20 cm MR Vmean:     321.0 cm/s MV E velocity: 89.50 cm/s 103 cm/s MV A velocity: 34.70 cm/s 70.3 cm/s MV E/A ratio:  2.58       1.5  Cherlynn Kaiser MD Electronically signed by Cherlynn Kaiser MD Signature Date/Time: 09/27/2019/12:44:34 PM    Final     Medications: Infusions: .  prismasol BGK 4/2.5 500 mL/hr at 09/09/19 5945  .  prismasol BGK 4/2.5    . sodium chloride Stopped (09/25/2019 0914)  .  ceFAZolin (ANCEF) IV Stopped  (09/08/19 2330)  . dexmedetomidine (PRECEDEX) IV infusion 1.2 mcg/kg/hr (09/09/19 0600)  . dextrose 30 mL/hr at 09/09/19 0600  . DOBUTamine 5 mcg/kg/min (09/09/19 0600)  . feeding supplement (VITAL 1.5 CAL) Stopped (09/08/19 2100)  . norepinephrine (LEVOPHED) Adult infusion 30 mcg/min (09/09/19 0600)  . prismasol BGK 4/2.5 1,500 mL/hr at 09/09/19 0711  . sodium bicarbonate 150 mEq in dextrose 5% 1000 mL 100 mL/hr at 09/09/19 0600  . vasopressin (PITRESSIN) infusion - *FOR SHOCK*      Scheduled Medications: . sodium chloride   Intravenous Once  . sodium chloride   Intravenous Once  . chlorhexidine gluconate (MEDLINE KIT)  15 mL Mouth Rinse BID  . Chlorhexidine Gluconate Cloth  6 each Topical Q0600  . Chlorhexidine Gluconate Cloth  6 each Topical Daily  . feeding supplement (PRO-STAT SUGAR FREE 64)  30 mL Per Tube QID  . Gerhardt's butt cream   Topical TID  . mouth rinse  15 mL Mouth Rinse 10 times per day  . mupirocin ointment  1 application Nasal BID  . sodium chloride flush  10-40 mL Intracatheter Q12H    have reviewed scheduled and prn medications.  Physical Exam: General: Critically ill looking male, intubated and not responding Heart:RRR, s1s2 nl Lungs: Coarse breath bilateral Abdomen:soft, non-distended Extremities: Bilateral LE edema Dialysis Access: Temporary IJ catheter.  Bayla Mcgovern Tanna Furry 09/09/2019,8:05 AM  LOS: 4 days  Pager: [phone]

## 2019-09-09 NOTE — Progress Notes (Signed)
Appreciate CCM management of patient.  FPTS will be happy to resume care once patient more stable and able to transfer to floor.  Oronde Hallenbeck MD PGY1 

## 2019-09-10 ENCOUNTER — Inpatient Hospital Stay (HOSPITAL_COMMUNITY): Payer: Medicaid Other

## 2019-09-13 LAB — CULTURE, BLOOD (ROUTINE X 2)
Culture: NO GROWTH
Culture: NO GROWTH

## 2019-10-03 NOTE — Progress Notes (Signed)
Patient Asystole @ 0200. Wife, Rushie Goltz, called @ 0121 letting her know that his BP was continuously dropping and she was en route to hospital when patient passed.  Wife is now at bedside with hospital chaplain.

## 2019-10-03 NOTE — Discharge Summary (Signed)
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  --  19*  --  18*  GLUCOSE 38*   < > 139*  --  121*  --  34*  --  152*  --  156*  --  86  BUN 46*   < > 50*  --  61*  --  63*  --  71*  --  59*  --  44*  CREATININE 1.60*   < > 1.58*  --  1.90*  --  2.17*  --  2.28*  --  1.86*  --  1.58*  CALCIUM 8.4*   < > 7.5*  --  7.5*  --  8.2*  --  7.1*  --  6.7*  --  6.8*  MG 1.9  --  1.7  --   --   --   --   --   --   --  2.1  --   --   PHOS 4.2  --   --   --   --   --   --   --   --   --  3.7  --  3.7   < > = values in this interval not displayed.   Liver Function Tests: Recent Labs  Lab 09/20/2019 1303 09/06/19 0330 15-Dec-2019 1233 09/09/19 0427 09/09/19 1600  AST 137* 97* 112*  --   --   ALT 92* 68* 73*  --   --   ALKPHOS 144* 98 109  --   --   BILITOT 6.0* 5.8* 7.5*  --   --   PROT 7.1 5.5* 6.0*  --   --   ALBUMIN 1.5* 1.0* 1.5* 1.2* 1.2*   No results for  input(s): LIPASE, AMYLASE in the last 168 hours. Recent Labs  Lab  10/02/2019 1341  AMMONIA 19   CBC: Recent Labs  Lab 09/08/2019 1303 09/26/2019 1303 09/06/19 0330 09/06/19 2211 09/23/2019 0323 09/25/2019 0947 09/21/2019 1233 09/18/2019 1235 09/23/2019 1508 09/08/19 2154 09/09/19 1138  WBC 10.9*  --  5.6  --  9.2  --  9.3  --   --   --   --   NEUTROABS 9.7*  --   --   --   --   --   --   --   --   --   --   HGB 11.2*   < > 9.2*   < > 9.0*   < > 8.1* 8.8* 9.5* 8.8* 9.5*  HCT 33.6*   < > 27.2*   < > 26.1*   < > 24.5* 26.0* 28.0* 26.0* 28.0*  MCV 75.0*  --  73.9*  --  72.3*  --  75.6*  --   --   --   --   PLT 133*  --  80*  --  92*  --  113*  --   --   --   --    < > = values in this interval not displayed.   Cardiac Enzymes: No results for input(s): CKTOTAL, CKMB, CKMBINDEX, TROPONINI in the last 168 hours. Sepsis Labs: Recent Labs  Lab 09/11/2019 1303 09/17/2019 1833 09/06/19 0040 09/06/19 0330 09/06/19 1602 09/23/2019 0323 09/28/2019 1230 09/13/2019 1233  WBC 10.9*  --   --  5.6  --  9.2  --  9.3  LATICACIDVEN 4.1*   < > 7.3* 7.3* 6.0*  --  6.4*  --    < > = values in this interval not displayed.    Durel Salts, MD Pulmonary and Critical Care Medicine [facility] Pager: [phone] Office:[phone]

## 2019-10-03 DEATH — deceased

## 2019-10-18 ENCOUNTER — Encounter: Payer: Self-pay | Admitting: *Deleted

## 2021-09-05 IMAGING — US US ABDOMEN LIMITED
1 series · 14 of 25 positions shown · non-contrast
Comparison: None.

CLINICAL DATA: Altered mental status. Right upper quadrant
fullness.

EXAM:
ULTRASOUND ABDOMEN LIMITED RIGHT UPPER QUADRANT

[Series 1: us abdomen limited · 14 of 39 slices shown]
[im 1/39]
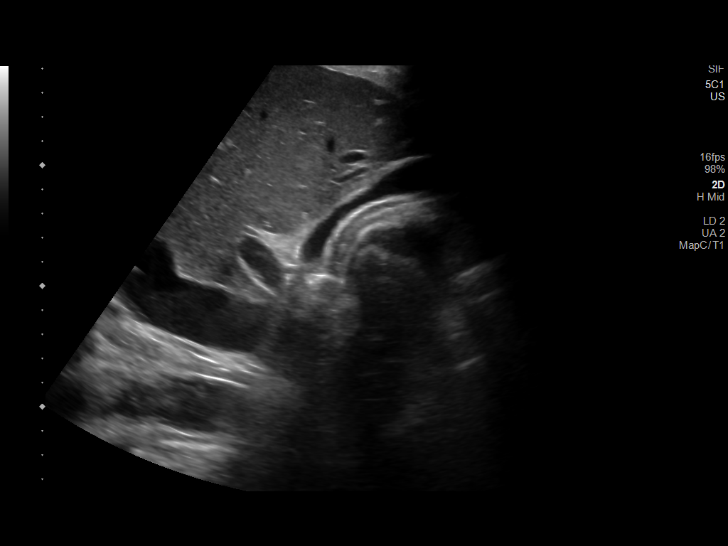
[im 4/39]
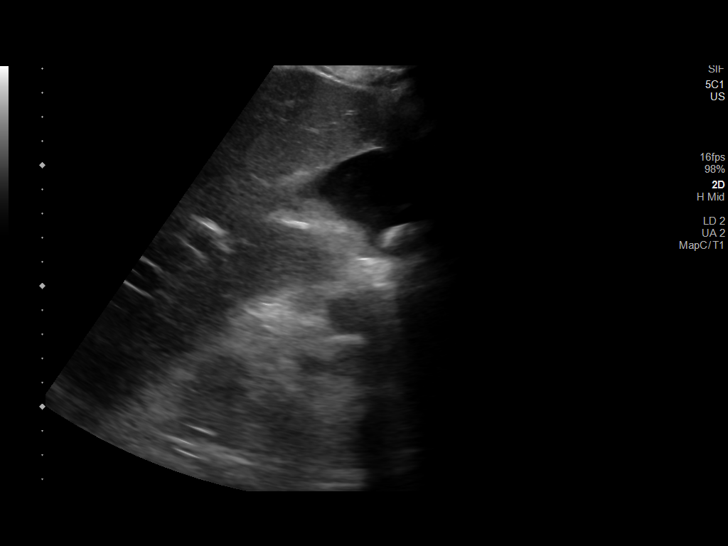
[im 7/39]
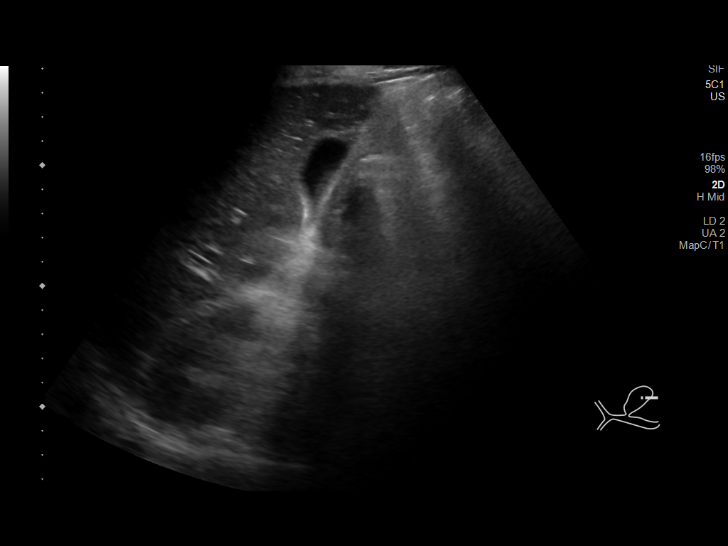
[im 10/39]
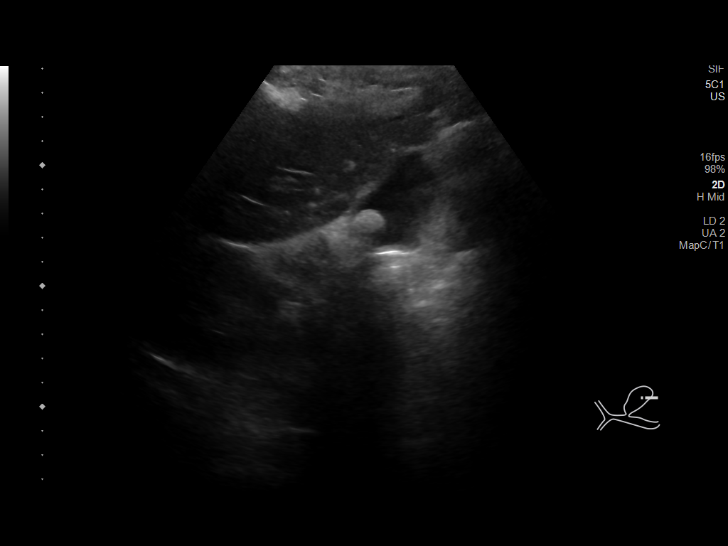
[im 13/39]
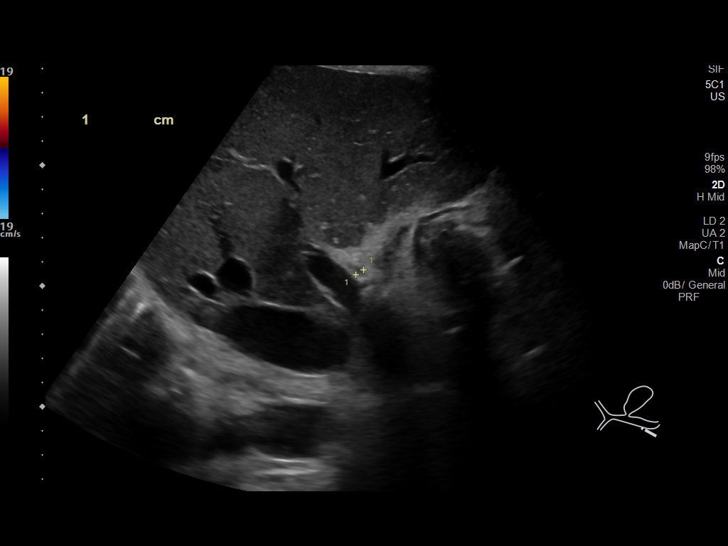
[im 15/39]
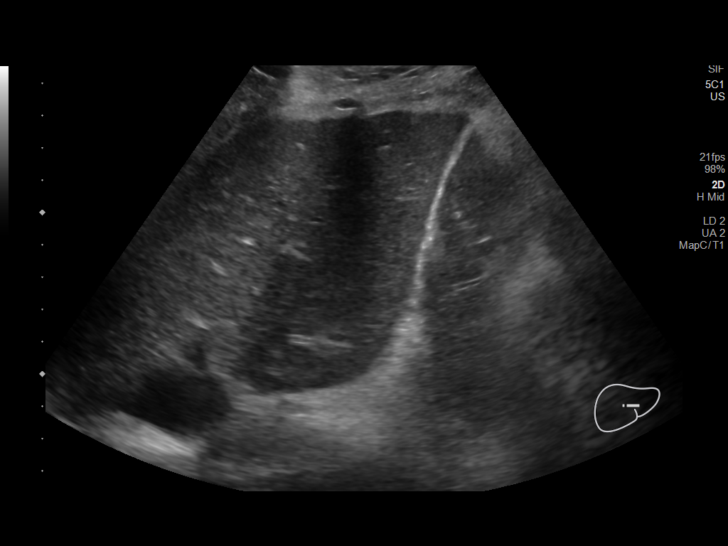
[im 18/39]
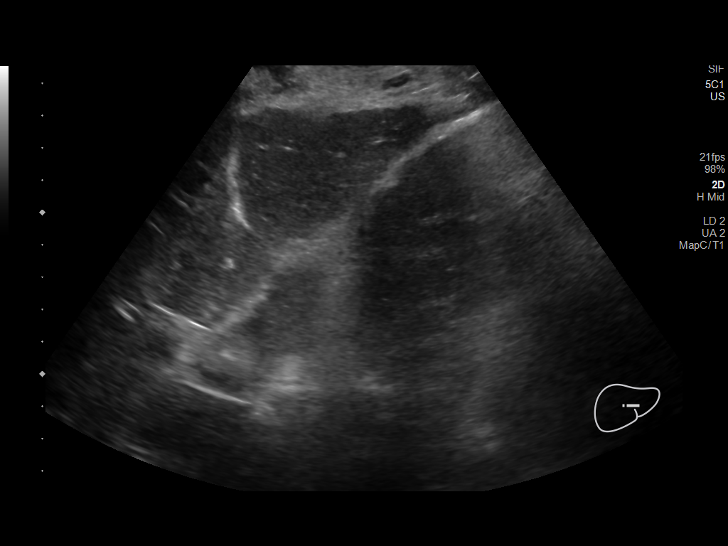
[im 21/39]
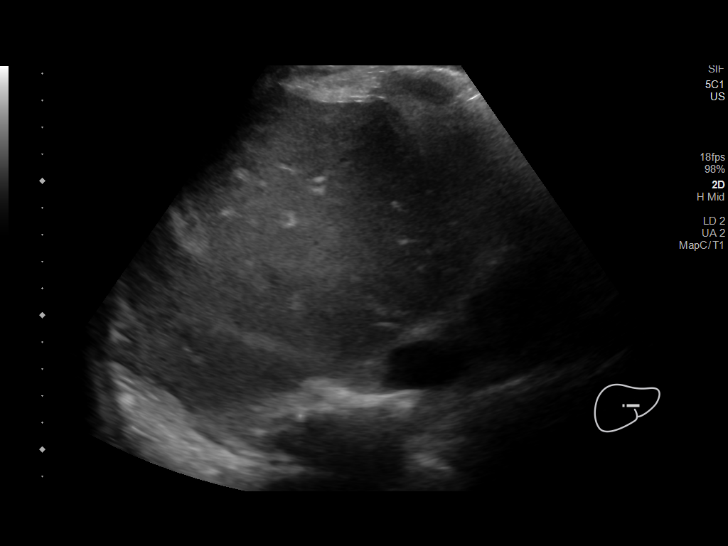
[im 24/39]
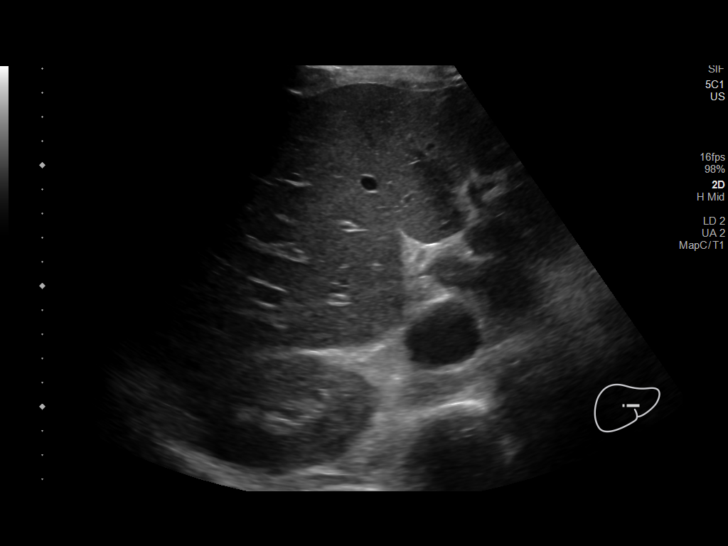
[im 26/39]
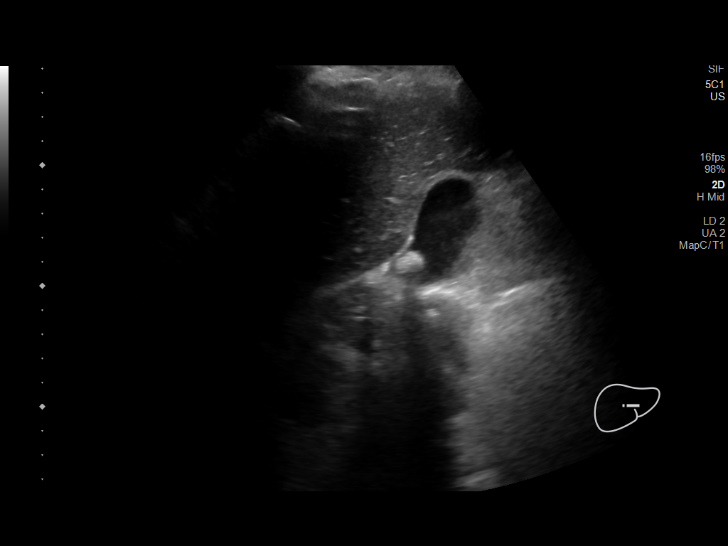
[im 29/39]
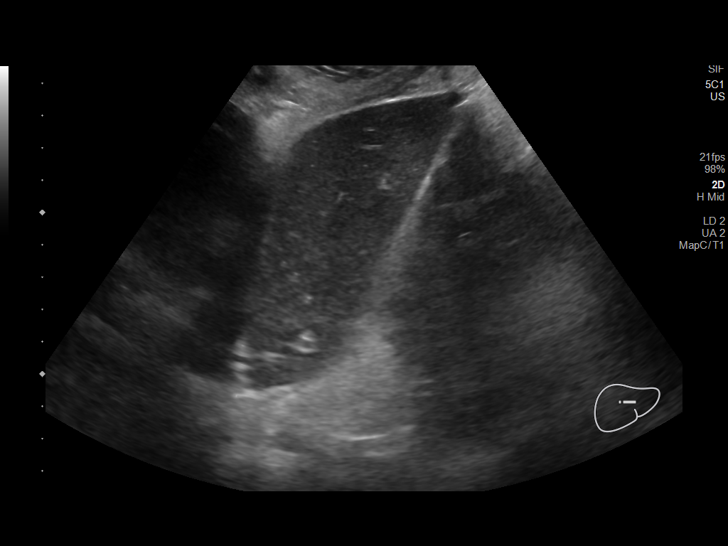
[im 32/39]
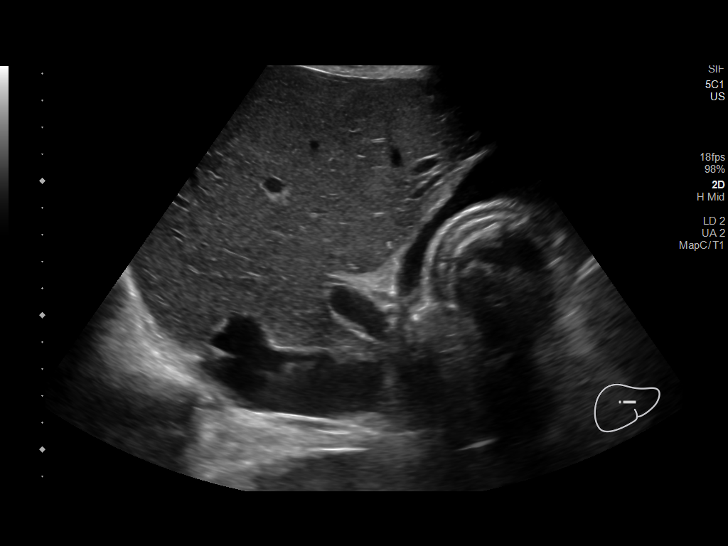
[im 35/39]
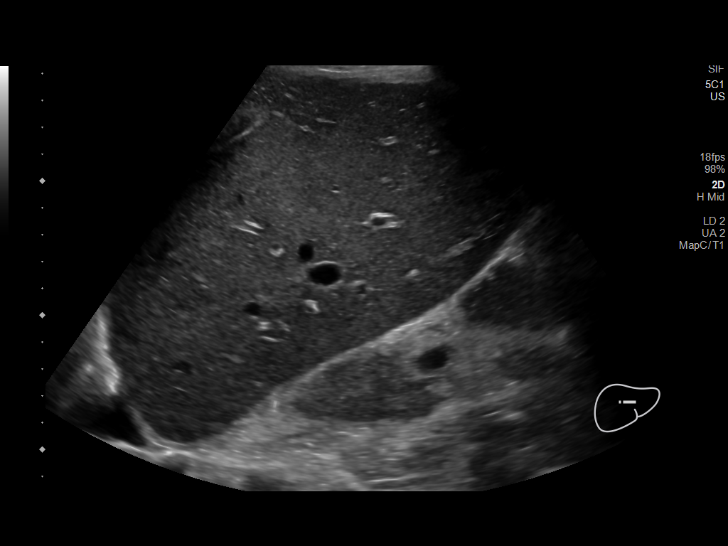
[im 39/39]
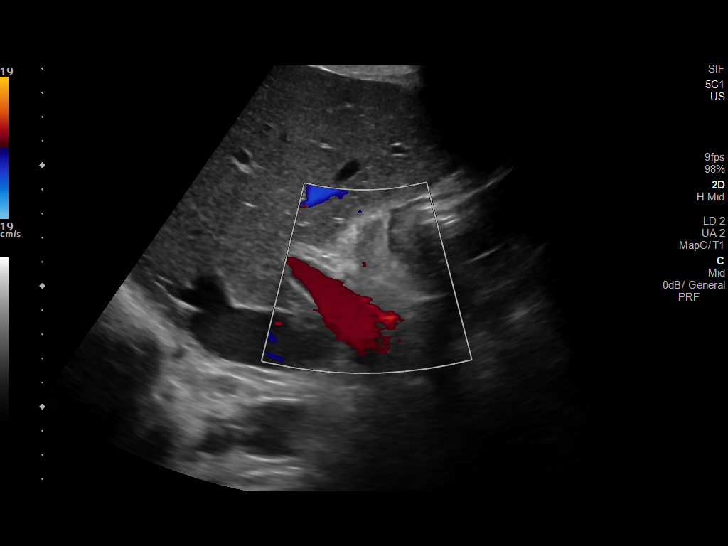

[14 of 25 positions shown; findings below may reference images not displayed]

FINDINGS: Gallbladder:

The gallbladder wall measures approximately 3 mm. There is no
pericholecystic free fluid. There is cholelithiasis with a dominant
1.8 cm stone. The sonographic Murphy sign cannot be adequately
assessed.

Common bile duct:

Diameter: 4 mm

Liver:

No focal lesion identified. Within normal limits in parenchymal
echogenicity. Portal vein is patent on color Doppler imaging with
normal direction of blood flow towards the liver.

Other: None.
IMPRESSION: 1. Cholelithiasis without definite sonographic evidence for acute
cholecystitis. However, evaluation was limited as the sonographic
Murphy sign could not be adequately assessed in this patient.
2. Normal appearing common bile duct where visualized.

## 2021-09-06 IMAGING — DX DG CHEST 1V PORT
1 series · 1 of 1 positions shown · non-contrast
Comparison: Chest radiograph 09/05/2019

CLINICAL DATA: Central line placement

EXAM:
PORTABLE CHEST 1 VIEW

[chest]
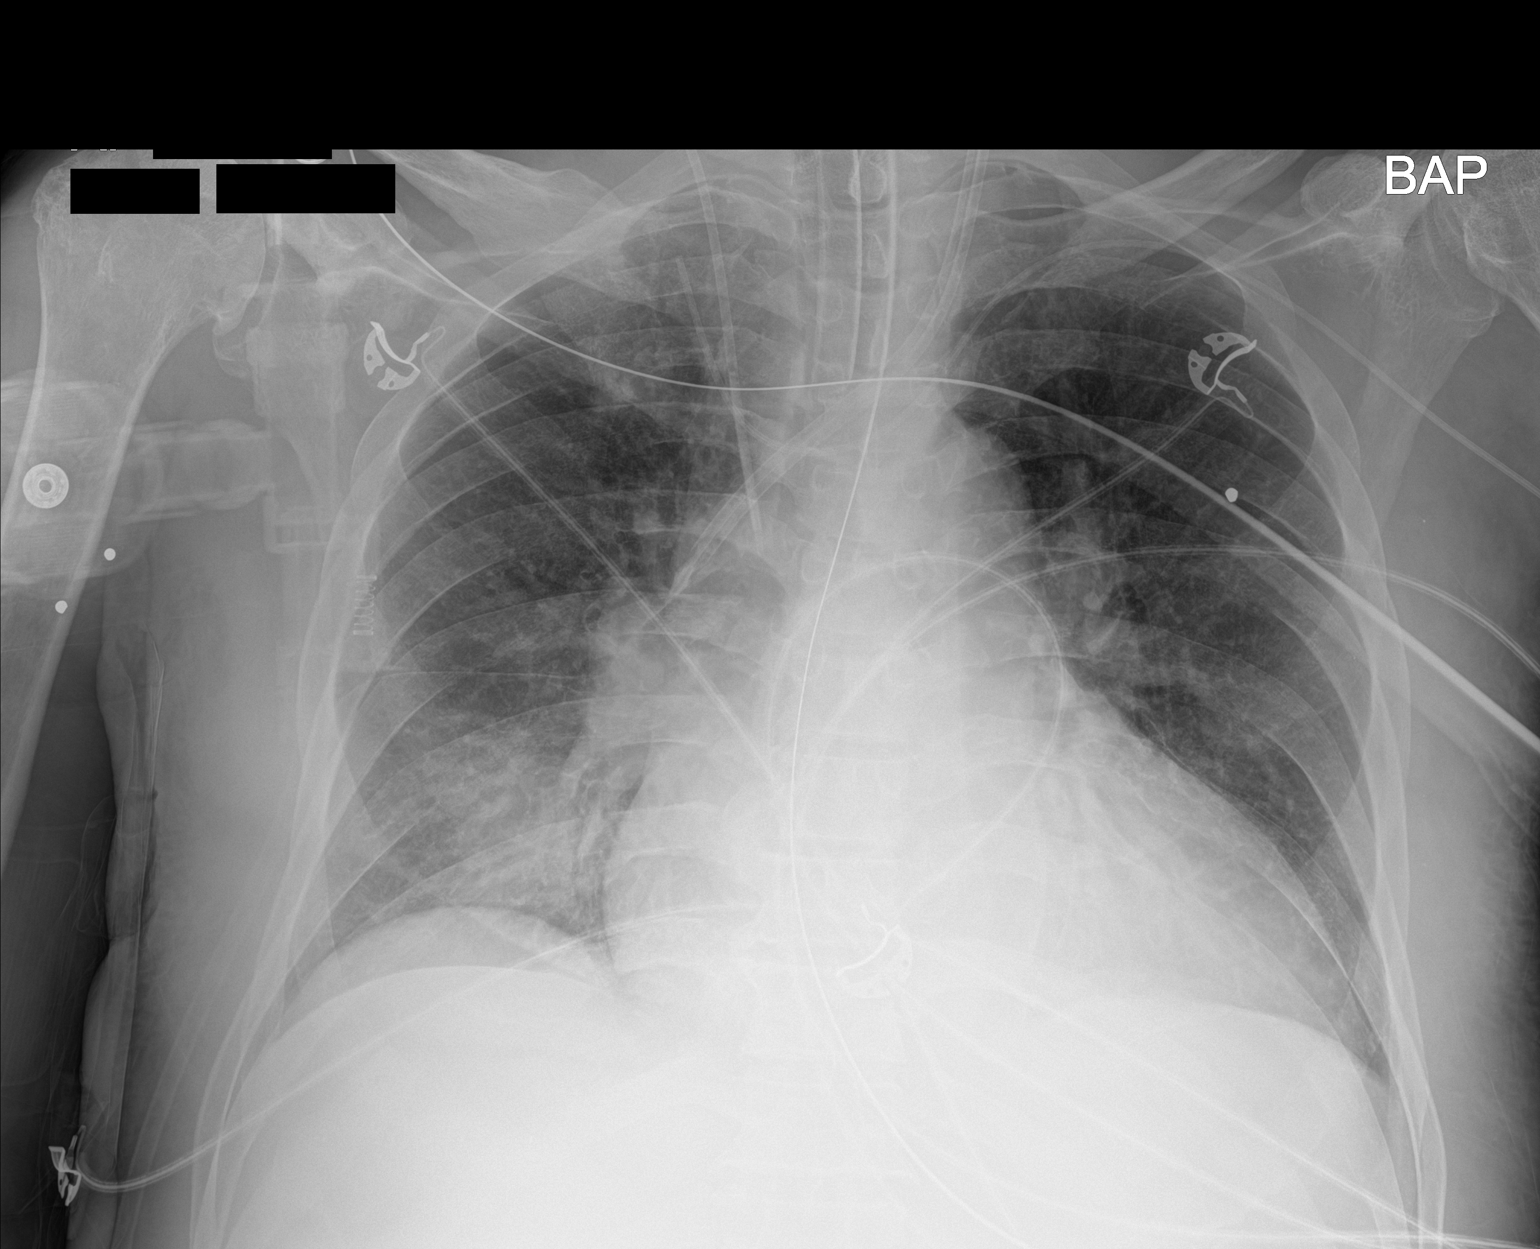

[1 of 1 positions shown; findings below may reference images not displayed]

FINDINGS: Support Apparatus:

--Endotracheal tube: Tip at the level of the clavicular heads.

--Enteric tube:Tip and sideport are below the field of view.

--Catheter(s):There is a right IJ approach central venous catheter
with tip in the lower SVC. Dual lumen catheter from a left IJ
approach also terminates in the lower right HUYHUA. Tip of a left
approach PICC line is oriented superiorly within the right
brachiocephalic vein.

--Other: None

Hazy opacities at the right lung base. Mild cardiomegaly. No
pneumothorax or sizable pleural effusion.
IMPRESSION: 1. Left approach PICC line tip is oriented superiorly within the
right brachiocephalic vein. Recommend repositioning.
2. Hazy opacities at the right lung base may indicate developing
consolidation.
